# Patient Record
Sex: Female | Born: 1965 | Race: White | Hispanic: No | Smoking: Current every day smoker
Health system: Southern US, Community
[De-identification: ages and names within clinical notes are randomized; demographics above are authoritative.]

## PROBLEM LIST (undated history)

## (undated) DIAGNOSIS — K219 Gastro-esophageal reflux disease without esophagitis: Secondary | ICD-10-CM

## (undated) DIAGNOSIS — R55 Syncope and collapse: Secondary | ICD-10-CM

## (undated) DIAGNOSIS — G250 Essential tremor: Secondary | ICD-10-CM

## (undated) DIAGNOSIS — F32A Depression, unspecified: Secondary | ICD-10-CM

## (undated) DIAGNOSIS — T7840XA Allergy, unspecified, initial encounter: Secondary | ICD-10-CM

## (undated) DIAGNOSIS — C801 Malignant (primary) neoplasm, unspecified: Secondary | ICD-10-CM

## (undated) DIAGNOSIS — Z85828 Personal history of other malignant neoplasm of skin: Secondary | ICD-10-CM

## (undated) DIAGNOSIS — F419 Anxiety disorder, unspecified: Secondary | ICD-10-CM

## (undated) DIAGNOSIS — I10 Essential (primary) hypertension: Secondary | ICD-10-CM

## (undated) DIAGNOSIS — E785 Hyperlipidemia, unspecified: Secondary | ICD-10-CM

## (undated) DIAGNOSIS — D0472 Carcinoma in situ of skin of left lower limb, including hip: Secondary | ICD-10-CM

## (undated) DIAGNOSIS — F329 Major depressive disorder, single episode, unspecified: Secondary | ICD-10-CM

## (undated) HISTORY — DX: Allergy, unspecified, initial encounter: T78.40XA

## (undated) HISTORY — DX: Gastro-esophageal reflux disease without esophagitis: K21.9

## (undated) HISTORY — DX: Hyperlipidemia, unspecified: E78.5

## (undated) HISTORY — PX: OTHER SURGICAL HISTORY: SHX169

## (undated) HISTORY — DX: Essential tremor: G25.0

## (undated) HISTORY — DX: Syncope and collapse: R55

## (undated) HISTORY — DX: Anxiety disorder, unspecified: F41.9

## (undated) HISTORY — DX: Depression, unspecified: F32.A

## (undated) HISTORY — DX: Essential (primary) hypertension: I10

## (undated) HISTORY — DX: Major depressive disorder, single episode, unspecified: F32.9

## (undated) HISTORY — DX: Personal history of other malignant neoplasm of skin: Z85.828

## (undated) HISTORY — DX: Carcinoma in situ of skin of left lower limb, including hip: D04.72

---

## 2002-03-16 ENCOUNTER — Other Ambulatory Visit: Admission: RE | Admit: 2002-03-16 | Discharge: 2002-03-16 | Payer: Self-pay | Admitting: Internal Medicine

## 2003-05-24 ENCOUNTER — Encounter: Payer: Self-pay | Admitting: Internal Medicine

## 2004-08-25 ENCOUNTER — Other Ambulatory Visit: Admission: RE | Admit: 2004-08-25 | Discharge: 2004-08-25 | Payer: Self-pay | Admitting: Internal Medicine

## 2004-09-28 ENCOUNTER — Ambulatory Visit: Payer: Self-pay | Admitting: Internal Medicine

## 2005-02-17 ENCOUNTER — Ambulatory Visit: Payer: Self-pay | Admitting: Internal Medicine

## 2005-03-10 ENCOUNTER — Ambulatory Visit: Payer: Self-pay | Admitting: Internal Medicine

## 2005-03-24 ENCOUNTER — Ambulatory Visit: Payer: Self-pay | Admitting: Internal Medicine

## 2005-09-24 ENCOUNTER — Ambulatory Visit: Payer: Self-pay | Admitting: Internal Medicine

## 2005-10-01 ENCOUNTER — Ambulatory Visit: Payer: Self-pay | Admitting: Internal Medicine

## 2006-01-06 ENCOUNTER — Ambulatory Visit: Payer: Self-pay | Admitting: Family Medicine

## 2006-01-24 ENCOUNTER — Ambulatory Visit: Payer: Self-pay | Admitting: Internal Medicine

## 2006-03-31 ENCOUNTER — Ambulatory Visit: Payer: Self-pay | Admitting: Internal Medicine

## 2006-03-31 ENCOUNTER — Other Ambulatory Visit: Admission: RE | Admit: 2006-03-31 | Discharge: 2006-03-31 | Payer: Self-pay | Admitting: Internal Medicine

## 2006-03-31 LAB — CONVERTED CEMR LAB: Pap Smear: NORMAL

## 2006-04-29 ENCOUNTER — Ambulatory Visit: Payer: Self-pay | Admitting: Internal Medicine

## 2006-05-05 ENCOUNTER — Ambulatory Visit: Payer: Self-pay | Admitting: Internal Medicine

## 2006-05-05 HISTORY — PX: ESOPHAGOGASTRODUODENOSCOPY: SHX1529

## 2006-07-06 ENCOUNTER — Emergency Department: Payer: Self-pay | Admitting: Emergency Medicine

## 2006-07-06 ENCOUNTER — Other Ambulatory Visit: Payer: Self-pay

## 2006-07-13 ENCOUNTER — Ambulatory Visit: Payer: Self-pay | Admitting: Internal Medicine

## 2006-10-03 ENCOUNTER — Ambulatory Visit: Payer: Self-pay | Admitting: Internal Medicine

## 2006-11-02 ENCOUNTER — Ambulatory Visit: Payer: Self-pay | Admitting: Internal Medicine

## 2007-04-03 ENCOUNTER — Ambulatory Visit: Payer: Self-pay | Admitting: Internal Medicine

## 2007-04-03 DIAGNOSIS — K219 Gastro-esophageal reflux disease without esophagitis: Secondary | ICD-10-CM | POA: Insufficient documentation

## 2007-04-03 DIAGNOSIS — J309 Allergic rhinitis, unspecified: Secondary | ICD-10-CM | POA: Insufficient documentation

## 2007-04-03 DIAGNOSIS — F329 Major depressive disorder, single episode, unspecified: Secondary | ICD-10-CM | POA: Insufficient documentation

## 2007-04-03 DIAGNOSIS — G43009 Migraine without aura, not intractable, without status migrainosus: Secondary | ICD-10-CM | POA: Insufficient documentation

## 2007-04-03 DIAGNOSIS — I10 Essential (primary) hypertension: Secondary | ICD-10-CM | POA: Insufficient documentation

## 2007-05-03 ENCOUNTER — Telehealth (INDEPENDENT_AMBULATORY_CARE_PROVIDER_SITE_OTHER): Payer: Self-pay | Admitting: *Deleted

## 2007-07-14 ENCOUNTER — Telehealth (INDEPENDENT_AMBULATORY_CARE_PROVIDER_SITE_OTHER): Payer: Self-pay | Admitting: *Deleted

## 2007-11-28 ENCOUNTER — Telehealth (INDEPENDENT_AMBULATORY_CARE_PROVIDER_SITE_OTHER): Payer: Self-pay | Admitting: *Deleted

## 2007-12-01 ENCOUNTER — Telehealth (INDEPENDENT_AMBULATORY_CARE_PROVIDER_SITE_OTHER): Payer: Self-pay | Admitting: *Deleted

## 2007-12-20 ENCOUNTER — Other Ambulatory Visit: Admission: RE | Admit: 2007-12-20 | Discharge: 2007-12-20 | Payer: Self-pay | Admitting: Internal Medicine

## 2007-12-20 ENCOUNTER — Encounter: Payer: Self-pay | Admitting: Internal Medicine

## 2007-12-20 ENCOUNTER — Ambulatory Visit: Payer: Self-pay | Admitting: Internal Medicine

## 2007-12-20 DIAGNOSIS — E785 Hyperlipidemia, unspecified: Secondary | ICD-10-CM | POA: Insufficient documentation

## 2007-12-20 DIAGNOSIS — L570 Actinic keratosis: Secondary | ICD-10-CM | POA: Insufficient documentation

## 2007-12-22 ENCOUNTER — Encounter (INDEPENDENT_AMBULATORY_CARE_PROVIDER_SITE_OTHER): Payer: Self-pay | Admitting: *Deleted

## 2007-12-22 ENCOUNTER — Encounter: Payer: Self-pay | Admitting: Internal Medicine

## 2007-12-22 LAB — CONVERTED CEMR LAB: Pap Smear: NORMAL

## 2007-12-26 ENCOUNTER — Encounter (INDEPENDENT_AMBULATORY_CARE_PROVIDER_SITE_OTHER): Payer: Self-pay | Admitting: *Deleted

## 2008-01-29 ENCOUNTER — Telehealth (INDEPENDENT_AMBULATORY_CARE_PROVIDER_SITE_OTHER): Payer: Self-pay | Admitting: *Deleted

## 2008-01-31 ENCOUNTER — Encounter: Payer: Self-pay | Admitting: Internal Medicine

## 2008-03-22 ENCOUNTER — Telehealth (INDEPENDENT_AMBULATORY_CARE_PROVIDER_SITE_OTHER): Payer: Self-pay | Admitting: *Deleted

## 2008-03-22 HISTORY — PX: LAPAROSCOPIC CHOLECYSTECTOMY: SUR755

## 2008-04-02 ENCOUNTER — Ambulatory Visit: Payer: Self-pay | Admitting: Internal Medicine

## 2008-04-04 ENCOUNTER — Telehealth (INDEPENDENT_AMBULATORY_CARE_PROVIDER_SITE_OTHER): Payer: Self-pay | Admitting: *Deleted

## 2008-04-10 ENCOUNTER — Encounter (INDEPENDENT_AMBULATORY_CARE_PROVIDER_SITE_OTHER): Payer: Self-pay | Admitting: *Deleted

## 2008-05-07 ENCOUNTER — Telehealth (INDEPENDENT_AMBULATORY_CARE_PROVIDER_SITE_OTHER): Payer: Self-pay | Admitting: *Deleted

## 2008-05-20 ENCOUNTER — Telehealth (INDEPENDENT_AMBULATORY_CARE_PROVIDER_SITE_OTHER): Payer: Self-pay | Admitting: *Deleted

## 2008-05-20 ENCOUNTER — Telehealth: Payer: Self-pay | Admitting: Family Medicine

## 2008-06-10 ENCOUNTER — Telehealth (INDEPENDENT_AMBULATORY_CARE_PROVIDER_SITE_OTHER): Payer: Self-pay | Admitting: *Deleted

## 2008-06-19 ENCOUNTER — Ambulatory Visit: Payer: Self-pay | Admitting: Internal Medicine

## 2008-07-25 ENCOUNTER — Telehealth: Payer: Self-pay | Admitting: Internal Medicine

## 2008-09-13 ENCOUNTER — Telehealth: Payer: Self-pay | Admitting: Internal Medicine

## 2008-09-23 ENCOUNTER — Telehealth: Payer: Self-pay | Admitting: Internal Medicine

## 2008-10-24 ENCOUNTER — Telehealth: Payer: Self-pay | Admitting: Internal Medicine

## 2008-11-06 ENCOUNTER — Telehealth: Payer: Self-pay | Admitting: Internal Medicine

## 2008-11-28 ENCOUNTER — Telehealth: Payer: Self-pay | Admitting: Family Medicine

## 2008-12-24 ENCOUNTER — Ambulatory Visit: Payer: Self-pay | Admitting: Internal Medicine

## 2008-12-24 ENCOUNTER — Other Ambulatory Visit: Admission: RE | Admit: 2008-12-24 | Discharge: 2008-12-24 | Payer: Self-pay | Admitting: Internal Medicine

## 2008-12-24 ENCOUNTER — Encounter: Payer: Self-pay | Admitting: Internal Medicine

## 2008-12-24 LAB — CONVERTED CEMR LAB
Albumin: 3.6 g/dL (ref 3.5–5.2)
BUN: 8 mg/dL (ref 6–23)
CO2: 27 meq/L (ref 19–32)
Calcium: 8.9 mg/dL (ref 8.4–10.5)
Chloride: 109 meq/L (ref 96–112)
Creatinine, Ser: 0.8 mg/dL (ref 0.4–1.2)
GFR calc Af Amer: 101 mL/min
GFR calc non Af Amer: 83 mL/min
Glucose, Bld: 93 mg/dL (ref 70–99)
Phosphorus: 3 mg/dL (ref 2.3–4.6)
Potassium: 3.8 meq/L (ref 3.5–5.1)
Sodium: 140 meq/L (ref 135–145)

## 2008-12-24 LAB — HM PAP SMEAR

## 2008-12-26 ENCOUNTER — Encounter: Payer: Self-pay | Admitting: Internal Medicine

## 2009-01-10 ENCOUNTER — Telehealth: Payer: Self-pay | Admitting: Family Medicine

## 2009-02-03 ENCOUNTER — Telehealth: Payer: Self-pay | Admitting: Family Medicine

## 2009-02-10 ENCOUNTER — Telehealth: Payer: Self-pay | Admitting: Internal Medicine

## 2009-04-07 ENCOUNTER — Telehealth: Payer: Self-pay | Admitting: Internal Medicine

## 2009-04-28 ENCOUNTER — Telehealth: Payer: Self-pay | Admitting: Internal Medicine

## 2009-06-05 ENCOUNTER — Telehealth: Payer: Self-pay | Admitting: Internal Medicine

## 2009-06-30 ENCOUNTER — Encounter: Payer: Self-pay | Admitting: Internal Medicine

## 2009-07-01 ENCOUNTER — Telehealth: Payer: Self-pay | Admitting: Internal Medicine

## 2009-07-01 ENCOUNTER — Ambulatory Visit: Payer: Self-pay | Admitting: Internal Medicine

## 2009-07-01 LAB — CONVERTED CEMR LAB
ALT: 20 units/L (ref 0–35)
AST: 18 units/L (ref 0–37)
Albumin: 3.8 g/dL (ref 3.5–5.2)
Alkaline Phosphatase: 57 units/L (ref 39–117)
BUN: 12 mg/dL (ref 6–23)
Basophils Absolute: 0 10*3/uL (ref 0.0–0.1)
Basophils Relative: 0.1 % (ref 0.0–3.0)
Bilirubin, Direct: 0.1 mg/dL (ref 0.0–0.3)
CO2: 28 meq/L (ref 19–32)
Calcium: 9.1 mg/dL (ref 8.4–10.5)
Chloride: 107 meq/L (ref 96–112)
Creatinine, Ser: 0.8 mg/dL (ref 0.4–1.2)
Eosinophils Absolute: 0.2 10*3/uL (ref 0.0–0.7)
Eosinophils Relative: 1.7 % (ref 0.0–5.0)
Glucose, Bld: 88 mg/dL (ref 70–99)
HCT: 41.6 % (ref 36.0–46.0)
Hemoglobin: 14.5 g/dL (ref 12.0–15.0)
Lymphocytes Relative: 24.8 % (ref 12.0–46.0)
Lymphs Abs: 2.9 10*3/uL (ref 0.7–4.0)
MCHC: 34.9 g/dL (ref 30.0–36.0)
MCV: 87.3 fL (ref 78.0–100.0)
Monocytes Absolute: 0.6 10*3/uL (ref 0.1–1.0)
Monocytes Relative: 5.3 % (ref 3.0–12.0)
Neutro Abs: 7.9 10*3/uL — ABNORMAL HIGH (ref 1.4–7.7)
Neutrophils Relative %: 68.1 % (ref 43.0–77.0)
Phosphorus: 3.6 mg/dL (ref 2.3–4.6)
Platelets: 363 10*3/uL (ref 150.0–400.0)
Potassium: 4.8 meq/L (ref 3.5–5.1)
RBC: 4.77 M/uL (ref 3.87–5.11)
RDW: 12.8 % (ref 11.5–14.6)
Sodium: 140 meq/L (ref 135–145)
TSH: 0.94 microintl units/mL (ref 0.35–5.50)
Total Bilirubin: 0.5 mg/dL (ref 0.3–1.2)
Total Protein: 6.7 g/dL (ref 6.0–8.3)
WBC: 11.6 10*3/uL — ABNORMAL HIGH (ref 4.5–10.5)

## 2009-07-03 ENCOUNTER — Telehealth: Payer: Self-pay | Admitting: Internal Medicine

## 2009-07-11 ENCOUNTER — Telehealth: Payer: Self-pay | Admitting: Family Medicine

## 2009-08-15 ENCOUNTER — Telehealth: Payer: Self-pay | Admitting: Internal Medicine

## 2009-09-24 ENCOUNTER — Telehealth: Payer: Self-pay | Admitting: Internal Medicine

## 2009-10-29 ENCOUNTER — Telehealth: Payer: Self-pay | Admitting: Internal Medicine

## 2009-11-26 ENCOUNTER — Telehealth: Payer: Self-pay | Admitting: Family Medicine

## 2009-12-03 ENCOUNTER — Telehealth: Payer: Self-pay | Admitting: Internal Medicine

## 2009-12-18 ENCOUNTER — Ambulatory Visit: Payer: Self-pay | Admitting: Internal Medicine

## 2010-01-26 ENCOUNTER — Telehealth: Payer: Self-pay | Admitting: Family Medicine

## 2010-02-09 ENCOUNTER — Ambulatory Visit: Payer: Self-pay | Admitting: Internal Medicine

## 2010-02-19 ENCOUNTER — Telehealth: Payer: Self-pay | Admitting: Internal Medicine

## 2010-02-25 ENCOUNTER — Telehealth: Payer: Self-pay | Admitting: Internal Medicine

## 2010-03-31 ENCOUNTER — Telehealth: Payer: Self-pay | Admitting: Internal Medicine

## 2010-05-14 ENCOUNTER — Ambulatory Visit: Payer: Self-pay | Admitting: Internal Medicine

## 2010-05-14 DIAGNOSIS — F438 Other reactions to severe stress: Secondary | ICD-10-CM | POA: Insufficient documentation

## 2010-07-17 ENCOUNTER — Telehealth: Payer: Self-pay | Admitting: Internal Medicine

## 2010-09-09 ENCOUNTER — Telehealth: Payer: Self-pay | Admitting: Internal Medicine

## 2010-09-11 ENCOUNTER — Ambulatory Visit: Payer: Self-pay | Admitting: Internal Medicine

## 2010-09-22 ENCOUNTER — Telehealth: Payer: Self-pay | Admitting: Family Medicine

## 2010-09-22 ENCOUNTER — Telehealth: Payer: Self-pay | Admitting: Internal Medicine

## 2010-09-22 ENCOUNTER — Encounter: Payer: Self-pay | Admitting: Family Medicine

## 2010-09-22 LAB — HM MAMMOGRAPHY: HM Mammogram: NORMAL

## 2010-09-24 ENCOUNTER — Telehealth (INDEPENDENT_AMBULATORY_CARE_PROVIDER_SITE_OTHER): Payer: Self-pay | Admitting: *Deleted

## 2010-09-25 ENCOUNTER — Ambulatory Visit: Payer: Self-pay | Admitting: Family Medicine

## 2010-09-28 ENCOUNTER — Telehealth: Payer: Self-pay | Admitting: Internal Medicine

## 2010-09-28 LAB — CONVERTED CEMR LAB
BUN: 15 mg/dL (ref 6–23)
CO2: 27 meq/L (ref 19–32)
Calcium: 9.5 mg/dL (ref 8.4–10.5)
Chloride: 106 meq/L (ref 96–112)
Creatinine, Ser: 0.9 mg/dL (ref 0.4–1.2)
GFR calc non Af Amer: 76.88 mL/min (ref >60)
Glucose, Bld: 112 mg/dL — ABNORMAL HIGH (ref 70–99)
Potassium: 4.2 meq/L (ref 3.5–5.1)
Sodium: 139 meq/L (ref 135–145)

## 2010-10-07 ENCOUNTER — Encounter: Admission: RE | Admit: 2010-10-07 | Discharge: 2010-10-07 | Payer: Self-pay | Admitting: Internal Medicine

## 2010-10-07 ENCOUNTER — Encounter: Payer: Self-pay | Admitting: Family Medicine

## 2010-11-06 ENCOUNTER — Ambulatory Visit: Payer: Self-pay | Admitting: Family Medicine

## 2010-11-09 ENCOUNTER — Encounter: Payer: Self-pay | Admitting: Internal Medicine

## 2010-11-09 ENCOUNTER — Ambulatory Visit: Payer: Self-pay | Admitting: Family Medicine

## 2010-11-09 LAB — CONVERTED CEMR LAB
ALT: 13 units/L (ref 0–35)
AST: 15 units/L (ref 0–37)
Albumin: 3.9 g/dL (ref 3.5–5.2)
Alkaline Phosphatase: 67 units/L (ref 39–117)
Basophils Absolute: 0.2 10*3/uL — ABNORMAL HIGH (ref 0.0–0.1)
Basophils Relative: 1.5 % (ref 0.0–3.0)
Bilirubin, Direct: 0.1 mg/dL (ref 0.0–0.3)
Eosinophils Absolute: 0.3 10*3/uL (ref 0.0–0.7)
Eosinophils Relative: 2.6 % (ref 0.0–5.0)
H Pylori IgG: NEGATIVE
HCT: 42.8 % (ref 36.0–46.0)
Hemoglobin: 14.5 g/dL (ref 12.0–15.0)
Lipase: 30 units/L (ref 11.0–59.0)
Lymphocytes Relative: 23.7 % (ref 12.0–46.0)
Lymphs Abs: 2.8 10*3/uL (ref 0.7–4.0)
MCHC: 34 g/dL (ref 30.0–36.0)
MCV: 90 fL (ref 78.0–100.0)
Monocytes Absolute: 0.6 10*3/uL (ref 0.1–1.0)
Monocytes Relative: 5.2 % (ref 3.0–12.0)
Neutro Abs: 7.9 10*3/uL — ABNORMAL HIGH (ref 1.4–7.7)
Neutrophils Relative %: 67 % (ref 43.0–77.0)
Platelets: 368 10*3/uL (ref 150.0–400.0)
RBC: 4.75 M/uL (ref 3.87–5.11)
RDW: 13.2 % (ref 11.5–14.6)
Total Bilirubin: 0.5 mg/dL (ref 0.3–1.2)
Total Protein: 6.8 g/dL (ref 6.0–8.3)
WBC: 11.8 10*3/uL — ABNORMAL HIGH (ref 4.5–10.5)

## 2010-11-25 ENCOUNTER — Telehealth: Payer: Self-pay | Admitting: Internal Medicine

## 2010-12-14 ENCOUNTER — Telehealth: Payer: Self-pay | Admitting: Internal Medicine

## 2010-12-20 LAB — CONVERTED CEMR LAB
ALT: 19 units/L (ref 0–35)
AST: 17 units/L (ref 0–37)
Albumin: 3.7 g/dL (ref 3.5–5.2)
Albumin: 3.7 g/dL (ref 3.5–5.2)
Alkaline Phosphatase: 56 units/L (ref 39–117)
Amylase: 79 units/L (ref 27–131)
BUN: 11 mg/dL (ref 6–23)
BUN: 9 mg/dL (ref 6–23)
Basophils Absolute: 0.1 10*3/uL (ref 0.0–0.1)
Basophils Absolute: 0.1 10*3/uL (ref 0.0–0.1)
Basophils Relative: 0.8 % (ref 0.0–1.0)
Basophils Relative: 0.9 % (ref 0.0–1.0)
Bilirubin, Direct: 0.1 mg/dL (ref 0.0–0.3)
CO2: 29 meq/L (ref 19–32)
CO2: 30 meq/L (ref 19–32)
Calcium: 9.2 mg/dL (ref 8.4–10.5)
Calcium: 9.3 mg/dL (ref 8.4–10.5)
Chloride: 104 meq/L (ref 96–112)
Chloride: 109 meq/L (ref 96–112)
Cholesterol: 213 mg/dL (ref 0–200)
Creatinine, Ser: 0.9 mg/dL (ref 0.4–1.2)
Creatinine, Ser: 0.9 mg/dL (ref 0.4–1.2)
Direct LDL: 154.6 mg/dL
Eosinophils Absolute: 0.1 10*3/uL (ref 0.0–0.7)
Eosinophils Absolute: 0.2 10*3/uL (ref 0.0–0.6)
Eosinophils Relative: 1.7 % (ref 0.0–5.0)
Eosinophils Relative: 2.8 % (ref 0.0–5.0)
GFR calc Af Amer: 88 mL/min
GFR calc Af Amer: 88 mL/min
GFR calc non Af Amer: 73 mL/min
GFR calc non Af Amer: 73 mL/min
Glucose, Bld: 83 mg/dL (ref 70–99)
Glucose, Bld: 98 mg/dL (ref 70–99)
H Pylori IgG: NEGATIVE
HCT: 41.3 % (ref 36.0–46.0)
HCT: 42.7 % (ref 36.0–46.0)
HDL: 41.9 mg/dL (ref >39.0)
Hemoglobin: 13.5 g/dL (ref 12.0–15.0)
Hemoglobin: 14.3 g/dL (ref 12.0–15.0)
Lipase: 28 units/L (ref 11.0–59.0)
Lymphocytes Relative: 24.5 % (ref 12.0–46.0)
Lymphocytes Relative: 25.4 % (ref 12.0–46.0)
MCHC: 32.8 g/dL (ref 30.0–36.0)
MCHC: 33.5 g/dL (ref 30.0–36.0)
MCV: 87.9 fL (ref 78.0–100.0)
MCV: 88.4 fL (ref 78.0–100.0)
Monocytes Absolute: 0.5 10*3/uL (ref 0.1–1.0)
Monocytes Absolute: 0.6 10*3/uL (ref 0.2–0.7)
Monocytes Relative: 6.3 % (ref 3.0–12.0)
Monocytes Relative: 7.2 % (ref 3.0–11.0)
Neutro Abs: 5.4 10*3/uL (ref 1.4–7.7)
Neutro Abs: 5.8 10*3/uL (ref 1.4–7.7)
Neutrophils Relative %: 63.8 % (ref 43.0–77.0)
Neutrophils Relative %: 66.6 % (ref 43.0–77.0)
Phosphorus: 2.9 mg/dL (ref 2.3–4.6)
Phosphorus: 3.6 mg/dL (ref 2.3–4.6)
Platelets: 347 10*3/uL (ref 150–400)
Platelets: 373 10*3/uL (ref 150–400)
Potassium: 4.1 meq/L (ref 3.5–5.1)
Potassium: 4.5 meq/L (ref 3.5–5.1)
RBC: 4.67 M/uL (ref 3.87–5.11)
RBC: 4.86 M/uL (ref 3.87–5.11)
RDW: 12.8 % (ref 11.5–14.6)
RDW: 12.9 % (ref 11.5–14.6)
Sodium: 139 meq/L (ref 135–145)
Sodium: 141 meq/L (ref 135–145)
TSH: 1.15 microintl units/mL (ref 0.35–5.50)
Total Bilirubin: 0.4 mg/dL (ref 0.3–1.2)
Total CHOL/HDL Ratio: 5.1
Total Protein: 6.8 g/dL (ref 6.0–8.3)
Triglycerides: 47 mg/dL (ref 0–149)
VLDL: 9 mg/dL (ref 0–40)
WBC: 8.5 10*3/uL (ref 4.5–10.5)
WBC: 8.6 10*3/uL (ref 4.5–10.5)

## 2010-12-22 ENCOUNTER — Other Ambulatory Visit: Payer: Self-pay | Admitting: Internal Medicine

## 2010-12-22 ENCOUNTER — Encounter: Payer: Self-pay | Admitting: Internal Medicine

## 2010-12-22 ENCOUNTER — Ambulatory Visit
Admission: RE | Admit: 2010-12-22 | Discharge: 2010-12-22 | Payer: Self-pay | Source: Home / Self Care | Attending: Internal Medicine | Admitting: Internal Medicine

## 2010-12-22 LAB — CBC WITH DIFFERENTIAL/PLATELET
Basophils Absolute: 0 10*3/uL (ref 0.0–0.1)
Basophils Relative: 0.3 % (ref 0.0–3.0)
Eosinophils Absolute: 0.2 10*3/uL (ref 0.0–0.7)
Eosinophils Relative: 1.6 % (ref 0.0–5.0)
HCT: 42.2 % (ref 36.0–46.0)
Hemoglobin: 14.7 g/dL (ref 12.0–15.0)
Lymphocytes Relative: 21.8 % (ref 12.0–46.0)
Lymphs Abs: 2.7 10*3/uL (ref 0.7–4.0)
MCHC: 34.8 g/dL (ref 30.0–36.0)
MCV: 89.2 fl (ref 78.0–100.0)
Monocytes Absolute: 0.8 10*3/uL (ref 0.1–1.0)
Monocytes Relative: 6 % (ref 3.0–12.0)
Neutro Abs: 8.9 10*3/uL — ABNORMAL HIGH (ref 1.4–7.7)
Neutrophils Relative %: 70.3 % (ref 43.0–77.0)
Platelets: 351 10*3/uL (ref 150.0–400.0)
RBC: 4.73 Mil/uL (ref 3.87–5.11)
RDW: 13.8 % (ref 11.5–14.6)
WBC: 12.6 10*3/uL — ABNORMAL HIGH (ref 4.5–10.5)

## 2010-12-22 LAB — LIPID PANEL
Cholesterol: 231 mg/dL — ABNORMAL HIGH (ref 0–200)
HDL: 43 mg/dL (ref >39.00)
Total CHOL/HDL Ratio: 5
Triglycerides: 110 mg/dL (ref 0.0–149.0)
VLDL: 22 mg/dL (ref 0.0–40.0)

## 2010-12-22 LAB — HEPATIC FUNCTION PANEL
ALT: 64 U/L — ABNORMAL HIGH (ref 0–35)
AST: 53 U/L — ABNORMAL HIGH (ref 0–37)
Albumin: 3.9 g/dL (ref 3.5–5.2)
Alkaline Phosphatase: 85 U/L (ref 39–117)
Bilirubin, Direct: 0.2 mg/dL (ref 0.0–0.3)
Total Bilirubin: 0.4 mg/dL (ref 0.3–1.2)
Total Protein: 7.1 g/dL (ref 6.0–8.3)

## 2010-12-22 LAB — RENAL FUNCTION PANEL
Albumin: 3.9 g/dL (ref 3.5–5.2)
BUN: 9 mg/dL (ref 6–23)
CO2: 31 mEq/L (ref 19–32)
Calcium: 9.2 mg/dL (ref 8.4–10.5)
Chloride: 103 mEq/L (ref 96–112)
Creatinine, Ser: 0.8 mg/dL (ref 0.4–1.2)
GFR: 82.36 mL/min (ref >60.00)
Glucose, Bld: 87 mg/dL (ref 70–99)
Phosphorus: 3.2 mg/dL (ref 2.3–4.6)
Potassium: 4.3 mEq/L (ref 3.5–5.1)
Sodium: 140 mEq/L (ref 135–145)

## 2010-12-22 LAB — TSH: TSH: 1.2 u[IU]/mL (ref 0.35–5.50)

## 2010-12-22 LAB — LDL CHOLESTEROL, DIRECT: Direct LDL: 178.7 mg/dL

## 2010-12-22 NOTE — Progress Notes (Signed)
Summary: Vicodin  Phone Note [facility] 2008 8:24 AM  Refills Requested: Medication #1:  VICODIN 5-500 MG TABS 1 four times daily as needed severe pain   Last Refilled: 10/24/2008 e-scribe [facility] [phone]   Method Requested: Telephone to Pharmacy Initial call taken by: Mervin Hack CMA,  November 28, 2008 8:25 AM  Follow-up for Phone Call        per prescription in computer last rx sent on 12/09 and she had one refill. Has she already refilled so soon?  120 tabs in 1 month?  If so this nbeeds to await Dr. Karle Starch return.  Follow-up by: Kerby Nora MD,  November 28, 2008 8:31 AM  Additional Follow-up for Phone Call Additional follow up Details #1::        spoke with pharmacist, they didn't add the 1 refill, which they will do now, they made the mistake, Additional Follow-up by: Mervin Hack CMA,  November 28, 2008 9:11 AM

## 2010-12-22 NOTE — Progress Notes (Signed)
Summary: VICODIN  Phone Note Refill Request Message from:  [facility] [phone] on Mar 31, 2010 1:19 PM  Refills Requested: Medication #1:  VICODIN 5-500 MG TABS 1 four times daily as needed severe pain   Last Refilled: 02/23/2010 E-Scribe Request    Method Requested: Telephone to Pharmacy Initial call taken by: Mervin Hack CMA Duncan Dull),  Mar 31, 2010 1:19 PM  Follow-up for Phone Call        okay #60 x 0 Follow-up by: Cindee Salt MD,  Mar 31, 2010 1:56 PM  Additional Follow-up for Phone Call Additional follow up Details #1::        Rx called to pharmacy Additional Follow-up by: DeShannon Smith CMA Duncan Dull),  Mar 31, 2010 2:52 PM    Prescriptions: VICODIN 5-500 MG TABS (HYDROCODONE-ACETAMINOPHEN) 1 four times daily as needed severe pain  #60 x 0   Entered by:   Mervin Hack CMA (AAMA)   Authorized by:   Cindee Salt MD   Signed by:   Mervin Hack CMA (AAMA) on 03/31/2010   Method used:   Telephoned to ...       [facility] [address] [phone]* (retail)       [address]       [place], [place]  [address]       Ph: [phone]       Fax: [phone]   RxID:   [id number]

## 2010-12-22 NOTE — Assessment & Plan Note (Signed)
Summary: CHECK BREASTS, ONE IS LARGER   Vital Signs:  Patient profile:   45 year old female Weight:      145.25 pounds Temp:     98.4 degrees F oral Pulse rate:   84 / minute Pulse rhythm:   regular BP sitting:   140 / 100  (left arm) Cuff size:   regular  Vitals Entered By: Selena Batten Dance CMA Duncan Dull) (September 11, 2010 3:37 PM) CC: Check breasts (Asymmetrical)  Comments **has not had B/P meds today   History of Present Illness: CC: check breasts  L breast is bigger than right.  Noticed now for 2-3 wks.  No nipple discharge. No pain in breasts.  very intermittent sharp pain left breast occasionally, not associated with cycle.  Hasn't noticed any lumps or bumps (doesn't check).  No changes in exercises.  no skin changes.  Last complete physical a few years back.  Last mammo a few years back, due.  on OCP, no chance could be pregnant (LMP 09/04/2010).  h/o HTN - didn't take meds this am  husband smokes, inside.  Pt quit smoking 1987.  No fmhx BRCA.   main concern is that she has breast cancer because of everything in news this month.  -  Date:  01/31/2008    Mammogram Birads-1  Current Medications (verified): 1)  Lo/ovral (28) 0.3-30 Mg-Mcg Tabs (Norgestrel-Ethinyl Estradiol) .... Take One By Mouth Daily 2)  Fluoxetine Hcl 40 Mg Caps (Fluoxetine Hcl) .... Take One By Mouth Daily 3)  Trazodone Hcl 50 Mg Tabs (Trazodone Hcl) .... 2-3 At Bedtime Nightly To Help Sleep 4)  Amlodipine Besylate 5 Mg Tabs (Amlodipine Besylate) .... Take 1 Tablet By Mouth Once A Day 5)  Vicodin 5-500 Mg Tabs (Hydrocodone-Acetaminophen) .Marland Kitchen.. 1 Four Times Daily As Needed Severe Pain 6)  Prilosec 20 Mg  Cpdr (Omeprazole) .... Take 1 Tablet By Mouth Two Times A Day 7)  Fexofenadine Hcl 180 Mg  Tabs (Fexofenadine Hcl) .... Take 1 Tablet By Mouth Once Daily As Needed 8)  Lorazepam 1 Mg Tabs (Lorazepam) .... Take 1 By Mouth Two Times A Day As Needed Nerves  Allergies: 1)  ! 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   Chest Wall:  No deformities, masses, or tenderness noted. Breasts:  No mass, nodules, thickening, tenderness, bulging, retraction, inflamation, nipple discharge or skin changes noted.  No axillary LAD.  L breast tissue larger than right.   Impression & Recommendations:  Problem # 1:  BREAST HYPERTROPHY (ICD-611.1) reassured.  exam WNL.  L breast is larger.  no erythema or evidence of blocked duct/infection/inflammation.  due for mammogram, set up for that (screening only). rec return if any questions.  discussed sometimes breast tissue becomes more sensitive to female hormone levels (E, P, prolactin).  if continued, consider checking prolactin level, Upreg  Problem # 2:  OTHER SCREENING MAMMOGRAM (ICD-V76.12) due.  schedule appt.  last mammo 01/2008, normal.  Orders: Radiology Referral (Radiology)  Complete Medication List: 1)  Lo/ovral (28) 0.3-30 Mg-mcg Tabs (Norgestrel-ethinyl estradiol) .... Take one by mouth daily 2)  Fluoxetine Hcl 40  Mg Caps (Fluoxetine hcl) .... Take one by mouth daily 3)  Trazodone Hcl 50 Mg Tabs (Trazodone hcl) .... 2-3 at bedtime nightly to help sleep 4)  Amlodipine Besylate 5 Mg Tabs (Amlodipine besylate) .... Take 1 tablet by mouth once a day 5)  Vicodin 5-500 Mg Tabs (Hydrocodone-acetaminophen) .Marland Kitchen.. 1 four times daily as needed severe pain 6)  Prilosec 20 Mg Cpdr (Omeprazole) .... Take 1 tablet by mouth two times a day 7)  Fexofenadine Hcl 180 Mg Tabs (Fexofenadine hcl) .... Take 1 tablet by  mouth once daily as needed 8)  Lorazepam 1 Mg Tabs (Lorazepam) .... Take 1 by mouth two times a day as needed nerves  Patient Instructions: 1)  exam is looking normal today. 2)  you are due for mammogram so we will set you up for that. 3)  Call clinic with questions.  Good to see you today.   Orders Added: 1)  Est. Patient Level III [01601] 2)  Radiology Referral [Radiology]    Current Allergies (reviewed today): ! * WELLBUTRIN XL * EFFEXOR REMERON (MIRTAZAPINE) LISINOPRIL (LISINOPRIL)  Prevention & Chronic Care Immunizations   Influenza vaccine: Not documented    Tetanus booster: 12/20/2007: Tdap    Pneumococcal vaccine: Not documented  Other Screening   Pap smear: NEGATIVE FOR INTRAEPITHELIAL LESIONS OR MALIGNANCY.  (12/24/2008)    Mammogram: Birads-1  (01/31/2008)   Smoking status: quit  (04/03/2007)  Lipids   Total Cholesterol: 213  (12/20/2007)   LDL: DEL  (12/20/2007)   LDL Direct: 154.6  (12/20/2007)   HDL: 41.9  (12/20/2007)   Triglycerides: 47  (12/20/2007)    SGOT (AST): 18  (07/01/2009)   SGPT (ALT): 20  (07/01/2009)   Alkaline phosphatase: 57  (07/01/2009)   Total bilirubin: 0.5  (07/01/2009)  Hypertension   Last Blood Pressure: 140 / 100  (09/11/2010)   Serum creatinine: 0.8  (07/01/2009)   Serum potassium 4.8  (07/01/2009)  Self-Management Support :    Hypertension self-management support: Not documented    Lipid self-management support: Not documented

## 2010-12-22 NOTE — Progress Notes (Signed)
Summary: LORAZEPAM  Phone Note Refill Request Message from:  [facility] [phone] on February 03, 2009 11:10 AM  Refills Requested: Medication #1:  LORAZEPAM 0.5 MG TABS Take 1/2-1 by mouth bid   Last Refilled: 11/28/2008 e-scribe request   Method Requested: Telephone to Pharmacy Initial call taken by: Mervin Hack CMA,  February 03, 2009 11:10 AM  Follow-up for Phone Call        ok, 1 month supply Follow-up by: Hannah Beat MD,  February 03, 2009 11:18 AM  Additional Follow-up for Phone Call Additional follow up Details #1::        Rx called to pharmacy Additional Follow-up by: Mervin Hack CMA,  February 03, 2009 11:37 AM      Prescriptions: LORAZEPAM 0.5 MG TABS (LORAZEPAM) Take 1/2-1 by mouth bid  #60 x 0   Entered by:   Mervin Hack CMA   Authorized by:   Hannah Beat MD   Signed by:   Mervin Hack CMA on 02/03/2009   Method used:   Telephoned to ...       [facility] [address] [phone]* (retail)       [address]       [place], [place]  [address]       Ph: [phone]       Fax: [phone]   RxID:   [id number]

## 2010-12-22 NOTE — Progress Notes (Signed)
Summary: REFILL LORAZEPAM  Phone Note Refill Request Message from:  [facility] on September 23, 2008 8:58 AM  Refills Requested: Medication #1:  LORAZEPAM 0.5 MG TABS Take 1/2-1 by mouth bid   Last Refilled: 07/25/2008 [facility] [address] [phone]  Initial call taken by: Providence Crosby,  September 23, 2008 8:58 AM  Follow-up for Phone Call        okay #60 x 1 Follow-up by: Cindee Salt MD,  September 23, 2008 5:12 PM  Additional Follow-up for Phone Call Additional follow up Details #1::        [facility] [place] Additional Follow-up by: Lowella Petties,  September 23, 2008 5:23 PM

## 2010-12-22 NOTE — Progress Notes (Signed)
----   Converted from flag ---- ---- 09/24/2010 11:09 AM, Eustaquio Boyden  MD wrote: please do BMP, 401.9 when patient arrives for blood work (tomorrow?) ------------------------------

## 2010-12-22 NOTE — Miscellaneous (Signed)
  Clinical Lists Changes  Observations: Added new observation of MAMMO DUE: 09/2011 (10/07/2010 16:34) Added new observation of MAMMOGRAM: normal (09/22/2010 16:34)      Preventive Care Screening  Mammogram:    Date:  09/22/2010    Next Due:  09/2011    Results:  normal

## 2010-12-22 NOTE — Progress Notes (Signed)
Summary: left breast is larger  Phone Note Call from Patient Call back at Home Phone [phone]   [name]: Patient Call For: Cindee Salt MD Summary of Call: Pt states that her left breast is larger than her right, she just noticed this last night.  I told her that's not unusual,  but she has never noticed it before.  She says she thinks its right much larger.  Has not had a mammogram in awhile.  Do you want me to work her in? Initial call taken by: Lowella Petties CMA,  September 09, 2010 2:18 PM  Follow-up for Phone Call        It doesn't sound overly worrisome but if she is concerned she probably should have someone evaluate her I think my tomorrow is full and I will only see up to 12:30PM on Friday shouldn't be a problem if she can wait till next week otherwise Friday or someone else Follow-up by: Cindee Salt MD,  September 09, 2010 2:22 PM  Additional Follow-up for Phone Call Additional follow up Details #1::        Advised pt, appt made. Additional Follow-up by: Lowella Petties CMA,  September 09, 2010 3:12 PM

## 2010-12-22 NOTE — Progress Notes (Signed)
Summary: VICODIN  Phone Note [facility]ill Request [facility]:  [facility] [phone] on September 28, 2010 9:31 AM  Refills Requested: Medication #1:  VICODIN 5-500 MG TABS 1 four times daily as needed severe pain   Last Refilled: 08/28/2010 E-Scribe Request    Method Requested: Telephone to Pharmacy Initial call taken by: Mervin Hack CMA Duncan Dull),  September 28, 2010 9:31 AM  Follow-up for Phone Call        okay #60 x 0 Follow-up by: Cindee Salt MD,  September 28, 2010 1:48 PM  Additional Follow-up for Phone Call Additional follow up Details #1::        Rx called to pharmacy Additional Follow-up by: DeShannon Katrinka Blazing CMA Duncan Dull),  September 28, 2010 2:52 PM    Prescriptions: VICODIN 5-500 MG TABS (HYDROCODONE-ACETAMINOPHEN) 1 four times daily as needed severe pain  #60 x 0   Entered by:   Mervin Hack CMA (AAMA)   Authorized by:   Cindee Salt MD   Signed by:   Mervin Hack CMA (AAMA) on 09/28/2010   Method used:   Telephoned to ...       [facility] [address] [phone]* (retail)       [address]       [place], [place]  [address]       Ph: [phone]       Fax: [phone]   RxID:   [id number]

## 2010-12-22 NOTE — Progress Notes (Signed)
Summary: LORAZEPAM  Phone Note Refill Request Message from:  [facility] [phone] on February 19, 2010 12:49 PM  Refills Requested: Medication #1:  LORAZEPAM 1 MG TABS take 1 by mouth two times a day as needed nerves.   Last Refilled: 12/03/2009 E-Scribe Request    Method Requested: Telephone to Pharmacy Initial call taken by: Mervin Hack CMA Duncan Dull),  February 19, 2010 12:49 PM  Follow-up for Phone Call        okay #60 x 1 Follow-up by: Cindee Salt MD,  February 19, 2010 1:25 PM  Additional Follow-up for Phone Call Additional follow up Details #1::        Rx called to pharmacy Additional Follow-up by: DeShannon Katrinka Blazing CMA Duncan Dull),  February 19, 2010 4:57 PM    Prescriptions: LORAZEPAM 1 MG TABS (LORAZEPAM) take 1 by mouth two times a day as needed nerves  #60 x 1   Entered by:   Mervin Hack CMA (AAMA)   Authorized by:   Cindee Salt MD   Signed by:   Mervin Hack CMA (AAMA) on 02/19/2010   Method used:   Telephoned to ...       [facility] [address] [phone]* (retail)       [address]       [place], [place]  [address]       Ph: [phone]       Fax: [phone]   RxID:   [id number]

## 2010-12-22 NOTE — Letter (Signed)
Summary: Results Follow up Letter  [place] at [facility]  [address] [place], [place] [address]   Phone: [phone]  Fax: [phone]    12/26/2008 MRN: [id number]  [facility] [address] [place], [place]  [address]  Dear Ms. [place],  The following are the results of your recent test(s):  Test         Result    Pap Smear:        Normal _X____  Not Normal _____ Comments: ______________________________________________________ Cholesterol: LDL(Bad cholesterol):         Your goal is less than:         HDL (Good cholesterol):       Your goal is more than: Comments:  ______________________________________________________ Mammogram:        Normal _____  Not Normal _____ Comments:  ___________________________________________________________________ Hemoccult:        Normal _____  Not normal _______ Comments:    _____________________________________________________________________ Other Tests:    We routinely do not discuss normal results over the telephone.  If you desire a copy of the results, or you have any questions about this information we can discuss them at your next office visit.   Sincerely,     Tillman Abide, MD

## 2010-12-22 NOTE — Progress Notes (Signed)
Summary: pt requests phone call  Phone Note Call from Patient Call back at [phone]   [name]: Patient Call For: Cindee Salt MD Summary of Call: Pt is asking that you call her, she knows you wont be in until tomorrow afternoon. Initial call taken by: Lowella Petties CMA,  February 25, 2010 4:55 PM  Follow-up for Phone Call        still very upset husband just had visit yesterday at [facility]  he came home very upset Knows he is going to die wonders if meds are even worthwhile talking to her about death and afraid of being alone she isn't suicidal but she asked if he wanted her to join him ---he said no, she has to be available for their grandchild   had chest pain and fluttering for about 10 minutes in bed last night after all this doesn't sound cardiac  advised more meds not likely to help needs more info about prognosis of the liver cancer from his doctors and access for both of them for counselling (if approp, maybe with hospice)  she will call back if ongoing problems Follow-up by: Cindee Salt MD,  February 26, 2010 1:32 PM

## 2010-12-22 NOTE — Progress Notes (Signed)
Summary: Rx-Fexofenadine  Phone Note Other Incoming   Call placed by: Silas [place],  May 20, 2008 9:19 AM Call placed to: Dr.Schaller Summary of Call: I sent over a refill request that was sent electronically on Fexofenadine 180mg  because it was not on med list. Lorazepam was sent in, in the place of this, but there was no refil request on Lorazepam. What to do? Initial call taken by: Silas [place],  May 20, 2008 9:21 AM  Follow-up for Phone Call        Please come talk to me about this. Follow-up by: Shaune Leeks MD,  May 20, 2008 10:04 AM  Additional Follow-up for Phone Call Additional follow up Details #1::        Called pharmacy to have them discontinue the electronic send on Lorazeapam and they said they didn't get it.  Additional Follow-up by: Silas [place],  May 20, 2008 10:32 AM

## 2010-12-22 NOTE — Letter (Signed)
Summary: [place] No Show Letter  [place] at [facility]  [address] [place], [place] [address]   Phone: [phone]  Fax: [phone]    04/10/2008 MRN: [id number]  [facility] [address] [place], [place]  [address]   Dear Ms. B[place]wick,   Our records indicate that you missed your scheduled appointment with __Dr. Letvak___________________ on __5/20/2009__________.  Please contact this office to reschedule your appointment as soon as possible.  It is important that you keep your scheduled appointments with your physician, so we can provide you the best care possible.  Please be advised that there may be a charge for "no show" appointments.    Sincerely,   [place] at [facility]

## 2010-12-22 NOTE — Progress Notes (Signed)
Summary: rx- trazadone  Phone Note [facility]ill Request [facility]:  [facility] on November 06, 2008 8:15 AM  Refills Requested: Medication #1:  TRAZODONE HCL 50 MG TABS 2-3 at bedtime nightly to help sleep   Last Refilled: 05/19/2008 [facility]scribe [facility] [phone]   Method Requested: Telephone to Pharmacy Initial call taken by: Mervin Hack CMA,  November 06, 2008 8:16 AM  Follow-up for Phone Call        okay to refill for 1 year can do electronically Follow-up by: Cindee Salt MD,  November 06, 2008 9:27 AM  Additional Follow-up for Phone Call Additional follow up Details #1::        rx sent to pharmacy by e-scribe. Additional Follow-up by: Mervin Hack CMA,  November 06, 2008 9:50 AM

## 2010-12-22 NOTE — Progress Notes (Signed)
Summary: TRAZODONE/ VICODIN  Phone Note Refill Request Message from:  [facility] [phone] on November 26, 2009 12:12 PM  Refills Requested: Medication #1:  VICODIN 5-500 MG TABS 1 four times daily as needed severe pain   Last Refilled: 10/23/2009  Medication #2:  TRAZODONE HCL 50 MG TABS 2-3 at bedtime nightly to help sleep   Last Refilled: 10/09/2009 letvak patient   Method Requested: Telephone to Pharmacy Initial call taken by: Mervin Hack CMA Duncan Dull),  November 26, 2009 12:12 PM  Follow-up for Phone Call        Trazadone sent electronically. Please call vicodin prescription to pharmacy as written below. Follow-up by: Ruthe Mannan MD,  November 26, 2009 12:14 PM  Additional Follow-up for Phone Call Additional follow up Details #1::        Rx called to pharmacy Additional Follow-up by: Linde Gillis CMA Duncan Dull),  November 26, 2009 3:42 PM    Prescriptions: VICODIN 5-500 MG TABS (HYDROCODONE-ACETAMINOPHEN) 1 four times daily as needed severe pain  #60 x 1   Entered and Authorized by:   Ruthe Mannan MD   Signed by:   Ruthe Mannan MD on 11/26/2009   Method used:   Telephoned to ...       [facility] [address] [phone]* (retail)       [address]       [place], [place]  [address]       Ph: [phone]       Fax: [phone]   RxID:   [id number] TRAZODONE HCL 50 MG TABS (TRAZODONE HCL) 2-3 at bedtime nightly to help sleep  #90 Each x 11   Entered and Authorized by:   Ruthe Mannan MD   Signed by:   Ruthe Mannan MD on 11/26/2009   Method used:   Electronically to        [facility] [phone]* (retail)       [address]       [place], [place]  [address]       Ph: [phone]       Fax: [phone]   RxID:   [id number]

## 2010-12-22 NOTE — Progress Notes (Signed)
  Phone Note Refill Request Message from:  [id number]  Refills Requested: Medication #1:  LORAZEPAM 0.5 MG TABS Take 1/2-1 by mouth bid Initial call taken by: Wandra Mannan,  December 01, 2007 3:46 PM  Follow-up for Phone Call        okay #60 x 0 Due for appt soon --hasn't been in in a while Probably due for Physical--please check Follow-up by: Cindee Salt MD,  December 01, 2007 5:09 PM  Additional Follow-up for Phone Call Additional follow up Details #1::        rx called Additional Follow-up by: Wandra Mannan,  December 01, 2007 5:11 PM

## 2010-12-22 NOTE — Progress Notes (Signed)
Summary: LORAZEPAM   Phone Note Refill Request Message from:  [facility] [phone] on September 22, 2010 9:09 AM  Refills Requested: Medication #1:  LORAZEPAM 1 MG TABS take 1 by mouth two times a day as needed nerves.   Last Refilled: 07/10/2010 E-Scribe Request    Method Requested: Telephone to Pharmacy Initial call taken by: Mervin Hack CMA Duncan Dull),  September 22, 2010 9:09 AM  Follow-up for Phone Call        okay #60 x 1 Follow-up by: Cindee Salt MD,  September 22, 2010 1:21 PM  Additional Follow-up for Phone Call Additional follow up Details #1::        Rx called to pharmacy Additional Follow-up by: DeShannon Smith CMA Duncan Dull),  September 22, 2010 2:38 PM    Prescriptions: LORAZEPAM 1 MG TABS (LORAZEPAM) take 1 by mouth two times a day as needed nerves  #60 x 1   Entered by:   Mervin Hack CMA (AAMA)   Authorized by:   Cindee Salt MD   Signed by:   Mervin Hack CMA (AAMA) on 09/22/2010   Method used:   Telephoned to ...       [facility] [address] [phone]* (retail)       [address]       [place], [place]  [address]       Ph: [phone]       Fax: [phone]   RxID:   [id number]

## 2010-12-22 NOTE — Progress Notes (Signed)
Summary: Rx Lorazepam & Vicodin  Phone Note Refill Request Call back at [phone] Message from:  [facility] #[id number] on July 25, 2008 9:21 AM  Refills Requested: Medication #1:  VICODIN 5-500 MG TABS 1 four times daily as needed severe pain   Last Refilled: 0720/09  Medication #2:  LORAZEPAM 0.5 MG TABS Take 1/2-1 by mouth bid   Last Refilled: 05/04/2008 E-scribe refill request.   Method Requested: Telephone to Pharmacy Initial call taken by: Sydell Axon,  July 25, 2008 9:23 AM  Follow-up for Phone Call        okay #60 x 0 for each Follow-up by: Cindee Salt MD,  July 25, 2008 1:32 PM      Prescriptions: VICODIN 5-500 MG TABS (HYDROCODONE-ACETAMINOPHEN) 1 four times daily as needed severe pain  #60 x 0   Entered by:   Sydell Axon   Authorized by:   Cindee Salt MD   Signed by:   Sydell Axon on 07/25/2008   Method used:   Telephoned to ...       [facility] [address] [phone]* (retail)       [address]       [place], [place]  [address]       Ph: [phone]       Fax: [phone]   RxID:   [id number] LORAZEPAM 0.5 MG TABS (LORAZEPAM) Take 1/2-1 by mouth bid  #60 x 0   Entered by:   Sydell Axon   Authorized by:   Cindee Salt MD   Signed by:   Sydell Axon on 07/25/2008   Method used:   Telephoned to ...       [facility] [address] [phone]* (retail)       [address]       [place], [place]  [address]       Ph: [phone]       Fax: [phone]   RxID:   [id number]

## 2010-12-22 NOTE — Progress Notes (Signed)
Summary: Rx-Vicodin  Phone Note Refill Request Message from:  [id number] on May 20, 2008 8:14 AM  Refills Requested: Medication #1:  VICODIN 5-500 MG TABS 1 four times daily as needed severe pain   Last Refilled: 11/28/2007 #60  Initial call taken by: Silas Sacramento,  May 20, 2008 8:14 AM      Prescriptions: VICODIN 5-500 MG TABS (HYDROCODONE-ACETAMINOPHEN) 1 four times daily as needed severe pain  #30 x 0   Entered and Authorized by:   Shaune Leeks MD   Signed by:   Shaune Leeks MD on 05/20/2008   Method used:   Printed then faxed to ...       [id number]       [address]       [place], [place]  [address]       Ph: [phone]       Fax: [phone]   RxID:   [id number] VICODIN 5-500 MG TABS (HYDROCODONE-ACETAMINOPHEN) 1 four times daily as needed severe pain  #30 x 0   Entered and Authorized by:   Shaune Leeks MD   Signed by:   Shaune Leeks MD on 05/20/2008   Method used:   Electronically sent to ...       [id number]       [address]       [place], [place]  [address]       Ph: [phone]       Fax: [phone]   RxID:   [id number]  Shaune Leeks MD  May 20, 2008 9:02 AM

## 2010-12-22 NOTE — Assessment & Plan Note (Signed)
Summary: 6 MONTH FOLLOWUP/RBH   Vital Signs:  Patient profile:   45 year old female Height:      65 inches Weight:      150 pounds BMI:     25.05 Temp:     98.3 degrees F oral Pulse rate:   76 / minute Pulse rhythm:   regular BP sitting:   128 / 90  (left arm) Cuff size:   regular  Vitals Entered By: Mervin Hack CMA (July 01, 2009 8:57 AM)  History of Present Illness: Chief Complaint: 6 month follow-up  Having increased joint pain Back and into hips Hands, elbows, shoulders tight in the AM----pain progresses as the day goes on Uses vicodin as needed --1/2 if at work, 1 at home generally uses at least 1 per day Plain tylenol and OTC NSAIDs don't give relief  Sleeps okay with the 3 trazodone Still awakens but gets back to sleep Not really rested in the AM  Mood has not been good over the past few months May be fine at one minute and then "fires up" some hot flashes Still depressed--most days Prefers to be by herself--this has worsened Prefers not to see psychiatrist has thought about suicide but not considered it "because of my kid"  Occ chest pain--she considers  it heartburn Eases up on its own No sig SOB Not exertional   Allergies: 1)  * Effexor 2)  Remeron (Mirtazapine) 3)  Lisinopril (Lisinopril)  Past History:  Past medical, surgical, family and social histories (including risk factors) reviewed for relevance to current acute and chronic problems.  Past Medical History: Reviewed history from 12/20/2007 and no changes required. Allergic rhinitis Depression GERD Hypertension Migraines Hyperlipidemia-mild  Past Surgical History: Reviewed history from 06/19/2008 and no changes required. Stress echo, sl. MVP 06/04 EGD 05/05/2006 5/09 Lap chole  Family History: Reviewed history from 12/20/2007 and no changes required. Mom and Dad with HTN Dad had MI in his 21's Dad has DM 2 brothers--1 with HTN No breast or colon cancer Lung cancer in  pat [facility]  Social History: Reviewed history from 04/03/2007 and no changes required. 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         Appetite is fair Eats 2 meals per day--hard to eat lunch--so hot at work weight is stable  Physical Exam  General:  alert.  NAD Neck:  supple, no masses, no thyromegaly, no carotid bruits, and no cervical lymphadenopathy.   Lungs:  normal respiratory effort and normal breath sounds.   Heart:  normal rate, regular rhythm, no murmur, and no gallop.   Abdomen:  soft, non-tender, and no masses.   Msk:  no joint tenderness, no joint swelling, no redness over joints, and no joint deformities.   Extremities:  no edema Neurologic:  alert & oriented X3 and strength normal in all extremities.   Skin:  no rashes and no suspicious lesions.   Psych:  normally interactive, good eye contact, and depressed affect.     Impression & Recommendations:  Problem # 1:  CHEST PAIN, ACUTE (ICD-786.50) Assessment Comment Only  very atypical doesn't sound cardiac not clearly GI no Rx for now  Orders: Venipuncture ([address]) TLB-Renal Function Panel (80069-RENAL) TLB-CBC Platelet - w/Differential (85025-CBCD) TLB-Hepatic/Liver Function Pnl (80076-HEPATIC) TLB-TSH (Thyroid Stimulating Hormone) (84443-TSH) EKG w/ Interpretation (93000)  Problem # 2:  DEPRESSION (ICD-311) Assessment: Deteriorated now with major depression probably the  cause of increased general pain will add bupropion consider psychiatrist  Her updated medication list for this problem includes:    Fluoxetine Hcl 40 Mg Caps (Fluoxetine hcl) .Marland Kitchen... Take one by mouth daily    Trazodone Hcl 50 Mg Tabs (Trazodone hcl) .Marland Kitchen... 2-3 at bedtime nightly to help sleep    Lorazepam 0.5 Mg Tabs (Lorazepam) .Marland Kitchen... Take 1/2-1 by mouth two times a day as needed for nerves    Bupropion Hcl  150 Mg Xr24h-tab (Bupropion hcl) .Marland Kitchen... 1 tab daily for depression  Problem # 3:  HYPERTENSION (ICD-401.9) Assessment: Unchanged fair control no changes  Her updated medication list for this problem includes:    Amlodipine Besylate 5 Mg Tabs (Amlodipine besylate) .Marland Kitchen... Take 1 tablet by mouth once a day  BP today: 128/90 Prior BP: 128/80 (12/24/2008)  Labs Reviewed: K+: 3.8 (12/24/2008) Creat: : 0.8 (12/24/2008)   Chol: 213 (12/20/2007)   HDL: 41.9 (12/20/2007)   LDL: DEL (12/20/2007)   TG: 47 (12/20/2007)  Problem # 4:  GERD (ICD-530.81) Assessment: Unchanged will continue Rx Her updated medication list for this problem includes:    Prilosec 20 Mg Cpdr (Omeprazole) .Marland Kitchen... Take 1 tablet by mouth two times a day  Complete Medication List: 1)  Lo/ovral (28) 0.3-30 Mg-mcg Tabs (Norgestrel-ethinyl estradiol) .... Take one by mouth daily 2)  Fluoxetine Hcl 40 Mg Caps (Fluoxetine hcl) .... Take one by mouth daily 3)  Trazodone Hcl 50 Mg Tabs (Trazodone hcl) .... 2-3 at bedtime nightly to help sleep 4)  Amlodipine Besylate 5 Mg Tabs (Amlodipine besylate) .... Take 1 tablet by mouth once a day 5)  Vicodin 5-500 Mg Tabs (Hydrocodone-acetaminophen) .Marland Kitchen.. 1 four times daily as needed severe pain 6)  Lorazepam 0.5 Mg Tabs (Lorazepam) .... Take 1/2-1 by mouth two times a day as needed for nerves 7)  Prilosec 20 Mg Cpdr (Omeprazole) .... Take 1 tablet by mouth two times a day 8)  Fexofenadine Hcl 180 Mg Tabs (Fexofenadine hcl) .... Take 1 tablet by mouth once daily as needed 9)  Bupropion Hcl 150 Mg Xr24h-tab (Bupropion hcl) .Marland Kitchen.. 1 tab daily for depression  Patient Instructions: 1)  Please schedule a follow-up appointment in 3 weeks.  Prescriptions: BUPROPION HCL 150 MG XR24H-TAB (BUPROPION HCL) 1 tab daily for depression  #30 x 5   Entered and Authorized by:   Cindee Salt MD   Signed by:   Cindee Salt MD on 07/01/2009   Method used:   Electronically to        [facility]  [phone]* (retail)       [address]       [place], [place]  [address]       Ph: [phone]       Fax: [phone]   RxID:   [id number]   Current Allergies (reviewed today): * EFFEXOR REMERON (MIRTAZAPINE) LISINOPRIL (LISINOPRIL)   EKG  Procedure date:  07/01/2009  Findings:      sinus @ 68 Normal

## 2010-12-22 NOTE — Progress Notes (Signed)
Summary: VICODIN  Phone Note Refill Request Message from:  [phone] on September 24, 2009 2:20 PM  Refills Requested: Medication #1:  VICODIN 5-500 MG TABS 1 four times daily as needed severe pain   Last Refilled: 08/03/2009 E-Scribe Request   Method Requested: Telephone to Pharmacy Initial call taken by: Mervin Hack CMA Duncan Dull),  September 24, 2009 2:20 PM  Follow-up for Phone Call        okay #60 x 1 Follow-up by: Cindee Salt MD,  September 25, 2009 1:11 PM  Additional Follow-up for Phone Call Additional follow up Details #1::        Rx called to pharmacy Additional Follow-up by: Mervin Hack CMA Duncan Dull),  September 25, 2009 4:16 PM    Prescriptions: VICODIN 5-500 MG TABS (HYDROCODONE-ACETAMINOPHEN) 1 four times daily as needed severe pain  #60 x 1   Entered by:   Mervin Hack CMA (AAMA)   Authorized by:   Cindee Salt MD   Signed by:   Mervin Hack CMA (AAMA) on 09/25/2009   Method used:   Telephoned to ...       [facility] [address] [phone] (retail)       [address]       [place], [place]  [address]       Ph: [phone]       Fax: [phone]   RxID:   [id number]

## 2010-12-22 NOTE — Consult Note (Signed)
Summary: Heather Russo,Cardiology,Phone Note    Heather Russo,Cardiology,Phone Note    Imported By: Beau Fanny 12/20/2007 11:20:14  _____________________________________________________________________  External Attachment:    Type:   Image     Comment:   External Document

## 2010-12-22 NOTE — Progress Notes (Signed)
Summary: wants to stop welbutrin  Phone Note Call from Patient Call back at Home Phone [phone]   [name]: Patient Call For: Cindee Salt MD Summary of Call: Pt is having nausea, rash, bad taste in her mouth and blurred vision x 2 days.  She started taking welbutrin one week ago today. She wants to stop this.  Is it ok if she just stops. Initial call taken by: Lowella Petties CMA,  July 11, 2009 9:40 AM  Follow-up for Phone Call        yes stop it -- and update next week if symptoms do not improve  if any mouth /tougue swelling or sob/wheeze - update me /seek care (those are signs of all rxn) I will note rxn on emr  Follow-up by: Judith Part MD,  July 11, 2009 12:45 PM  Additional Follow-up for Phone Call Additional follow up Details #1::        Advised pt. Additional Follow-up by: Lowella Petties CMA,  July 11, 2009 2:33 PM   New Allergies: ! * WELLBUTRIN XL New Allergies: ! * WELLBUTRIN XL

## 2010-12-22 NOTE — Progress Notes (Signed)
Summary: Lorazapam refill  Phone Note Call from Patient Call back at [phone]   [name]: Patient Call For: Heather Salt MD Summary of Call: [facility] said [facility] for [facility]ill of Lorazepam with no response. I explained we have not received a request. Pt said she needed to pick up at lunch time today. Please call pt when ready [phone]. [facility] [address] [place] [phone]. Please advise.  Initial call taken by: Lewanda Rife LPN,  August 15, 2009 8:36 AM  Follow-up for Phone Call        okay #60 x 1 Follow-up by: Heather Salt MD,  August 15, 2009 9:56 AM  Additional Follow-up for Phone Call Additional follow up Details #1::        left message on cell phone that rx was called in to pharmacy. Additional Follow-up by: Mervin Hack CMA (AAMA),  August 15, 2009 10:10 AM    Prescriptions: LORAZEPAM 0.5 MG TABS (LORAZEPAM) Take 1/2-1 by mouth two times a day as needed for nerves  #60 x 1   Entered by:   Mervin Hack CMA (AAMA)   Authorized by:   Heather Salt MD   Signed by:   Mervin Hack CMA (AAMA) on 08/15/2009   Method used:   Telephoned to ...       [facility] [address] [phone]* (retail)       [address]       [place], [place]  [address]       Ph: [phone]       Fax: [phone]   RxID:   [id number]

## 2010-12-22 NOTE — Progress Notes (Signed)
Summary: Request U/S results  Phone Note Call from Patient Call back at [phone]   [name]: Patient Call For: Dr. Alphonsus Sias Summary of Call: Pt wants the results of her U/S. Initial call taken by: Sydell Axon,  Apr 04, 2008 9:41 AM  Follow-up for Phone Call        see ultrasound report and call patient please Follow-up by: Cindee Salt MD,  Apr 04, 2008 10:08 AM  Additional Follow-up for Phone Call Additional follow up Details #1::        called pt Additional Follow-up by: Wandra Mannan,  Apr 04, 2008 10:37 AM

## 2010-12-22 NOTE — Progress Notes (Signed)
Summary:  LORAZEPAM  Phone Note Refill Request Message from:  [phone] on December 03, 2009 9:50 AM  Refills Requested: Medication #1:  LORAZEPAM 1 MG TABS take 1 by mouth two times a day as needed nerves.   Last Refilled: 10/09/2009 E-Scribe Request    Method Requested: Telephone to Pharmacy Initial call taken by: Mervin Hack CMA Duncan Dull),  December 03, 2009 9:50 AM  Follow-up for Phone Call        called pt to advise that from her 06/2009 visit she was suppose to follow-up in 3 weeks, pt made appt for 12/18/2009 @ 2:45. DeShannon Katrinka Blazing CMA Duncan Dull)  December 03, 2009 9:51 AM   Additional Follow-up for Phone Call Additional follow up Details #1::        okay to fill #60 x 0 Additional Follow-up by: Cindee Salt MD,  December 03, 2009 1:58 PM    Additional Follow-up for Phone Call Additional follow up Details #2::    rx called in to pharmacy. DeShannon Smith CMA Duncan Dull)  December 03, 2009 3:17 PM   Prescriptions: LORAZEPAM 1 MG TABS (LORAZEPAM) take 1 by mouth two times a day as needed nerves  #60 x 0   Entered by:   Mervin Hack CMA (AAMA)   Authorized by:   Cindee Salt MD   Signed by:   Mervin Hack CMA (AAMA) on 12/03/2009   Method used:   Telephoned to ...       [facility] [address] [phone]* (retail)       [address]       [place], [place]  [address]       Ph: [phone]       Fax: [phone]   RxID:   [id number]

## 2010-12-22 NOTE — Progress Notes (Signed)
Summary: LORAZEPAM  Phone Note Refill Request [facility] from:  [facility] on Apr 07, 2009 10:15 AM  Refills Requested: Medication #1:  LORAZEPAM 0.5 MG TABS Take 1/2-1 by mouth bid   Last Refilled: 02/03/2009 form on your desk   Method Requested: Fax to Local Pharmacy Initial call taken by: Mervin Hack CMA,  Apr 07, 2009 10:15 AM  Follow-up for Phone Call        okay #60 x 1 No form on my desk Follow-up by: Cindee Salt MD,  Apr 07, 2009 1:36 PM  Additional Follow-up for Phone Call Additional follow up Details #1::        Rx faxed to pharmacy Additional Follow-up by: Mervin Hack CMA,  Apr 07, 2009 3:12 PM    New/Updated Medications: LORAZEPAM 0.5 MG TABS (LORAZEPAM) Take 1/2-1 by mouth two times a day as needed for nerves   Prescriptions: LORAZEPAM 0.5 MG TABS (LORAZEPAM) Take 1/2-1 by mouth two times a day as needed for nerves  #60 x 1   Entered by:   Mervin Hack CMA   Authorized by:   Cindee Salt MD   Signed by:   Mervin Hack CMA on 04/07/2009   Method used:   Handwritten   RxID:   [id number]

## 2010-12-22 NOTE — Assessment & Plan Note (Signed)
Summary: PAP SMEAR AND CPX/CLE   Vital Signs:  Patient Profile:   45 Years Old Female Weight:      150.38 pounds Temp:     97 degrees F oral Pulse rate:   64 / minute BP sitting:   124 / 76  (left arm)  Vitals Entered By: Wandra Mannan (December 20, 2007 8:21 AM)                 Chief Complaint:  cpx and pap.  History of Present Illness: Still gets moods at times--"ill and cranky" May be premenstrual Uses fluoxetine daily Generally takes the lorazepam each night--rarely during the day  Current Allergies (reviewed today): * EFFEXOR REMERON (MIRTAZAPINE) LISINOPRIL (LISINOPRIL)  Past Medical History:    Reviewed history from 04/03/2007 and no changes required:       Allergic rhinitis       Depression       GERD       Hypertension       Migraines       Hyperlipidemia-mild  Past Surgical History:    Reviewed history from 04/03/2007 and no changes required:       Stress echo, sl. 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  Often when doing something. No dyspnea. No limitations. Passes without her changing her activity Occ orthostatic dizziness heartburn okay unless she misses meds No bowel or urinary problems Occ stress incontnience Periods normal on OCP No sexual problems Had suspicious red, flaky spot on chest--keeps coming  back Mild pain in hand knuckles No exercise but weight is stable   Physical Exam  General:     alert and normal appearance.   Eyes:     pupils equal, pupils round, pupils reactive to light, and no optic disk abnormalities.   Ears:     R ear normal and L ear normal.   Mouth:     no erythema and no exudates.   Neck:     supple, no masses, no thyromegaly, no carotid bruits, and no cervical lymphadenopathy.   Breasts:     no masses and no adenopathy.   Lungs:     normal respiratory effort and normal breath sounds.   Heart:     normal rate, regular rhythm, no murmur, and no gallop.   Abdomen:     soft, non-tender, and no masses.   Genitalia:     no vaginal or cervical lesions, no friaility or hemorrhage, and normal uterus size and position.   Pap done Msk:     no joint tenderness and no joint swelling.   Pulses:  normal in feet Extremities:     no edema Skin:     actinic on upper chest Psych:     normally interactive, good eye contact, not anxious appearing, and not depressed appearing.      Impression & Recommendations:  Problem # 1:  PREVENTIVE HEALTH CARE (ICD-V70.0) Assessment: Comment Only needs mammo will check sugar and lipid (last LDL 143)  Problem # 2:  ACTINIC KERATOSIS (ICD-702.0) Assessment: New liquid nitrogen 30 secs x 2 to lesion Discussed care at home Orders: Cryotherapy/Destruction benign or premalignant lesion (1st lesion)  (17000)   Problem # 3:  HYPERTENSION (ICD-401.9) Assessment: Unchanged good control No changes needed  Her updated medication list for this problem includes:    Amlodipine Besylate 5 Mg Tabs (Amlodipine besylate) .Marland Kitchen... Take 1 tablet by mouth once a day  Orders: Venipuncture (34742) TLB-Renal Function Panel (80069-RENAL)   Problem # 4:  DEPRESSION (ICD-311) Assessment: Comment Only Still with moodiness but generally stable no changes needed  Her updated medication list for this problem includes:     Fluoxetine Hcl 40 Mg Caps (Fluoxetine hcl) .Marland Kitchen... Take one by mouth daily    Trazodone Hcl 50 Mg Tabs (Trazodone hcl)    Lorazepam 0.5 Mg Tabs (Lorazepam) .Marland Kitchen... Take 1/2-1 by mouth bid   Problem # 5:  CHEST PAIN, ACUTE (ICD-786.50) atypical doubt ischemia discussed signs of worrisome chest pain Orders: TLB-TSH (Thyroid Stimulating Hormone) (84443-TSH) TLB-CBC Platelet - w/Differential (85025-CBCD)   Complete Medication List: 1)  Lo/ovral (28) 0.3-30 Mg-mcg Tabs (Norgestrel-ethinyl estradiol) .... Take one by mouth daily 2)  Fluoxetine Hcl 40 Mg Caps (Fluoxetine hcl) .... Take one by mouth daily 3)  Trazodone Hcl 50 Mg Tabs (Trazodone hcl) 4)  Amlodipine Besylate 5 Mg Tabs (Amlodipine besylate) .... Take 1 tablet by mouth once a day 5)  Vicodin 5-500 Mg Tabs (Hydrocodone-acetaminophen) .Marland Kitchen.. 1 four times daily as needed severe pain 6)  Lorazepam 0.5 Mg Tabs (Lorazepam) .... Take 1/2-1 by mouth bid 7)  Prilosec Otc 20 Mg Tbec (Omeprazole magnesium) .... Take one by mouth daily  Other Orders: TLB-Lipid Panel (80061-LIPID) Tdap => 40yrs IM (59563) Admin 1st Vaccine (87564) Radiology Referral (Radiology)   Patient Instructions: 1)  Please schedule a follow-up appointment in 6 months. 2)  Schedule your mammogram.    ] Current Allergies (reviewed today): * EFFEXOR REMERON (MIRTAZAPINE) LISINOPRIL (LISINOPRIL) Current Medications (including changes made in today's visit):  LO/OVRAL (28) 0.3-30 MG-MCG TABS (NORGESTREL-ETHINYL ESTRADIOL) Take one by mouth daily FLUOXETINE HCL 40 MG CAPS (FLUOXETINE HCL) Take one by mouth daily TRAZODONE HCL 50 MG TABS (TRAZODONE HCL)  AMLODIPINE BESYLATE 5 MG TABS (AMLODIPINE BESYLATE) Take 1 tablet by mouth once a day VICODIN 5-500 MG TABS (HYDROCODONE-ACETAMINOPHEN) 1 four times daily as needed severe pain LORAZEPAM 0.5 MG TABS (LORAZEPAM) Take 1/2-1 by mouth bid PRILOSEC OTC 20 MG TBEC (OMEPRAZOLE MAGNESIUM) Take one by mouth  daily    Tetanus/Td Vaccine    Vaccine Type: Tdap    Site: left deltoid    Mfr: Sanofi Pasteur    Dose: 0.5 ml    Route: IM    Given by: Wandra Mannan    Exp. Date: 10/18/2009    Lot #: [id number]    VIS given: 06/02/05 version given December 20, 2007.    EKG  Procedure date:  12/20/2007  Findings:      normal:     EKG  Procedure date:  12/20/2007  Findings:      normal:

## 2010-12-22 NOTE — Progress Notes (Signed)
  Phone Note Refill Request Message from:  [phone]  Refills Requested: Medication #1:  LORAZEPAM 0.5 MG TABS Take 1/2-1 by mouth bid Initial call taken by: Wandra Mannan,  Mar 22, 2008 7:48 AM  Follow-up for Phone Call        okay #60 x 1 Follow-up by: Cindee Salt MD,  Mar 22, 2008 8:56 AM  Additional Follow-up for Phone Call Additional follow up Details #1::        rx called in Additional Follow-up by: Wandra Mannan,  Mar 22, 2008 9:06 AM      Prescriptions: LORAZEPAM 0.5 MG TABS (LORAZEPAM) Take 1/2-1 by mouth bid  #60 x 1   Entered by:   Wandra Mannan   Authorized by:   Cindee Salt MD   Signed by:   Wandra Mannan on 03/22/2008   Method used:   Handwritten   RxID:   [id number]

## 2010-12-22 NOTE — Progress Notes (Signed)
Summary: VICODIN  Phone Note Refill Request Message from:  [phone] on February 10, 2009 2:59 PM  Refills Requested: Medication #1:  VICODIN 5-500 MG TABS 1 four times daily as needed severe pain   Last Refilled: 01/13/2009 e-scribe request   Method Requested: Telephone to Pharmacy Initial call taken by: Mervin Hack CMA,  February 10, 2009 2:59 PM  Follow-up for Phone Call        okay #60 x 1 Follow-up by: Cindee Salt MD,  February 10, 2009 5:33 PM  Additional Follow-up for Phone Call Additional follow up Details #1::        prescribtion called to pharmacst [facility]  Additional Follow-up by: Providence Crosby,  February 11, 2009 9:09 AM

## 2010-12-22 NOTE — Progress Notes (Signed)
Summary: Refill Lorazepam  Phone Note From Pharmacy Call back at [phone]   [name]: [facility] #4540* Call For: Dr. Alphonsus Sias  Summary of Call: [facility] sent request for refill on pt's Lorazepam 0.5 mg, she takes 1/2 to 1 tablet twice daily  as needed for nerves. Initial call taken by: Sydell Axon,  January 29, 2008 2:43 PM  Follow-up for Phone Call        Refill this Lorazepam 0.5 mg , # 60, no refills. Refill Lo Ovral- x 6. Follow-up by: Kathie Rhodes NP,  January 29, 2008 3:52 PM  Additional Follow-up for Phone Call Additional follow up Details #1::        Rx's called to pharmacy as instructed. Additional Follow-up by: Sydell Axon,  January 29, 2008 3:57 PM      Prescriptions: LORAZEPAM 0.5 MG TABS (LORAZEPAM) Take 1/2-1 by mouth bid  #60 x 1   Entered and Authorized by:   Kathie Rhodes NP   Signed by:   Sydell Axon on 01/29/2008   Method used:   Electronically sent to ...       [facility] [id number]       [address]       [place], [place]  [address]       Ph: [phone]       Fax: [phone]   RxID:   [id number] LO/OVRAL (28) 0.3-30 MG-MCG TABS (NORGESTREL-ETHINYL ESTRADIOL) Take one by mouth daily  #6 x 0   Entered and Authorized by:   Kathie Rhodes NP   Signed by:   Sydell Axon on 01/29/2008   Method used:   Electronically sent to ...       [facility] [id number]       [address]       [place], [place]  [address]       Ph: [phone]       Fax: [phone]   RxID:   [id number]

## 2010-12-22 NOTE — Assessment & Plan Note (Signed)
Summary: 6 month follow up/rbh   Vital Signs:  Patient Profile:   45 Years Old Female Weight:      147 pounds (66.82 kg) Temp:     98.6 degrees F (37.00 degrees C) oral Pulse rate:   68 / minute Pulse rhythm:   regular BP sitting:   120 / 80  (left arm) Cuff size:   regular  Vitals Entered By: Silas Sacramento (June 19, 2008 9:13 AM)                 Chief Complaint:  66mo f/u.  History of Present Illness: Had continued abd pain went to ER in [place] and had a big gallstone Had lap chole and symptoms have now resolved  Has had more problems with her neck lately--has needed more vicodin Both day and night symptoms  Mood has been about the same Gets ill at times and also depressed may last for several days but generally comes and goes No problems maintaining her responsibilites at home and work Not anhedonic    Current Allergies: * EFFEXOR REMERON (MIRTAZAPINE) LISINOPRIL (LISINOPRIL)  Past Medical History:    Reviewed history from 12/20/2007 and no changes required:       Allergic rhinitis       Depression       GERD       Hypertension       Migraines       Hyperlipidemia-mild  Past Surgical History:    Stress echo, sl. 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         happy with allegra for her allergy symptoms Sleep is sporadic--doesn't take the trazodone regularly Notes some numbness in left 5th finger and toe--sporadic and not together   Physical Exam  General:     alert and normal appearance.   Neck:     supple, no masses, no thyromegaly, no carotid bruits, and no cervical lymphadenopathy.   Lungs:     normal respiratory effort and normal breath sounds.    Heart:     normal rate, regular rhythm, no murmur, and no gallop.   Abdomen:     soft and non-tender.   Msk:     no joint tenderness and no joint swelling.   Extremities:     no edema or calf tenderness Skin:     no rashes and no suspicious lesions.   Psych:     normally interactive, good eye contact, not anxious appearing, and subdued.      Impression & Recommendations:  Problem # 1:  DEPRESSION (ICD-311) Assessment: Unchanged please use the trazodone every night no other changes  Her updated medication list for this problem includes:    Fluoxetine Hcl 40 Mg Caps (Fluoxetine hcl) .Marland Kitchen... Take one by mouth daily    Trazodone Hcl 50 Mg Tabs (Trazodone hcl) .Marland Kitchen... 2-3 at bedtime nightly to help sleep    Lorazepam 0.5 Mg Tabs (Lorazepam) .Marland Kitchen... Take 1/2-1 by mouth bid   Problem # 2:  ABDOMINAL PAIN, ACUTE (ICD-789.00) Assessment: Improved resolved since chole  Problem #  3:  HYPERTENSION (ICD-401.9) Assessment: Unchanged good control no changes needed  Her updated medication list for this problem includes:    Amlodipine Besylate 5 Mg Tabs (Amlodipine besylate) .Marland Kitchen... Take 1 tablet by mouth once a day  BP today: 120/80 Prior BP: 114/86 (04/02/2008)  Labs Reviewed: Creat: 0.9 (04/02/2008) Chol: 213 (12/20/2007)   HDL: 41.9 (12/20/2007)   LDL: DEL (12/20/2007)   TG: 47 (12/20/2007)   Problem # 4:  ALLERGIC RHINITIS (ICD-477.9) Assessment: Unchanged happy with fexofenadine  Her updated medication list for this problem includes:    Fexofenadine Hcl 180 Mg Tabs (Fexofenadine hcl) .Marland Kitchen... Take 1 tablet by mouth once daily as needed   Complete Medication List: 1)  Lo/ovral (28) 0.3-30 Mg-mcg Tabs (Norgestrel-ethinyl estradiol) .... Take one by mouth daily 2)  Fluoxetine Hcl 40 Mg Caps (Fluoxetine hcl) .... Take one by mouth daily 3)  Trazodone Hcl 50 Mg Tabs (Trazodone hcl) .... 2-3 at bedtime nightly to help sleep 4)  Amlodipine Besylate 5 Mg Tabs (Amlodipine besylate)  .... Take 1 tablet by mouth once a day 5)  Vicodin 5-500 Mg Tabs (Hydrocodone-acetaminophen) .Marland Kitchen.. 1 four times daily as needed severe pain 6)  Lorazepam 0.5 Mg Tabs (Lorazepam) .... Take 1/2-1 by mouth bid 7)  Prilosec 20 Mg Cpdr (Omeprazole) .... Take 1 tablet by mouth two times a day 8)  Fexofenadine Hcl 180 Mg Tabs (Fexofenadine hcl) .... Take 1 tablet by mouth once daily as needed   Patient Instructions: 1)  Please schedule a follow-up appointment in 6 months for physical   Prescriptions: FEXOFENADINE HCL 180 MG  TABS (FEXOFENADINE HCL) Take 1 tablet by mouth once daily as needed  #30 x 12   Entered and Authorized by:   Cindee Salt MD   Signed by:   Cindee Salt MD on 06/19/2008   Method used:   Electronically sent to ...       [id number]       [address]       [place], [place]  [address]       Ph: [phone]       Fax: [phone]   RxID:   [id number]  ]

## 2010-12-22 NOTE — Progress Notes (Signed)
Summary: Rx Vicodin  Phone Note Refill Request Call back at [phone] Message from:  [facility] [address] on January 26, 2010 8:25 AM  Refills Requested: Medication #1:  VICODIN 5-500 MG TABS 1 four times daily as needed severe pain   Last Refilled: 12/24/2009 Received faxed refill request, please advise   Method Requested: Telephone to Pharmacy Initial call taken by: Linde Gillis CMA Duncan Dull),  January 26, 2010 8:25 AM  Follow-up for Phone Call        Rx called to pharmacy Follow-up by: Linde Gillis CMA Duncan Dull),  January 26, 2010 10:47 AM    Prescriptions: VICODIN 5-500 MG TABS (HYDROCODONE-ACETAMINOPHEN) 1 four times daily as needed severe pain  #60 x 1   Entered and Authorized by:   Hannah Beat MD   Signed by:   Hannah Beat MD on 01/26/2010   Method used:   Telephoned to ...       [facility] [address] [address] [phone]* (retail)       [address]       [place], [place]  [address]       Ph: [phone]       Fax: [phone]   RxID:   [id number]

## 2010-12-22 NOTE — Progress Notes (Signed)
Summary: fluoxetine refill  Phone Note Refill Request Message from:  Fax from Pharmacy on May 07, 2008 8:05 AM  Refills Requested: Medication #1:  FLUOXETINE HCL 40 MG CAPS Take one by mouth daily Initial call taken by: Liane Comber,  May 07, 2008 8:05 AM  Follow-up for Phone Call        okay x 1 year can be done electronically if pharmacy on the list Follow-up by: Cindee Salt MD,  May 07, 2008 8:16 AM  Additional Follow-up for Phone Call Additional follow up Details #1::        Rx done, was sent electronically. ....................................................Marland KitchenLiane Comber May 07, 2008 9:38 AM

## 2010-12-22 NOTE — Progress Notes (Signed)
Summary: Rx Vicodin  Phone Note Refill Request Call back at [phone] Message from:  [id number] on July 03, 2009 9:27 AM  Refills Requested: Medication #1:  VICODIN 5-500 MG TABS 1 four times daily as needed severe pain   Last Refilled: 05/31/2009 Received e-scribe request.   Method Requested: Telephone to Pharmacy Initial call taken by: Sydell Axon LPN,  July 03, 2009 9:28 AM  Follow-up for Phone Call        okay #60 x 1 Follow-up by: Cindee Salt MD,  July 03, 2009 2:03 PM  Additional Follow-up for Phone Call Additional follow up Details #1::        Rx called to pharmacy as instructed. Additional Follow-up by: Sydell Axon LPN,  July 03, 2009 5:19 PM    Prescriptions: VICODIN 5-500 MG TABS (HYDROCODONE-ACETAMINOPHEN) 1 four times daily as needed severe pain  #60 x 1   Entered by:   Sydell Axon LPN   Authorized by:   Cindee Salt MD   Signed by:   Sydell Axon LPN on 84/13/2440   Method used:   Telephoned to ...       [facility] [address] [phone]* (retail)       [address]       [place], [place]  [address]       Ph: [phone]       Fax: [phone]   RxID:   [id number]

## 2010-12-22 NOTE — Letter (Signed)
Summary: Results Follow up Letter  [facility]  [address] [place], [place] [address]   Phone: [phone]  Fax: [phone]    12/22/2007 MRN: [id number]  [facility] [address] [place], [place]  [address]  Dear Ms. Margraf,  The following are the results of your recent test(s):  Test         Result    Pap Smear:        Normal _X____  Not Normal _____ Comments: ______________________________________________________ Cholesterol: LDL(Bad cholesterol):         Your goal is less than:         HDL (Good cholesterol):       Your goal is more than: Comments:  ______________________________________________________ Mammogram:        Normal _____  Not Normal _____ Comments:  ___________________________________________________________________ Hemoccult:        Normal _____  Not normal _______ Comments:    _____________________________________________________________________ Other Tests:    We routinely do not discuss normal results over the telephone.  If you desire a copy of the results, or you have any questions about this information we can discuss them at your next office visit.   Sincerely,  Dr. Alphonsus Sias

## 2010-12-22 NOTE — Assessment & Plan Note (Signed)
Summary: RIGHT CHEST PAIN/RBH   Vital Signs:  Patient Profile:   45 Years Old Female Weight:      146.38 pounds Temp:     98.5 degrees F oral Pulse rate:   88 / minute BP sitting:   132 / 80  (left arm)  Vitals Entered By: Wandra Mannan (Apr 03, 2007 4:16 PM)               Chief Complaint:  R CHEST PAIN X I WEEK.  History of Present Illness: A lot of pain on R side Started on lateral side of right breast--now moving to the center Started 6 days ago but worsening Some cough--dry, but tastes nasty Low grade fever this AM  No known injury, no falls, no new work or tasks  Tried tylenol--no help---and painhas been worsening  Pain is very sharp, and all around. No rash. Feels sore  Current Allergies: * EFFEXOR REMERON (MIRTAZAPINE) LISINOPRIL (LISINOPRIL)  Past Medical History:    Reviewed history from 04/03/2007 and no changes required:       Allergic rhinitis       Depression       GERD       Hypertension       Migraines     Review of Systems      See HPI   Physical Exam  General:     alert.  NAD Neck:     supple and no thyromegaly.   Chest Wall:     Pain with deep breath and even percussion R back and along upper border of R breast Breasts:     Right breast without masses or tenderness Lungs:     normal respiratory effort, normal breath sounds, no dullness, no crackles, and no wheezes.   Heart:     normal rate, regular rhythm, and no murmur.   Cervical Nodes:     No lymphadenopathy noted Axillary Nodes:     No palpable lymphadenopathy    Impression & Recommendations:  Problem # 1:  CHEST PAIN, ACUTE (ICD-786.50)  Orders: CXR- 2view (CXR)  she has had subacute onset of chest pain associated with cough and perhaps some low-grade fever.  This constellation suggests pleurodynia and asked explained that this is an infection with a virus, the coxsackie virus the other diagnostic possibility would be some kind of muscle or ligament strain of  the chest wall.  She reports no injury or any other predisposing factor that might have caused this.  Plan: I asked her to try ibuprofen and doses which may help her pain but not act up for reflux.  I would like her to try to it time to be 400 mg up to 3 times a day.  She continues on her Prilosec.  Also going to give her prescription for some Vicodin to give her some better pain relief when the pain is severe.  I recommend that she concentrate on using this mostly at bedtime.  Medications Added to Medication List This Visit: 1)  Vicodin 5-500 Mg Tabs (Hydrocodone-acetaminophen) .Marland Kitchen.. 1 four times daily as needed severe pain   Patient Instructions: 1)  Please schedule a follow-up appointment as needed.

## 2010-12-22 NOTE — Progress Notes (Signed)
Summary:  FLUOXETINE  Phone Note Refill Request Message from:  [phone] on June 05, 2009 2:10 PM  Refills Requested: Medication #1:  FLUOXETINE HCL 40 MG CAPS Take one by mouth daily   Last Refilled: 05/06/2009 E-Scribe Request    Method Requested: Electronic Initial call taken by: Mervin Hack CMA,  June 05, 2009 2:10 PM  Follow-up for Phone Call        okay to refill for 1 year Follow-up by: Cindee Salt MD,  June 05, 2009 2:40 PM  Additional Follow-up for Phone Call Additional follow up Details #1::        Rx faxed to pharmacy Additional Follow-up by: Mervin Hack CMA,  June 05, 2009 3:03 PM    Prescriptions: FLUOXETINE HCL 40 MG CAPS (FLUOXETINE HCL) Take one by mouth daily  #30 Each x 12   Entered by:   Mervin Hack CMA   Authorized by:   Cindee Salt MD   Signed by:   Mervin Hack CMA on 06/05/2009   Method used:   Electronically to        [facility] [address] [phone]* (retail)       [address]       [place], [place]  [address]       Ph: [phone]       Fax: [phone]   RxID:   [id number]

## 2010-12-22 NOTE — Miscellaneous (Signed)
  Clinical Lists Changes 

## 2010-12-22 NOTE — Progress Notes (Signed)
Summary: Rx-Vicodin  Phone Note Refill Request Message from:  [facility] on June 10, 2008 12:35 PM  Refills Requested: Medication #1:  VICODIN 5-500 MG TABS 1 four times daily as needed severe pain   Last Refilled: 11/28/2007 [facility] [address] [phone]  Initial call taken by: Silas Sacramento,  June 10, 2008 12:35 PM  Follow-up for Phone Call        okay #60 x 0 Follow-up by: Cindee Salt MD,  June 10, 2008 1:30 PM  Additional Follow-up for Phone Call Additional follow up Details #1::        med called into pharmacy Additional Follow-up by: Silas Sacramento,  June 10, 2008 4:00 PM

## 2010-12-22 NOTE — Progress Notes (Signed)
Summary: rx  Phone Note Refill Request Call back at [phone]  [facility] [place] Message from:  Pharmacy on July 14, 2007 5:25 PM  Refills Requested: Medication #1:  VICODIN 5-500 MG TABS 1 four times daily as needed severe pain Initial call taken by: Liane Comber,  July 14, 2007 5:25 PM  Follow-up for Phone Call        okay #60 x 0 Follow-up by: Cindee Salt MD,  July 14, 2007 5:30 PM  Additional Follow-up for Phone Call Additional follow up Details #1::        Rx called to pharmacy ..................................................................Marland KitchenMarcelle Smiling Chavers  July 14, 2007 5:50 PM

## 2010-12-22 NOTE — Progress Notes (Signed)
Summary: VICODIN  Phone Note Refill Request Message from:  [phone] on April 28, 2009 9:08 AM  Refills Requested: Medication #1:  VICODIN 5-500 MG TABS 1 four times daily as needed severe pain   Last Refilled: 03/14/2009 E-Scribe Request    Method Requested: Telephone to Pharmacy Initial call taken by: Mervin Hack CMA,  April 28, 2009 9:08 AM  Follow-up for Phone Call        okay #60 x 1 Follow-up by: Cindee Salt MD,  April 28, 2009 1:46 PM  Additional Follow-up for Phone Call Additional follow up Details #1::        Rx called to pharmacy Additional Follow-up by: Mervin Hack CMA,  April 28, 2009 2:25 PM      Prescriptions: VICODIN 5-500 MG TABS (HYDROCODONE-ACETAMINOPHEN) 1 four times daily as needed severe pain  #60 x 1   Entered by:   Mervin Hack CMA   Authorized by:   Cindee Salt MD   Signed by:   Mervin Hack CMA on 04/28/2009   Method used:   Telephoned to ...       [facility] [address] [phone]* (retail)       [address]       [place], [place]  [address]       Ph: [phone]       Fax: [phone]   RxID:   [id number]

## 2010-12-22 NOTE — Progress Notes (Signed)
Summary: Rx Vicodin  Phone Note Refill Request Call back at [phone] Message from:  [id number] on October 24, 2008 10:40 AM  Refills Requested: Medication #1:  VICODIN 5-500 MG TABS 1 four times daily as needed severe pain   Last Refilled: 09/13/2008 Received e-script.   Method Requested: Telephone to Pharmacy Initial call taken by: Sydell Axon,  October 24, 2008 10:42 AM  Follow-up for Phone Call        okay #60 x 1 Follow-up by: Cindee Salt MD,  October 24, 2008 1:23 PM  Additional Follow-up for Phone Call Additional follow up Details #1::        Rx called to pharmacy. Additional Follow-up by: Sydell Axon,  October 24, 2008 1:43 PM      Prescriptions: VICODIN 5-500 MG TABS (HYDROCODONE-ACETAMINOPHEN) 1 four times daily as needed severe pain  #60 x 1   Entered by:   Sydell Axon   Authorized by:   Cindee Salt MD   Signed by:   Sydell Axon on 10/24/2008   Method used:   Telephoned to ...       [facility] [address] [phone]* (retail)       [address]       [place], [place]  [address]       Ph: [phone]       Fax: [phone]   RxID:   [id number]

## 2010-12-22 NOTE — Assessment & Plan Note (Signed)
Summary: 3 m f/u dlo   Vital Signs:  Patient profile:   45 year old female Weight:      142 pounds BMI:     23.72 Temp:     98.8 degrees F oral Pulse rate:   88 / minute Pulse rhythm:   regular BP sitting:   120 / 80  (left arm) Cuff size:   regular  Vitals Entered By: Mervin Hack CMA Duncan Dull) (May 14, 2010 3:44 PM) CC: 3 month follow-up   History of Present Illness: Still having lots of stress Husband's chemo cut back to once a day still needs morphine Eating some better Good days and bad days  Still feels down most of the time lots of stress doesn't want to be around anyone  grandchild born---living with them does get some enjoyment from him but really dampened by husband's illness  pain comes and goes uses hydrocodone when pain in back of neck---still and pain in AM occ pain just turning head watching TV this causes her headaches  No chest pain No SOB No exercise  May be losing job stressed about having to look for another one  Allergies: 1)  ! * Wellbutrin Xl 2)  * Effexor 3)  Remeron (Mirtazapine) 4)  Lisinopril (Lisinopril)  Past History:  Past medical, surgical, family and social histories (including risk factors) reviewed for relevance to current acute and chronic problems.  Past Medical History: Reviewed history from 12/20/2007 and no changes required. Allergic rhinitis Depression GERD Hypertension Migraines Hyperlipidemia-mild  Past Surgical History: Reviewed history from 06/19/2008 and no changes required. Stress echo, sl. MVP 06/04 EGD 05/05/2006 5/09 Lap chole  Family History: Reviewed history from 12/20/2007 and no changes required. Mom and Dad with HTN Dad had MI in his 80's Dad has DM 2 brothers--1 with HTN No breast or colon cancer Lung cancer in pat [facility]  Social History: Reviewed history from 04/03/2007 and no changes required. 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   Neck:  supple, no masses, no thyromegaly, no carotid bruits, and no cervical lymphadenopathy.   Lungs:  normal respiratory effort and normal breath sounds.   Heart:  normal rate, regular rhythm, no murmur, and no gallop.   Extremities:  no edema Psych:  normally interactive, good eye contact, depressed affect, and tearful.     Impression & Recommendations:  Problem # 1:  ANXIETY, SITUATIONAL (ICD-308.3) Assessment Comment Only ongoing stress with husband's liver cancer dicussed caring for herself--gave "Caregiver's bill of rights" she still doesn't want counsellor will continue lorazaepam as needed   Problem # 2:  DEPRESSION (ICD-311) Assessment: Unchanged ongoing but stable will conitinue current Rx  Her updated medication list for this problem includes:    Fluoxetine Hcl 40 Mg Caps (Fluoxetine hcl) .Marland Kitchen... Take one by mouth daily    Trazodone Hcl 50 Mg Tabs (Trazodone hcl) .Marland Kitchen... 2-3 at bedtime nightly to help sleep    Lorazepam 1 Mg Tabs (Lorazepam) .Marland Kitchen... Take 1 by mouth two times a day as needed nerves  Problem # 3:  HYPERTENSION (ICD-401.9) Assessment: Unchanged good control  no changes needed  Her updated medication list for this problem includes:    Amlodipine Besylate 5 Mg Tabs (Amlodipine besylate) .Marland Kitchen... Take 1 tablet by mouth  once a day  BP today: 120/80 Prior BP: 122/80 (02/09/2010)  Labs Reviewed: K+: 4.8 (07/01/2009) Creat: : 0.8 (07/01/2009)   Chol: 213 (12/20/2007)   HDL: 41.9 (12/20/2007)   LDL: DEL (12/20/2007)   TG: 47 (12/20/2007)  Problem # 4:  GERD (ICD-530.81) Assessment: Unchanged okay with med  Her updated medication list for this problem includes:    Prilosec 20 Mg Cpdr (Omeprazole) .Marland Kitchen... Take 1 tablet by mouth two times a day  Complete  Medication List: 1)  Lo/ovral (28) 0.3-30 Mg-mcg Tabs (Norgestrel-ethinyl estradiol) .... Take one by mouth daily 2)  Fluoxetine Hcl 40 Mg Caps (Fluoxetine hcl) .... Take one by mouth daily 3)  Trazodone Hcl 50 Mg Tabs (Trazodone hcl) .... 2-3 at bedtime nightly to help sleep 4)  Amlodipine Besylate 5 Mg Tabs (Amlodipine besylate) .... Take 1 tablet by mouth once a day 5)  Vicodin 5-500 Mg Tabs (Hydrocodone-acetaminophen) .Marland Kitchen.. 1 four times daily as needed severe pain 6)  Prilosec 20 Mg Cpdr (Omeprazole) .... Take 1 tablet by mouth two times a day 7)  Fexofenadine Hcl 180 Mg Tabs (Fexofenadine hcl) .... Take 1 tablet by mouth once daily as needed 8)  Lorazepam 1 Mg Tabs (Lorazepam) .... Take 1 by mouth two times a day as needed nerves  Patient Instructions: 1)  Please schedule a follow-up appointment in 4 months for physical Prescriptions: LORAZEPAM 1 MG TABS (LORAZEPAM) take 1 by mouth two times a day as needed nerves  #60 x 1   Entered and Authorized by:   Cindee Salt MD   Signed by:   Cindee Salt MD on 05/14/2010   Method used:   Print then Give to Patient   RxID:   [id number] VICODIN 5-500 MG TABS (HYDROCODONE-ACETAMINOPHEN) 1 four times daily as needed severe pain  #60 x 1   Entered and Authorized by:   Cindee Salt MD   Signed by:   Cindee Salt MD on 05/14/2010   Method used:   Print then Give to Patient   RxID:   [id number] FLUOXETINE HCL 40 MG CAPS (FLUOXETINE HCL) Take one by mouth daily  #30 Each x 12   Entered by:   Mervin Hack CMA (AAMA)   Authorized by:   Cindee Salt MD   Signed by:   Mervin Hack CMA (AAMA) on 05/14/2010   Method used:   Electronically to        [facility] [facility] [phone]* (retail)       [address]       [place], [place]  [address]       Ph: [phone]       Fax: [phone]   RxID:   [id number]   Current Allergies (reviewed today): ! * WELLBUTRIN XL * EFFEXOR REMERON (MIRTAZAPINE) LISINOPRIL  (LISINOPRIL)

## 2010-12-22 NOTE — Progress Notes (Signed)
Summary: needs order for diagnostic mammogram now  Phone Note From Other Clinic   [name]: [facility] Summary of Call: Pt is at [facility] for screening mammogram, but because she has a problem they need order for diagnostic mammogram.  They are waiting for this. Initial call taken by: Lowella Petties CMA, AAMA,  September 22, 2010 10:42 AM  Follow-up for Phone Call        input into note.  please find me or Selena Batten if issue like this with patient waiting. Follow-up by: Eustaquio Boyden  MD,  September 22, 2010 11:09 AM  Additional Follow-up for Phone Call Additional follow up Details #1::         Order faxed to [facility]. Additional Follow-up by: Lowella Petties CMA, AAMA,  September 22, 2010 11:38 AM

## 2010-12-22 NOTE — Assessment & Plan Note (Signed)
Summary: F/U/CLE   Vital Signs:  Patient profile:   45 year old female Weight:      144 pounds Temp:     98.4 degrees F oral Pulse rate:   72 / minute Pulse rhythm:   regular BP sitting:   122 / 80  (left arm) Cuff size:   regular  Vitals Entered By: Mervin Hack CMA Duncan Dull) (February 09, 2010 4:18 PM) CC: follow-up visit   History of Present Illness: couldn't take the bupropion bad reaction  Mood has been worse Husband sick with liver cancer!! Getting some chemo---but he is not doing well  STill taking the fluoxetine sleeps some if she takes 2-3 trazodone Using the lorazepam two times a day regularly  Pain issues are not quite as bad Mostly a problem in neck--hard to turn to right  Doesn't feel she would want counsellor "i am a very private person and it is hard to talk to people" Not involved with a church Some spritual belief--he is having troubles facing death  Does think about dying herself but no real suicidal ideation Expecting 1st grandchild soon  No chest pain No SOB gets palpitations though  Allergies: 1)  ! * Wellbutrin Xl 2)  * Effexor 3)  Remeron (Mirtazapine) 4)  Lisinopril (Lisinopril)  Past History:  Past medical, surgical, family and social histories (including risk factors) reviewed for relevance to current acute and chronic problems.  Past Medical History: Reviewed history from 12/20/2007 and no changes required. Allergic rhinitis Depression GERD Hypertension Migraines Hyperlipidemia-mild  Past Surgical History: Reviewed history from 06/19/2008 and no changes required. Stress echo, sl. MVP 06/04 EGD 05/05/2006 5/09 Lap chole  Family History: Reviewed history from 12/20/2007 and no changes required. Mom and Dad with HTN Dad had MI in his 46's Dad has DM 2 brothers--1 with HTN No breast or colon cancer Lung cancer in pat [place]  Social History: Reviewed history from 04/03/2007 and no changes required. Occupation: supvr-[employer] Married--1 child Former Smoker--quit 1987 Alcohol use-no  Review of Systems       eating "some" weight is down 6#  Physical Exam  General:  alert and normal appearance.   Neck:  supple, no masses, and no thyromegaly.   Lungs:  normal respiratory effort and normal breath sounds.   Heart:  normal rate, regular rhythm, no murmur, and no gallop.   Extremities:  no edema Psych:  normally interactive, depressed affect, and tearful.     Impression & Recommendations:  Problem # 1:  DEPRESSION (ICD-311) Assessment Deteriorated but due to husband's incurable liver cancer discussed options--unlikely meds will help this part of it so will just continue current meds  Her updated medication list for this problem includes:    Fluoxetine Hcl 40 Mg Caps (Fluoxetine hcl) .Marland Kitchen... Take one by mouth daily    Trazodone Hcl 50 Mg Tabs (Trazodone hcl) .Marland Kitchen... 2-3 at bedtime nightly to help sleep    Lorazepam 1 Mg Tabs (Lorazepam) .Marland Kitchen... Take 1 by mouth two times a day as needed nerves  Problem # 2:  HYPERTENSION (ICD-401.9) Assessment: Unchanged fine on current meds Her updated medication list for this problem includes:    Amlodipine Besylate 5 Mg Tabs (Amlodipine besylate) .Marland Kitchen... Take 1 tablet by mouth once a day  BP today: 122/80 Prior BP: 128/90 (07/01/2009)  Labs Reviewed: K+: 4.8 (07/01/2009) Creat: : 0.8 (07/01/2009)   Chol: 213 (12/20/2007)   HDL: 41.9 (12/20/2007)   LDL: DEL (12/20/2007)   TG: 47 (12/20/2007)  Problem # 3:  GERD (ICD-530.81) Assessment: Unchanged generally doing okay esp considering her stress  Her updated medication list for this problem includes:    Prilosec 20 Mg Cpdr (Omeprazole) .Marland Kitchen... Take 1 tablet by mouth two times a day  Complete Medication List: 1)  Lo/ovral (28) 0.3-30 Mg-mcg Tabs (Norgestrel-ethinyl estradiol) .... Take one by mouth daily 2)  Fluoxetine Hcl 40 Mg Caps (Fluoxetine hcl) .... Take one by mouth daily 3)  Trazodone Hcl 50 Mg Tabs  (Trazodone hcl) .... 2-3 at bedtime nightly to help sleep 4)  Amlodipine Besylate 5 Mg Tabs (Amlodipine besylate) .... Take 1 tablet by mouth once a day 5)  Vicodin 5-500 Mg Tabs (Hydrocodone-acetaminophen) .Marland Kitchen.. 1 four times daily as needed severe pain 6)  Prilosec 20 Mg Cpdr (Omeprazole) .... Take 1 tablet by mouth two times a day 7)  Fexofenadine Hcl 180 Mg Tabs (Fexofenadine hcl) .... Take 1 tablet by mouth once daily as needed 8)  Lorazepam 1 Mg Tabs (Lorazepam) .... Take 1 by mouth two times a day as needed nerves  Patient Instructions: 1)  Please schedule a follow-up appointment in 3 months .  Prescriptions: FEXOFENADINE HCL 180 MG  TABS (FEXOFENADINE HCL) Take 1 tablet by mouth once daily as needed  #30 Each x 12   Entered by:   Mervin Hack CMA (AAMA)   Authorized by:   Cindee Salt MD   Signed by:   Mervin Hack CMA (AAMA) on 02/09/2010   Method used:   Electronically to        [facility] [address] [phone]* (retail)       [address]       [place], [place]  [address]       Ph: [phone]       Fax: [phone]   RxID:   [id number] PRILOSEC 20 MG  CPDR (OMEPRAZOLE) Take 1 tablet by mouth two times a day  #60 Each x 12   Entered by:   Mervin Hack CMA (AAMA)   Authorized by:   Cindee Salt MD   Signed by:   Mervin Hack CMA (AAMA) on 02/09/2010   Method used:   Electronically to        [facility] [address] [phone]* (retail)       [address]       [place], [place]  [address]       Ph: [phone]       Fax: [phone]   RxID:   [id number] AMLODIPINE BESYLATE 5 MG TABS (AMLODIPINE BESYLATE) Take 1 tablet by mouth once a day  #30 Each x 12   Entered by:   Mervin Hack CMA (AAMA)   Authorized by:   Cindee Salt MD   Signed by:   Mervin Hack CMA (AAMA) on 02/09/2010   Method used:   Electronically to        [facility] [address] [phone]* (retail)       [address]       [place], [place]  [address]       Ph: [phone]       Fax: [phone]   RxID:    [id number] LO/OVRAL (28) 0.3-30 MG-MCG TABS (NORGESTREL-ETHINYL ESTRADIOL) Take one by mouth daily  #28 Each x 12   Entered by:   Mervin Hack CMA (AAMA)   Authorized by:   Cindee Salt MD   Signed by:   Mervin Hack CMA (AAMA) on 02/09/2010   Method used:   Electronically to  [facility] [address] [phone]* (retail)       [address]       [place], [place]  [address]       Ph: [phone]       Fax: [phone]   RxID:   [id number]   Current Allergies (reviewed today): ! * WELLBUTRIN XL * EFFEXOR REMERON (MIRTAZAPINE) LISINOPRIL (LISINOPRIL)

## 2010-12-22 NOTE — Progress Notes (Signed)
Summary: rx vicodan  Phone Note Refill Request Message from:  [facility] on September 13, 2008 7:59 AM  Refills Requested: Medication #1:  VICODIN 5-500 MG TABS 1 four times daily as needed severe pain   Last Refilled: 07/25/[facility] [address] [id number]  Initial call taken by: Providence Crosby,  September 13, 2008 7:59 AM  Follow-up for Phone Call        okay #60 x0 Follow-up by: Cindee Salt MD,  September 13, 2008 9:10 AM  Additional Follow-up for Phone Call Additional follow up Details #1::        prescribtion called to pharmacy Additional Follow-up by: Providence Crosby,  September 13, 2008 9:18 AM      Prescriptions: VICODIN 5-500 MG TABS (HYDROCODONE-ACETAMINOPHEN) 1 four times daily as needed severe pain  #60 x 0   Entered by:   Providence Crosby   Authorized by:   Cindee Salt MD   Signed by:   Providence Crosby on 09/13/2008   Method used:   Handwritten   RxID:   [id number]

## 2010-12-22 NOTE — Assessment & Plan Note (Signed)
Summary: cpx/rbh   Vital Signs:  Patient Profile:   45 Years Old Female Weight:      149 pounds Temp:     98.6 degrees F oral Pulse rate:   76 / minute Pulse rhythm:   regular BP sitting:   128 / 80  (left arm) Cuff size:   regular  Vitals Entered By: Mervin Hack CMA (December 24, 2008 9:49 AM)                 Chief Complaint:  adult physical.  History of Present Illness: Generally okay Stays thirsty weight is stable  Nerves are stable-good days and bad days Some depression that may a week--not severe Still gets "ill" with people No violent tendencies or thoughts of suicide or death Still uses lorazepam regularly at night and as needed during the day (not regularly)  Still with neck pain at times Occ right hip and wrist pain Occ uses vicodin--only if it gets really bad    Current Allergies (reviewed today): * EFFEXOR REMERON (MIRTAZAPINE) LISINOPRIL (LISINOPRIL)  Past Medical History:    Reviewed history from 12/20/2007 and no changes required:       Allergic rhinitis       Depression       GERD       Hypertension       Migraines       Hyperlipidemia-mild  Past Surgical History:    Reviewed history from 06/19/2008 and no changes required:       Stress echo, sl. MVP 06/04       EGD 05/05/2006       5/09 Lap chole   Family History:    Reviewed history from 12/20/2007 and no changes required:       Mom and Dad with HTN       Dad had MI in his 108's       Dad has DM       2 brothers--1 with HTN       No breast or colon cancer       Lung cancer in pat [place]  Social History:    Reviewed history from 04/03/2007 and no changes required:       Occupation: [employer]vr-[employer]       Married--1 child       Former Smoker--quit 1987       Alcohol use-no    Review of Systems  General      sleeps okay with lorazepam uses seat belt weight stable  Eyes      Complains of blurring.      Denies double vision and vision loss-1 eye.      due for  eye exam  ENT      Complains of ringing in ears.      Denies decreased hearing.      rare tinnitus if stopped up in head  CV      Complains of palpitations.      Denies chest pain or discomfort, difficulty breathing at night, difficulty breathing while lying down, fainting, lightheadness, and shortness of breath with exertion.      palpitations at times with anxiety Occ mild dizziness  Resp      Complains of cough.      Denies shortness of breath.      chronic cough--relates to allergies  GI      Complains of indigestion.      Denies abdominal pain, bloody stools, change in bowel habits, dark  tarry stools, nausea, and vomiting.      heartburn if she misses meds  GU      Complains of incontinence.      Denies dysuria.      occ stress incontinence--not bad enough for pad Periods regular--light, 2 days only No sex in relationship--not really a problem  MS      Complains of joint pain.      Denies joint swelling.  Derm      Complains of dryness.      Denies lesion(s) and rash.  Neuro      Complains of headaches.      Denies numbness, tingling, and weakness.      tension headaches in neck at times headaches premenstrual also  Psych      Complains of anxiety and depression.  Heme      Denies abnormal bruising and enlarge lymph nodes.  Allergy      Complains of seasonal allergies and sneezing.      allegra helps   Physical Exam  General:     alert and normal appearance.   Eyes:     pupils equal, pupils round, pupils reactive to light, and no optic disk abnormalities.   Ears:     R ear normal and L ear normal.   Mouth:     no erythema and no lesions.   Neck:     supple, no masses, no thyromegaly, no carotid bruits, and no cervical lymphadenopathy.   Breasts:     no masses and no adenopathy.   Lungs:     normal respiratory effort and normal breath sounds.   Heart:     normal rate, regular rhythm, no murmur, and no gallop.   Abdomen:     soft,  non-tender, and no masses.   Genitalia:     normal introitus, no vaginal or cervical lesions, no friaility or hemorrhage, normal uterus size and position, and no adnexal masses or tenderness.   Msk:     no joint tenderness and no joint swelling.   Pulses:     1= in feet Extremities:     no edema Neurologic:     alert & oriented X3 and strength normal in all extremities.   Skin:     no rashes and no suspicious lesions.   Psych:     normally interactive, good eye contact, not anxious appearing, and not depressed appearing.      Impression & Recommendations:  Problem # 1:  PREVENTIVE HEALTH CARE (ICD-V70.0) Assessment: Comment Only doing well will recheck sugar since she has thirst--all other labs fine 5/09 will set up mammo  Problem # 2:  HYPERTENSION (ICD-401.9) Assessment: Unchanged good control no changes needed  Her updated medication list for this problem includes:    Amlodipine Besylate 5 Mg Tabs (Amlodipine besylate) .Marland Kitchen... Take 1 tablet by mouth once a day  BP today: 128/80 Prior BP: 120/80 (06/19/2008)  Labs Reviewed: Creat: 0.9 (04/02/2008) Chol: 213 (12/20/2007)   HDL: 41.9 (12/20/2007)   LDL: 154.6 (12/20/2007)   TG: 47 (12/20/2007)  Orders: Venipuncture (08657) TLB-Renal Function Panel (80069-RENAL)   Problem # 3:  DEPRESSION (ICD-311) Assessment: Unchanged controlled fairly well with meds continuing anxiety component no changes needed  Her updated medication list for this problem includes:    Fluoxetine Hcl 40 Mg Caps (Fluoxetine hcl) .Marland Kitchen... Take one by mouth daily    Trazodone Hcl 50 Mg Tabs (Trazodone hcl) .Marland Kitchen... 2-3 at bedtime nightly to help sleep  Lorazepam 0.5 Mg Tabs (Lorazepam) .Marland Kitchen... 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 1  four times daily as needed severe pain   Complete Medication List: 1)  Lo/ovral (28) 0.3-30 Mg-mcg Tabs (Norgestrel-ethinyl estradiol) .... Take one by mouth daily 2)  Fluoxetine Hcl 40 Mg Caps (Fluoxetine hcl) .... Take one by mouth daily 3)  Trazodone Hcl 50 Mg Tabs (Trazodone hcl) .... 2-3 at bedtime nightly to help sleep 4)  Amlodipine Besylate 5 Mg Tabs (Amlodipine besylate) .... Take 1 tablet by mouth once a day 5)  Vicodin 5-500 Mg Tabs (Hydrocodone-acetaminophen) .Marland Kitchen.. 1 four times daily as needed severe pain 6)  Lorazepam 0.5 Mg Tabs (Lorazepam) .... Take 1/2-1 by mouth bid 7)  Prilosec 20 Mg Cpdr (Omeprazole) .... Take 1 tablet by mouth two times a day 8)  Fexofenadine Hcl 180 Mg Tabs (Fexofenadine hcl) .... Take 1 tablet by mouth once daily as needed  Other Orders: Radiology Referral (Radiology)   Patient Instructions: 1)  Please schedule a follow-up appointment in 6 months. 2)  Schedule your mammogram.    Current Allergies (reviewed today): * EFFEXOR REMERON (MIRTAZAPINE) LISINOPRIL (LISINOPRIL)

## 2010-12-22 NOTE — Assessment & Plan Note (Signed)
Summary: 8:00 STOMACH PAIN/CLE   Vital Signs:  Patient Profile:   45 Years Old Female Weight:      147.50 pounds Temp:     100.5 degrees F oral Pulse rate:   76 / minute BP sitting:   114 / 86  (left arm)  Vitals Entered By: Wandra Mannan (Apr 02, 2008 8:23 AM)                 Chief Complaint:  stomaach pain.  History of Present Illness: Having soreness across entire upper abdomen sore to touch Going on for 2 weeks but seems to be worsening Fairly constant--worse after eating (regardless of meal) Nausea, "just hurts" No vomiting No change in bowels or blood in stool appetite is okay  Felt hot yesterday No known fever though    Current Allergies (reviewed today): * EFFEXOR REMERON (MIRTAZAPINE) LISINOPRIL (LISINOPRIL)  Past Medical History:    Reviewed history from 12/20/2007 and no changes required:       Allergic rhinitis       Depression       GERD       Hypertension       Migraines       Hyperlipidemia-mild  Past Surgical History:    Reviewed history from 12/20/2007 and no changes required:       Stress echo, sl. MVP 06/04       EGD 05/05/2006   Social History:    Reviewed history from 04/03/2007 and no changes required:       Occupation: [employer]vr-[employer]       Married--1 child       Former Smoker--quit 1987       Alcohol use-no    Review of Systems       weight down 3# since last visit No change in diet No cough Gets easy DOE--relates to being out of shape chronic heartburn--generally controlled on prilosec   Physical Exam  General:     alert and normal appearance.   Neck:     supple, no masses, and no cervical lymphadenopathy.   Lungs:     normal respiratory effort and normal breath sounds.   Heart:     normal rate and regular rhythm.   Abdomen:     soft and normal bowel sounds.   Mildly postive Murphy's sign Mild RUQ and epigastric sensitivity Extremities:     no edema or calf tenderness Neurologic:     alert &  oriented X3 and strength normal in all extremities.   Psych:     normally interactive, good eye contact, not anxious appearing, and not depressed appearing.      Impression & Recommendations:  Problem # 1:  ABDOMINAL PAIN, ACUTE (ICD-789.00) Assessment: New most likely possibility is gallstones or low level cholecystitis nothing to suggest she needs antibiotics now Other possibilities are gastritis, ulcer,  pancreatitis, reflux---all much less likely  P: will check labs including lipase, H pylori     RUQ ultrasound     If positive, surgeon in [place]    small meals for now, avoid fatty foods    continue prilosec      Orders: Radiology Referral (Radiology) Venipuncture (16109) TLB-CBC Platelet - w/Differential (85025-CBCD) TLB-Hepatic/Liver Function Pnl (80076-HEPATIC) TLB-Renal Function Panel (80069-RENAL) TLB-Amylase (82150-AMYL) TLB-Lipase (83690-LIPASE) TLB-H. Pylori Abs(Helicobacter Pylori) (86677-HELICO)   Complete Medication List: 1)  Lo/ovral (28) 0.3-30 Mg-mcg Tabs (Norgestrel-ethinyl estradiol) .... Take one by mouth daily 2)  Fluoxetine Hcl 40 Mg Caps (Fluoxetine hcl) .Marland KitchenMarland KitchenMarland Kitchen  Take one by mouth daily 3)  Trazodone Hcl 50 Mg Tabs (Trazodone hcl) 4)  Amlodipine Besylate 5 Mg Tabs (Amlodipine besylate) .... Take 1 tablet by mouth once a day 5)  Vicodin 5-500 Mg Tabs (Hydrocodone-acetaminophen) .Marland Kitchen.. 1 four times daily as needed severe pain 6)  Lorazepam 0.5 Mg Tabs (Lorazepam) .... Take 1/2-1 by mouth bid 7)  Prilosec 20 Mg Cpdr (Omeprazole) .... Take 1 tablet by mouth two times a day   Patient Instructions: 1)  Set up abdominal ultrasound 2)  continue prilosec 3)  Small, low fat meals 4)  Please schedule a follow-up appointment as needed if not gallbladder and symptoms continue   ] Current Allergies (reviewed today): * EFFEXOR REMERON (MIRTAZAPINE) LISINOPRIL (LISINOPRIL) Current Medications (including changes made in today's visit):  LO/OVRAL (28) 0.3-30  MG-MCG TABS (NORGESTREL-ETHINYL ESTRADIOL) Take one by mouth daily FLUOXETINE HCL 40 MG CAPS (FLUOXETINE HCL) Take one by mouth daily TRAZODONE HCL 50 MG TABS (TRAZODONE HCL)  AMLODIPINE BESYLATE 5 MG TABS (AMLODIPINE BESYLATE) Take 1 tablet by mouth once a day VICODIN 5-500 MG TABS (HYDROCODONE-ACETAMINOPHEN) 1 four times daily as needed severe pain LORAZEPAM 0.5 MG TABS (LORAZEPAM) Take 1/2-1 by mouth bid PRILOSEC 20 MG  CPDR (OMEPRAZOLE) Take 1 tablet by mouth two times a day

## 2010-12-22 NOTE — Progress Notes (Signed)
Summary: VICODIN  Phone Note [phone] on July 17, 2010 3:46 PM  Refills Requested: Medication #1:  VICODIN 5-500 MG TABS 1 four times daily as needed severe pain   Last Refilled: 06/14/2010 [facility]ribe Request    Method Requested: Telephone to Pharmacy Initial call taken by: Mervin Hack CMA Duncan Dull),  July 17, 2010 3:46 PM  Follow-up for Phone Call        okay #60 x 1 Follow-up by: Cindee Salt MD,  July 20, 2010 1:47 PM  Additional Follow-up for Phone Call Additional follow up Details #1::        Rx called to pharmacy Additional Follow-up by: DeShannon Smith CMA Duncan Dull),  July 20, 2010 2:15 PM    Prescriptions: VICODIN 5-500 MG TABS (HYDROCODONE-ACETAMINOPHEN) 1 four times daily as needed severe pain  #60 x 1   Entered by:   Mervin Hack CMA (AAMA)   Authorized by:   Cindee Salt MD   Signed by:   Mervin Hack CMA (AAMA) on 07/20/2010   Method used:   Telephoned to ...       [facility] [phone]* (retail)       [address]       [place], [place]  [address]       Ph: [phone]       Fax: [phone]   RxID:   [id number]

## 2010-12-22 NOTE — Progress Notes (Signed)
Summary: rx  Phone Note Call from Patient Call back at Home Phone [phone]   [name]: Patient Call For: letvak Summary of Call: wants rx for allegra, she has rx for allegra D but it is to expensive since no generic. Initial call taken by: Liane Comber,  May 03, 2007 10:34 AM  Follow-up for Phone Call        okay--fexofenadine 180 daily as needed for allergies #30 x 1 year Follow-up by: Cindee Salt MD,  May 03, 2007 2:05 PM  Additional Follow-up for Phone Call Additional follow up Details #1::        [facility] [place] [phone] Additional Follow-up by: Liane Comber,  May 03, 2007 2:34 PM

## 2010-12-22 NOTE — Progress Notes (Signed)
Summary: Rx-Fexofenadine  Phone Note Refill Request Call back at [phone] [facility]:  [id number] on May 20, 2008 8:19 AM  Refill request sent electronically for Fexofenadine 180mg  1 tab every day as needed #30(12). Medication is not on med list.  Initial call taken by: Silas Sacramento,  May 20, 2008 8:20 AM      Prescriptions: LORAZEPAM 0.5 MG TABS (LORAZEPAM) Take 1/2-1 by mouth bid  #60 x 0   Entered and Authorized by:   Shaune Leeks MD   Signed by:   Shaune Leeks MD on 05/20/2008   Method used:   Electronically sent to ...       [id number]       [address]       [place], [place]  [address]       Ph: [phone]       Fax: [phone]   RxID:   [id number]

## 2010-12-22 NOTE — Progress Notes (Signed)
Summary: vicodin rx  Phone Note Refill Request Message from:  Patient on November 28, 2007 9:03 AM  Refills Requested: Medication #1:  VICODIN 5-500 MG TABS 1 four times daily as needed severe pain Initial call taken by: Liane Comber,  November 28, 2007 9:03 AM  Follow-up for Phone Call        okay #60 x 0 Probably due for PE soon--have her schedule at her convenience Follow-up by: Cindee Salt MD,  November 28, 2007 1:50 PM  Additional Follow-up for Phone Call Additional follow up Details #1::        Need pharmacy ..................................................................Marland KitchenSydell Axon  November 28, 2007 2:38 PM   [facility] [place] [facility] ..................................................................Marland KitchenMarcelle Smiling Chavers  November 28, 2007 5:04 PM       Prescriptions: VICODIN 5-500 MG TABS (HYDROCODONE-ACETAMINOPHEN) 1 four times daily as needed severe pain  #60 x 0   Entered by:   Sydell Axon   Authorized by:   Cindee Salt MD   Signed by:   Liane Comber on 11/28/2007   Method used:   Telephoned to ...         RxID:   [id number]

## 2010-12-22 NOTE — Progress Notes (Signed)
Summary: regarding bupropion  Phone Note From Pharmacy   [name]: [facility] [address] [id number]*  [phone] Summary of Call: Pharmacist called to say that she received an e-script for bupropion xr but she says there is no xr, there is an XL and an SR, please advise. Initial call taken by: Lowella Petties CMA,  July 01, 2009 10:40 AM  Follow-up for Phone Call        The 24 hour one I sent what was on the computer so I didn't make it up As long as it is generic, the 24 hour one Follow-up by: Cindee Salt MD,  July 01, 2009 11:46 AM  Additional Follow-up for Phone Call Additional follow up Details #1::        spoke with the pharmacist and corrected it and resent. Additional Follow-up by: Mervin Hack CMA,  July 01, 2009 11:55 AM    New/Updated Medications: BUDEPRION SR 150 MG XR12H-TAB (BUPROPION HCL) take 1 by mouth once daily for depression Prescriptions: BUDEPRION SR 150 MG XR12H-TAB (BUPROPION HCL) take 1 by mouth once daily for depression  #30 x 5   Entered by:   Mervin Hack CMA   Authorized by:   Cindee Salt MD   Signed by:   Mervin Hack CMA on 07/01/2009   Method used:   Electronically to        [facility] [address] [phone]* (retail)       [address]       [place], [place]  [address]       Ph: [phone]       Fax: [phone]   RxID:   [id number]

## 2010-12-22 NOTE — Progress Notes (Signed)
Summary: refill request for vicodin  Phone Note Refill Request Message from:  [employer]  Refills Requested: Medication #1:  VICODIN 5-500 MG TABS 1 four times daily as needed severe pain   Dosage confirmed as above?Dosage Confirmed   Brand Name Necessary? No   Supply Requested: 1 month Electronic refill request from [facility].  Initial call taken by: Lowella Petties,  January 10, 2009 10:19 AM  Follow-up for Phone Call        ok Follow-up by: Hannah Beat MD,  January 10, 2009 11:27 AM  Additional Follow-up for Phone Call Additional follow up Details #1::        [facility]ed to [facility] [place]. Additional Follow-up by: Lowella Petties,  January 10, 2009 11:41 AM

## 2010-12-22 NOTE — Progress Notes (Signed)
Summary: Nerves  Phone Note Call from Patient Call back at [phone]   [name]: Patient Call For: Cindee Salt MD Summary of Call: Pt states she needs something stronger for her nerves, she just found out her husband has cancer. Please advise. Initial call taken by: Mervin Hack CMA Duncan Dull),  October 29, 2009 4:13 PM  Follow-up for Phone Call        okay to send rx for lorazepam 1mg   #60 x 0 1  two times a day as needed for nerves Have her set up appt if still having problems next week  Let her know I am sorry about that news Follow-up by: Cindee Salt MD,  October 29, 2009 6:01 PM  Additional Follow-up for Phone Call Additional follow up Details #1::        left message on machine that rx ready for pick-up and for pt to schedukle f/u. Rx called in. Additional Follow-up by: Mervin Hack CMA Duncan Dull),  October 30, 2009 3:36 PM    New/Updated Medications: FLAREX 0.1 % SUSP (FLUOROMETHOLONE ACETATE)  LORAZEPAM 1 MG TABS (LORAZEPAM) take 1 by mouth two times a day as needed nerves Prescriptions: LORAZEPAM 1 MG TABS (LORAZEPAM) take 1 by mouth two times a day as needed nerves  #60 x 0   Entered by:   Mervin Hack CMA (AAMA)   Authorized by:   Cindee Salt MD   Signed by:   Mervin Hack CMA (AAMA) on 10/30/2009   Method used:   Telephoned to ...       [facility] [address] [phone]* (retail)       [address]       [place], [place]  [address]       Ph: [phone]       Fax: [phone]   RxID:   [id number]

## 2010-12-23 LAB — CONVERTED CEMR LAB: HCV Ab: NEGATIVE

## 2010-12-24 NOTE — Assessment & Plan Note (Signed)
Summary: NAUSEA   Vital Signs:  Patient profile:   45 year old female Height:      65 inches Weight:      144.50 pounds BMI:     24.13 Temp:     98.6 degrees F oral Pulse rate:   80 / minute Pulse rhythm:   regular BP sitting:   120 / 84  (right arm) Cuff size:   regular  Vitals Entered By: Linde Gillis CMA Duncan Dull) (November 06, 2010 11:34 AM) CC: nausea   History of Present Illness: 45 yo new to me with complaints of nausea.  Almost two weeks ago, developped nausea and dry heaving.  Gets generalized abdominal pain several times per day, very sharp in nature.  Worsened by eating.  Feels it is mainly her bilateral lower abdomen. No changes in her stools. Not getting any better, at first thought it was just a virus. She is s/p cholecystectomy years ago. Not sexaully active.  Takes Prilosec for GERD, does not help with nausea. Under great deal of stress. Husband has terminal liver CA. Lately, she has been worried that she will wake up and he will be dead next to her.   On Trazadone and Lorazepam.  Current Medications (verified): 1)  Lo/ovral (28) 0.3-30 Mg-Mcg Tabs (Norgestrel-Ethinyl Estradiol) .... Take One By Mouth Daily 2)  Fluoxetine Hcl 40 Mg Caps (Fluoxetine Hcl) .... Take One By Mouth Daily 3)  Trazodone Hcl 50 Mg Tabs (Trazodone Hcl) .... 2-3 At Bedtime Nightly To Help Sleep 4)  Amlodipine Besylate 5 Mg Tabs (Amlodipine Besylate) .... Take 1 Tablet By Mouth Once A Day 5)  Vicodin 5-500 Mg Tabs (Hydrocodone-Acetaminophen) .Marland Kitchen.. 1 Four Times Daily As Needed Severe Pain 6)  Prilosec 20 Mg  Cpdr (Omeprazole) .... Take 1 Tablet By Mouth Two Times A Day 7)  Fexofenadine Hcl 180 Mg  Tabs (Fexofenadine Hcl) .... Take 1 Tablet By Mouth Once Daily As Needed 8)  Lorazepam 1 Mg Tabs (Lorazepam) .... Take 1 By Mouth Two Times A Day As Needed Nerves 9)  Zofran 4 Mg Tabs (Ondansetron Hcl) .Marland Kitchen.. 1 By Mouth Every 8 Hours As Needed For Nausea  Allergies: 1)  ! * Wellbutrin Xl 2)  *  Effexor 3)  Remeron (Mirtazapine) 4)  Lisinopril (Lisinopril)  Past History:  Past Medical History: Last updated: 12/20/2007 Allergic rhinitis Depression GERD Hypertension Migraines Hyperlipidemia-mild  Past Surgical History: Last updated: 06/19/2008 Stress echo, sl. 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 GI:  Complains of abdominal pain, loss of appetite, nausea, and vomiting; denies bloody stools, change in bowel habits, constipation, diarrhea, gas, indigestion, vomiting blood, and yellowish skin color.  Physical Exam  General:  alert and normal appearance.   VSS, non toxic appearing. Mouth:  good dentition and no gingival abnormalities.   Abdomen:  soft, non-tender, and no masses.  normal bowel sounds, no distention, no masses, no guarding, and no rebound tenderness.   Psych:  normally interactive, good eye contact, depressed affect, and tearful.     Impression & Recommendations:  Problem # 1:  ABDOMINAL  PAIN, GENERALIZED (ICD-789.07) Assessment New Differential is wide- could be a virus although symptoms do not seem to be improving.  some concern for PUD, advised continuing Prilosec, will check for H.Pylori as well.  If symptoms do not improve by need week, consider GI referral for endoscopy. Will check Lipase, CBC, hepatic panel. Get abdominal U/S to rule out ovarian or pancreatic issues. Orders: Venipuncture (29562) TLB-CBC Platelet - w/Differential (85025-CBCD) TLB-Hepatic/Liver Function Pnl (80076-HEPATIC) TLB-Lipase (83690-LIPASE) TLB-H.  Pylori Abs(Helicobacter Pylori) (86677-HELICO) Radiology Referral (Radiology)  Complete Medication List: 1)  Lo/ovral (28) 0.3-30 Mg-mcg Tabs (Norgestrel-ethinyl estradiol) .... Take one by mouth daily 2)  Fluoxetine Hcl 40 Mg Caps (Fluoxetine hcl) .... Take one by mouth daily 3)  Trazodone Hcl 50 Mg Tabs (Trazodone hcl) .... 2-3 at bedtime nightly to help sleep 4)  Amlodipine Besylate 5 Mg Tabs (Amlodipine besylate) .... Take 1 tablet by mouth once a day 5)  Vicodin 5-500 Mg Tabs (Hydrocodone-acetaminophen) .Marland Kitchen.. 1 four times daily as needed severe pain 6)  Prilosec 20 Mg Cpdr (Omeprazole) .... Take 1 tablet by mouth two times a day 7)  Fexofenadine Hcl 180 Mg Tabs (Fexofenadine hcl) .... Take 1 tablet by mouth once daily as needed 8)  Lorazepam 1 Mg Tabs (Lorazepam) .... Take 1 by mouth two times a day as needed nerves 9)  Zofran 4 Mg Tabs (Ondansetron hcl) .Marland Kitchen.. 1 by mouth every 8 hours as needed for nausea  Patient Instructions: 1)  Please take your Prilosec twice daily. 2)  Zofran as needed for nausea. 3)  Please stop by to see Shirlee Limerick on your way out. 4)  If your symptoms worsen over the weekend, please go to the ER. Prescriptions: VICODIN 5-500 MG TABS (HYDROCODONE-ACETAMINOPHEN) 1 four times daily as needed severe pain  #60 x 0   Entered and Authorized by:   Ruthe Mannan MD   Signed by:   Ruthe Mannan MD on 11/06/2010   Method used:   Print then Give to Patient   RxID:   [id number] ZOFRAN 4 MG TABS (ONDANSETRON HCL) 1 by mouth every 8 hours as needed for nausea  #20 x 0   Entered and Authorized by:   Ruthe Mannan MD   Signed by:   Ruthe Mannan MD on 11/06/2010   Method used:   Print then Give to Patient   RxID:   [id number]    Orders Added: 1)  Venipuncture [64403] 2)  TLB-CBC Platelet - w/Differential [85025-CBCD] 3)  TLB-Hepatic/Liver Function Pnl [80076-HEPATIC] 4)  TLB-Lipase [83690-LIPASE] 5)  TLB-H. Pylori Abs(Helicobacter Pylori) [86677-HELICO] 6)  Radiology  Referral [Radiology] 7)  Est. Patient Level IV [47425]    Current Allergies (reviewed today): ! * WELLBUTRIN XL * EFFEXOR REMERON (MIRTAZAPINE) LISINOPRIL (LISINOPRIL)

## 2010-12-24 NOTE — Progress Notes (Signed)
Summary: refill requests for lorazepam, vicodin  Phone Note Refill Request Call back at Home Phone [phone] Message from:  Patient  Refills Requested: Medication #1:  VICODIN 5-500 MG TABS 1 four times daily as needed severe pain  Medication #2:  LORAZEPAM 1 MG TABS take 1 by mouth two times a day as needed nerves Phoned request from pt.  [facility]ses [facility] [place], [phone].  She said the pharmacy told her they have requested these twice, but we havent received anything from them.  Initial call taken by: Lowella Petties CMA, AAMA,  December 14, 2010 9:44 AM  Follow-up for Phone Call        okay for each #60 x 0 Follow-up by: Cindee Salt MD,  December 14, 2010 1:55 PM  Additional Follow-up for Phone Call Additional follow up Details #1::        Rx called to pharmacy Additional Follow-up by: DeShannon Smith CMA Duncan Dull),  December 14, 2010 2:35 PM    Prescriptions: LORAZEPAM 1 MG TABS (LORAZEPAM) take 1 by mouth two times a day as needed nerves  #60 x 0   Entered by:   Mervin Hack CMA (AAMA)   Authorized by:   Cindee Salt MD   Signed by:   Mervin Hack CMA (AAMA) on 12/14/2010   Method used:   Telephoned to ...       [facility] [address] [phone]* (retail)       [address]       [place], [place]  [address]       Ph: [phone]       Fax: [phone]   RxID:   [id number] VICODIN 5-500 MG TABS (HYDROCODONE-ACETAMINOPHEN) 1 four times daily as needed severe pain  #60 x 0   Entered by:   Mervin Hack CMA (AAMA)   Authorized by:   Cindee Salt MD   Signed by:   Mervin Hack CMA (AAMA) on 12/14/2010   Method used:   Telephoned to ...       [facility] [address] [phone]* (retail)       [address]       [place], [place]  [address]       Ph: [phone]       Fax: [phone]   RxID:   [id number]

## 2010-12-24 NOTE — Progress Notes (Signed)
Summary:  VICODIN  Phone Note Refill Request Message from:  Jordan Hawks [phone] on November 25, 2010 4:45 PM  Refills Requested: Medication #1:  VICODIN 5-500 MG TABS 1 four times daily as needed severe pain   Last Refilled: 09/28/2010 [facility] PATIENT   Method Requested: Telephone to Pharmacy Initial call taken by: Mervin Hack CMA Duncan Dull),  November 25, 2010 4:46 PM  Follow-up for Phone Call        11/06/2010 prescription given by Dr. Dayton Martes.  This is too early. denied. Hannah Beat MD  November 25, 2010 4:57 PM  Follow-up by: Hannah Beat MD,  November 25, 2010 4:57 PM  Additional Follow-up for Phone Call Additional follow up Details #1::        Spoke with patient and advised results. Pt states she may have looked at the date wrong, she asked me to send to [facility]. DeShannon Smith CMA Duncan Dull)  November 26, 2010 9:04 AM   she did get Rx on 12/16 If she is having more problems and needs more medicine, she will need to set up appt Cindee Salt MD  November 27, 2010 5:48 PM   Patient advised results. Additional Follow-up by: Mervin Hack CMA (AAMA),  November 30, 2010 8:22 AM

## 2010-12-30 NOTE — Assessment & Plan Note (Signed)
Summary: CPX/DLO  R/S FROM 12/25/10   Vital Signs:  Patient profile:   45 year old female Height:      65 inches Weight:      144.50 pounds Temp:     98.2 degrees F oral Pulse rate:   74 / minute Pulse rhythm:   regular BP sitting:   146 / 90  (right arm) Cuff size:   regular  Vitals Entered By: Selena Batten Dance CMA (AAMA) (December 22, 2010 11:32 AM) CC: CPx with PAP   History of Present Illness: "I'm doing...."  Husband on oral therapy for liver cancer---has reevaluation soon Tolerates this reasonably well Not much appetite  Not holding up emotionally  Depressed daily Gets angry at times--tries to control by going into another room and crying  Still having night sweats Trouble sleeping  Awakens with nausea -- though doesn't vomit May settle or continue. Food doesn't help Has to use the lorazepam two times a day every day  Doesn't check BP   Allergies: 1)  ! * Wellbutrin Xl 2)  Remeron (Mirtazapine) 3)  Lisinopril (Lisinopril)  Past History:  Past medical, surgical, family and social histories (including risk factors) reviewed for relevance to current acute and chronic problems.  Past Medical History: Reviewed history from 12/20/2007 and no changes required. Allergic rhinitis Depression GERD Hypertension Migraines Hyperlipidemia-mild  Past Surgical History: Reviewed history from 06/19/2008 and no changes required. Stress echo, sl. MVP 06/04 EGD 05/05/2006 5/09 Lap chole  Family History: Reviewed history from 12/20/2007 and no changes required. Mom and Dad with HTN Dad had MI in his 63's Dad has DM 2 brothers--1 with HTN No breast or colon cancer Lung cancer in pat GM  Social History: Reviewed history from 04/03/2007 and no changes required. 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 Eyes:  Denies double  vision and vision loss-1 eye. ENT:  Denies decreased hearing and ringing in ears; some itching in ears teeth okay--regular with dentist. CV:  Complains of chest pain or discomfort and palpitations; denies difficulty breathing at night, difficulty breathing while lying down, fainting, lightheadness, and shortness of breath with exertion; has noted chest pain and more flutters with stress. Resp:  Complains of cough and shortness of breath; gets SOB with anxiety. GI:  Complains of indigestion and nausea; denies abdominal pain, bloody stools, change in bowel habits, dark tarry stools, and vomiting; still with upper abdomen discomfort---similar. GU:  Denies dysuria and incontinence; Periods still fairly regular and light No sex now. MS:  Complains of joint pain; denies joint swelling; some pain in fingers. Derm:  Denies lesion(s) and rash. Neuro:  Complains of headaches and tingling; denies numbness and weakness; occ tingling in tips of fingers with nerves headaches with stress. Psych:  Complains of anxiety and depression. Heme:  Denies abnormal bruising and enlarge lymph nodes. Allergy:  Complains of seasonal allergies and sneezing; does okay with her meds--esp fexofenadine D.  Physical Exam  General:  alert and normal appearance.   Eyes:  pupils equal, pupils round, pupils reactive to light, and no optic disk abnormalities.   Ears:  R ear normal and L ear normal.   Mouth:  no erythema, no exudates, and no lesions.   Neck:  supple, no masses, no thyromegaly, no carotid bruits, and no cervical lymphadenopathy.   Breasts:  recently done Lungs:  normal respiratory effort, no intercostal retractions, no accessory  muscle use, and normal breath sounds.   Heart:  normal rate, regular rhythm, no murmur, and no gallop.   Abdomen:  soft, non-tender, and no masses.   Genitalia:  pap due 2013 Msk:  no joint tenderness and no joint swelling.   Pulses:  1+ in feet Extremities:  no edema Neurologic:   alert & oriented X3, strength normal in all extremities, and gait normal.   Skin:  no rashes and no suspicious lesions.   Axillary Nodes:  No palpable lymphadenopathy Psych:  normally interactive, good eye contact, depressed affect, and slightly anxious.     Impression & Recommendations:  Problem # 1:  PREVENTIVE HEALTH CARE (ICD-V70.0) Assessment Comment Only no acute findings just had mammo Pap due 2013  Problem # 2:  DEPRESSION (ICD-311) Assessment: Deteriorated along with her situational anxiety husband with liver cancer and on Rx but known poor prognosis also has night sweats which are disturbing  P: will add venalfaxine for adjunctive therapy    discussed possible side effects  Her updated medication list for this problem includes:    Fluoxetine Hcl 40 Mg Caps (Fluoxetine hcl) .Marland Kitchen... Take one by mouth daily    Trazodone Hcl 50 Mg Tabs (Trazodone hcl) .Marland Kitchen... 2-3 at bedtime nightly to help sleep    Lorazepam 1 Mg Tabs (Lorazepam) .Marland Kitchen... Take 1 by mouth two times a day as needed nerves    Venlafaxine Hcl 75 Mg Xr24h-cap (Venlafaxine hcl) .Marland Kitchen... 1 cap daily for anxiety and depression  Problem # 3:  HYPERTENSION (ICD-401.9) Assessment: Unchanged  reasonable control  no changes  Her updated medication list for this problem includes:    Amlodipine Besylate 5 Mg Tabs (Amlodipine besylate) .Marland Kitchen... Take 1 tablet by mouth once a day  BP today: 146/90 Prior BP: 120/84 (11/06/2010)  Labs Reviewed: K+: 4.2 (09/25/2010) Creat: : 0.9 (09/25/2010)   Chol: 213 (12/20/2007)   HDL: 41.9 (12/20/2007)   LDL: DEL (12/20/2007)   TG: 47 (12/20/2007)  Orders: TLB-Renal Function Panel (80069-RENAL) TLB-CBC Platelet - w/Differential (85025-CBCD) TLB-TSH (Thyroid Stimulating Hormone) (84443-TSH)  Problem # 4:  GERD (ICD-530.81) Assessment: Comment Only ongoing abdominal symptoms that may be stress provoked no changes for now will check Hep C status since husband had this  Her updated  medication list for this problem includes:    Prilosec 20 Mg Cpdr (Omeprazole) .Marland Kitchen... Take 1 tablet by mouth two times a day  Complete Medication List: 1)  Lo/ovral (28) 0.3-30 Mg-mcg Tabs (Norgestrel-ethinyl estradiol) .... Take one by mouth daily 2)  Fluoxetine Hcl 40 Mg Caps (Fluoxetine hcl) .... Take one by mouth daily 3)  Trazodone Hcl 50 Mg Tabs (Trazodone hcl) .... 2-3 at bedtime nightly to help sleep 4)  Amlodipine Besylate 5 Mg Tabs (Amlodipine besylate) .... Take 1 tablet by mouth once a day 5)  Vicodin 5-500 Mg Tabs (Hydrocodone-acetaminophen) .Marland Kitchen.. 1 four times daily as needed severe pain 6)  Prilosec 20 Mg Cpdr (Omeprazole) .... Take 1 tablet by mouth two times a day 7)  Fexofenadine Hcl 180 Mg Tabs (Fexofenadine hcl) .... Take 1 tablet by mouth once daily as needed 8)  Lorazepam 1 Mg Tabs (Lorazepam) .... Take 1 by mouth two times a day as needed nerves 9)  Zofran 4 Mg Tabs (Ondansetron hcl) .Marland Kitchen.. 1 by mouth every 8 hours as needed for nausea 10)  Venlafaxine Hcl 75 Mg Xr24h-cap (Venlafaxine hcl) .Marland Kitchen.. 1 cap daily for anxiety and depression  Other Orders: TLB-Lipid Panel (80061-LIPID) TLB-Hepatic/Liver Function Pnl (80076-HEPATIC) Venipuncture (16109)  Specimen Handling (16109) T-Hepatitis C Antibody (60454-09811)  Patient Instructions: 1)  Please schedule a follow-up appointment in 1 month.  Prescriptions: VENLAFAXINE HCL 75 MG XR24H-CAP (VENLAFAXINE HCL) 1 cap daily for anxiety and depression  #30 x 11   Entered and Authorized by:   Cindee Salt MD   Signed by:   Cindee Salt MD on 12/22/2010   Method used:   Electronically to        [facility] [address] [phone]* (retail)       [address]       [place], [place]  [address]       Ph: [phone]       Fax: [phone]   RxID:   [id number]    Orders Added: 1)  Est. Patient 40-64 years [99396] 2)  TLB-Lipid Panel [80061-LIPID] 3)  TLB-Hepatic/Liver Function Pnl [80076-HEPATIC] 4)  Venipuncture [36415] 5)   TLB-Renal Function Panel [80069-RENAL] 6)  TLB-CBC Platelet - w/Differential [85025-CBCD] 7)  TLB-TSH (Thyroid Stimulating Hormone) [84443-TSH] 8)  Specimen Handling [99000] 9)  T-Hepatitis C Antibody [03474-25956]    Current Allergies (reviewed today): ! * WELLBUTRIN XL REMERON (MIRTAZAPINE) LISINOPRIL (LISINOPRIL)

## 2011-01-11 ENCOUNTER — Telehealth: Payer: Self-pay | Admitting: Internal Medicine

## 2011-01-15 ENCOUNTER — Telehealth: Payer: Self-pay | Admitting: Internal Medicine

## 2011-01-19 NOTE — Progress Notes (Signed)
Summary: LORAZEPAM  Phone Note [facility]ill Request [phone] on January 11, 2011 4:03 PM  Refills Requested: Medication #1:  LORAZEPAM 1 MG TABS take 1 by mouth two times a day as needed nerves   Last Refilled: 12/14/2010 E-Scribe Request    Method Requested: Telephone to Pharmacy Initial call taken by: Mervin Hack CMA Duncan Dull),  January 11, 2011 4:03 PM  Follow-up for Phone Call        okay #60 x 0 Follow-up by: Cindee Salt MD,  January 11, 2011 5:15 PM  Additional Follow-up for Phone Call Additional follow up Details #1::        Rx called to pharmacy Additional Follow-up by: DeShannon Katrinka Blazing CMA Duncan Dull),  January 11, 2011 5:25 PM    Prescriptions: LORAZEPAM 1 MG TABS (LORAZEPAM) take 1 by mouth two times a day as needed nerves  #60 x 0   Entered by:   Mervin Hack CMA (AAMA)   Authorized by:   Cindee Salt MD   Signed by:   Mervin Hack CMA (AAMA) on 01/11/2011   Method used:   Telephoned to ...       [facility] [address] [phone]* (retail)       [address]       [place], [place]  [address]       Ph: [phone]       Fax: [phone]   RxID:   [id number]

## 2011-01-19 NOTE — Progress Notes (Signed)
Summary: VICODIN  Phone Note [facility]ill Request [facility]  [phone] on January 15, 2011 3:26 PM  Refills Requested: Medication #1:  VICODIN 5-500 MG TABS 1 four times daily as needed severe pain   Last Refilled: 12/14/2010 E-Scribe Request    Method Requested: Telephone to Pharmacy Initial call taken by: Mervin Hack CMA Duncan Dull),  January 15, 2011 3:26 PM  Follow-up for Phone Call        Okay #60 x 0 Follow-up by: Cindee Salt MD,  January 15, 2011 3:40 PM  Additional Follow-up for Phone Call Additional follow up Details #1::        Rx called to pharmacy Additional Follow-up by: DeShannon Smith CMA Duncan Dull),  January 15, 2011 4:08 PM    Prescriptions: VICODIN 5-500 MG TABS (HYDROCODONE-ACETAMINOPHEN) 1 four times daily as needed severe pain  #60 x 0   Entered by:   Mervin Hack CMA (AAMA)   Authorized by:   Cindee Salt MD   Signed by:   Mervin Hack CMA (AAMA) on 01/15/2011   Method used:   Telephoned to ...       [facility] [address] [phone]* (retail)       [address]       [place], [place]  [address]       Ph: [phone]       Fax: [phone]   RxID:   [id number]

## 2011-01-19 NOTE — Progress Notes (Signed)
Summary: refill request for trazodone  Phone Note Refill Request Message from:  Fax from Pharmacy  Refills Requested: Medication #1:  TRAZODONE HCL 50 MG TABS 2-3 at bedtime nightly to help sleep   Last Refilled: 10/30/2010 Faxed form from [facility] [place] is on your desk.  Phone [phone].  Initial call taken by: Lowella Petties CMA, AAMA,  January 15, 2011 4:01 PM  Follow-up for Phone Call        okay #90 x 1 year Follow-up by: Cindee Salt MD,  January 15, 2011 4:47 PM  Additional Follow-up for Phone Call Additional follow up Details #1::        Rx faxed to pharmacy Additional Follow-up by: DeShannon Smith CMA Duncan Dull),  January 15, 2011 4:49 PM    Prescriptions: TRAZODONE HCL 50 MG TABS (TRAZODONE HCL) 2-3 at bedtime nightly to help sleep  #90 Each x 11   Entered by:   Mervin Hack CMA (AAMA)   Authorized by:   Cindee Salt MD   Signed by:   Mervin Hack CMA (AAMA) on 01/15/2011   Method used:   Electronically to        [facility] [address] [phone]* (retail)       [address]       [place], [place]  [address]       Ph: [phone]       Fax: [phone]   RxID:   [id number]

## 2011-01-22 ENCOUNTER — Ambulatory Visit (INDEPENDENT_AMBULATORY_CARE_PROVIDER_SITE_OTHER): Payer: BC Managed Care – PPO | Admitting: Internal Medicine

## 2011-01-22 ENCOUNTER — Encounter: Payer: Self-pay | Admitting: Internal Medicine

## 2011-01-22 DIAGNOSIS — G250 Essential tremor: Secondary | ICD-10-CM

## 2011-01-22 DIAGNOSIS — I10 Essential (primary) hypertension: Secondary | ICD-10-CM

## 2011-01-22 DIAGNOSIS — F329 Major depressive disorder, single episode, unspecified: Secondary | ICD-10-CM

## 2011-01-28 NOTE — Assessment & Plan Note (Signed)
Summary: ONE MTH FOLLOW UP / LFW   Vital Signs:  Patient profile:   45 year old female Weight:      137 pounds Temp:     99.0 degrees F oral Pulse rate:   76 / minute Pulse rhythm:   regular BP sitting:   135 / 92  (left arm) Cuff size:   regular  Vitals Entered By: Mervin Hack CMA Duncan Dull) (January 22, 2011 11:22 AM) CC: follow-up   History of Present Illness: Did have some increased sweating for a week after starting the venlafaxine did stop and she continued  Seems to have helped her mood a little Husband has been stable now Still has depressed feelings on daily basis--but not as bad  sleeping better--using the trazodone  appetite is still not good  has ongoing shaking Tremor is familial in mom and brother  Allergies: 1)  ! * Wellbutrin Xl 2)  Remeron (Mirtazapine) 3)  Lisinopril (Lisinopril)  Past History:  Past medical, surgical, family and social histories (including risk factors) reviewed for relevance to current acute and chronic problems.  Past Medical History: Allergic rhinitis Depression GERD Hypertension Migraines Hyperlipidemia-mild Essential tremor  Past Surgical History: Reviewed history from 06/19/2008 and no changes required. Stress echo, sl. MVP 06/04 EGD 05/05/2006 5/09 Lap chole  Family History: Mom and Dad with HTN Dad had MI in his 62's Dad has DM 2 brothers--1 with HTN No breast or colon cancer Lung cancer in pat GM Essential tremor in Mom and brother  Social History: Reviewed history from 04/03/2007 and no changes required. 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  NAD Neurologic:  mild tremor resting and with intention Normal tone No bradykinesia gait is normal Psych:  normally interactive, good eye  contact, not anxious appearing, and depressed affect.     Impression & Recommendations:  Problem # 1:  TREMOR, ESSENTIAL (ICD-333.1) Assessment New will switch BP med to propranolol LA  Problem # 2:  DEPRESSION (ICD-311) Assessment: Improved better but still daily depression will increase the venlafaxine (after set on new BP med)  The following medications were removed from the medication list:    Venlafaxine Hcl 75 Mg Xr24h-cap (Venlafaxine hcl) .Marland Kitchen... 1 cap daily for anxiety and depression Her updated medication list for this problem includes:    Fluoxetine Hcl 40 Mg Caps (Fluoxetine hcl) .Marland Kitchen... Take one by mouth daily    Trazodone Hcl 50 Mg Tabs (Trazodone hcl) .Marland Kitchen... 2-3 at bedtime nightly to help sleep    Lorazepam 1 Mg Tabs (Lorazepam) .Marland Kitchen... Take 1 by mouth two times a day as needed nerves    Venlafaxine Hcl 150 Mg Xr24h-cap (Venlafaxine hcl) .Marland Kitchen... 1 capsule daily for depression  Problem # 3:  HYPERTENSION (ICD-401.9) Assessment: Unchanged will switch for tremor  The following medications were removed from the medication list:    Amlodipine Besylate 5 Mg Tabs (Amlodipine besylate) .Marland Kitchen... Take 1 tablet by mouth once a day Her updated medication list for this problem includes:    Propranolol Hcl Cr 60 Mg Xr24h-cap (Propranolol hcl) .Marland Kitchen... 1 tab daily for high blood pressure and tremor  Complete Medication List: 1)  Lo/ovral (28) 0.3-30 Mg-mcg Tabs (Norgestrel-ethinyl estradiol) .... Take one by mouth daily 2)  Fluoxetine Hcl 40 Mg Caps (Fluoxetine  hcl) .... Take one by mouth daily 3)  Trazodone Hcl 50 Mg Tabs (Trazodone hcl) .... 2-3 at bedtime nightly to help sleep 4)  Vicodin 5-500 Mg Tabs (Hydrocodone-acetaminophen) .Marland Kitchen.. 1 four times daily as needed severe pain 5)  Prilosec 20 Mg Cpdr (Omeprazole) .... Take 1 tablet by mouth two times a day 6)  Fexofenadine Hcl 180 Mg Tabs (Fexofenadine hcl) .... Take 1 tablet by mouth once daily as needed 7)  Lorazepam 1 Mg Tabs (Lorazepam)  .... Take 1 by mouth two times a day as needed nerves 8)  Zofran 4 Mg Tabs (Ondansetron hcl) .Marland Kitchen.. 1 by mouth every 8 hours as needed for nausea 9)  Propranolol Hcl Cr 60 Mg Xr24h-cap (Propranolol hcl) .Marland Kitchen.. 1 tab daily for high blood pressure and tremor 10)  Venlafaxine Hcl 150 Mg Xr24h-cap (Venlafaxine hcl) .Marland Kitchen.. 1 capsule daily for depression  Patient Instructions: 1)  Please stop the amlodipine and start the propranolol. Wait until you are stable on the new dose of the venlafaxine 2)  Please schedule a follow-up appointment in 2 months.  Prescriptions: VENLAFAXINE HCL 150 MG XR24H-CAP (VENLAFAXINE HCL) 1 capsule daily for depression  #30 x 5   Entered and Authorized by:   Cindee Salt MD   Signed by:   Cindee Salt MD on 01/22/2011   Method used:   Electronically to        [facility] [address] [phone]* (retail)       [address]       [place], [place]  [address]       Ph: [phone]       Fax: [phone]   RxID:   [id number] PROPRANOLOL HCL CR 60 MG XR24H-CAP (PROPRANOLOL HCL) 1 tab daily for high blood pressure and tremor  #30 x 11   Entered and Authorized by:   Cindee Salt MD   Signed by:   Cindee Salt MD on 01/22/2011   Method used:   Electronically to        [facility] [address] [phone]* (retail)       [address]       [place], [place]  [address]       Ph: [phone]       Fax: [phone]   RxID:   [id number]    Orders Added: 1)  Est. Patient Level IV [57322]    Current Allergies (reviewed today): ! * WELLBUTRIN XL REMERON (MIRTAZAPINE) LISINOPRIL (LISINOPRIL)

## 2011-02-01 ENCOUNTER — Encounter: Payer: Self-pay | Admitting: Internal Medicine

## 2011-02-01 DIAGNOSIS — G250 Essential tremor: Secondary | ICD-10-CM

## 2011-02-01 DIAGNOSIS — E785 Hyperlipidemia, unspecified: Secondary | ICD-10-CM | POA: Insufficient documentation

## 2011-02-01 DIAGNOSIS — F32A Depression, unspecified: Secondary | ICD-10-CM | POA: Insufficient documentation

## 2011-02-01 DIAGNOSIS — I10 Essential (primary) hypertension: Secondary | ICD-10-CM | POA: Insufficient documentation

## 2011-02-01 DIAGNOSIS — G43909 Migraine, unspecified, not intractable, without status migrainosus: Secondary | ICD-10-CM | POA: Insufficient documentation

## 2011-02-01 DIAGNOSIS — K219 Gastro-esophageal reflux disease without esophagitis: Secondary | ICD-10-CM | POA: Insufficient documentation

## 2011-02-01 DIAGNOSIS — F329 Major depressive disorder, single episode, unspecified: Secondary | ICD-10-CM

## 2011-02-01 DIAGNOSIS — J309 Allergic rhinitis, unspecified: Secondary | ICD-10-CM | POA: Insufficient documentation

## 2011-02-10 ENCOUNTER — Other Ambulatory Visit: Payer: Self-pay | Admitting: Internal Medicine

## 2011-02-11 ENCOUNTER — Other Ambulatory Visit: Payer: Self-pay | Admitting: *Deleted

## 2011-02-11 MED ORDER — OMEPRAZOLE 20 MG PO CPDR: 20.0000 mg | DELAYED_RELEASE_CAPSULE | Freq: Two times a day (BID) | ORAL | Status: DC

## 2011-02-11 NOTE — Telephone Encounter (Signed)
rx called into pharmacy

## 2011-02-11 NOTE — Telephone Encounter (Signed)
Okay #60 x 0 

## 2011-02-14 ENCOUNTER — Other Ambulatory Visit: Payer: Self-pay | Admitting: Internal Medicine

## 2011-03-22 ENCOUNTER — Other Ambulatory Visit: Payer: Self-pay | Admitting: Internal Medicine

## 2011-03-22 NOTE — Telephone Encounter (Signed)
Okay #60 x 0 

## 2011-03-22 NOTE — Telephone Encounter (Signed)
rx called into pharmacy

## 2011-03-30 ENCOUNTER — Ambulatory Visit: Payer: BC Managed Care – PPO | Admitting: Internal Medicine

## 2011-04-11 ENCOUNTER — Other Ambulatory Visit: Payer: Self-pay | Admitting: Internal Medicine

## 2011-04-12 NOTE — Telephone Encounter (Signed)
Okay #60 x 0 

## 2011-04-22 ENCOUNTER — Other Ambulatory Visit: Payer: Self-pay | Admitting: Internal Medicine

## 2011-04-22 NOTE — Telephone Encounter (Signed)
Okay #60 x 0 

## 2011-04-22 NOTE — Telephone Encounter (Signed)
Received electronic request from [facility] [place] for refill on Hydrocodone-APAP 5-500mg . Appears last refill given 03/22/11.

## 2011-04-22 NOTE — Telephone Encounter (Signed)
rx called into pharmacy

## 2011-04-23 ENCOUNTER — Ambulatory Visit: Payer: BC Managed Care – PPO | Admitting: Internal Medicine

## 2011-04-30 ENCOUNTER — Encounter: Payer: Self-pay | Admitting: Internal Medicine

## 2011-04-30 ENCOUNTER — Ambulatory Visit (INDEPENDENT_AMBULATORY_CARE_PROVIDER_SITE_OTHER): Payer: BC Managed Care – PPO | Admitting: Internal Medicine

## 2011-04-30 DIAGNOSIS — F329 Major depressive disorder, single episode, unspecified: Secondary | ICD-10-CM

## 2011-04-30 DIAGNOSIS — G250 Essential tremor: Secondary | ICD-10-CM

## 2011-04-30 DIAGNOSIS — G252 Other specified forms of tremor: Secondary | ICD-10-CM

## 2011-04-30 DIAGNOSIS — I10 Essential (primary) hypertension: Secondary | ICD-10-CM

## 2011-04-30 NOTE — Patient Instructions (Addendum)
If you continue to have the feeling of something getting stuck in your throat, please add an extra omeprazole (prilosec) at bedtime Please set up the referral to the psychologist

## 2011-04-30 NOTE — Assessment & Plan Note (Signed)
Not really better on the propranolol but tolerating so will continue

## 2011-04-30 NOTE — Assessment & Plan Note (Signed)
BP Readings from Last 3 Encounters:  04/30/11 110/70  01/22/11 135/92  12/22/10 146/90   She gets high numbers at home---will have her bring in her cuff

## 2011-04-30 NOTE — Progress Notes (Signed)
Subjective:    Patient ID: Heather Russo, female    DOB: 07-20-1966, 45 y.o.   MRN: [id number]  HPI DOing "about the same" Still having daily feelings of depression Thinks about suicide but no active ideation Remains functional Still not working for about 1 year--"that is about to drive me crazy"  Husband is doing about the same Taking chemo pill every day Having blood clots--on lovenox  Doesn't notice any improvement in tremor despite propranolol No major functional problems  Notes that her taste is off and gets "stuck feeling" in throat No heartburn if she takes the omeprazole  Does check BP at times Usually 140/110 No chest pain No SOB  Current Outpatient Prescriptions on File Prior to Visit  Medication Sig Dispense Refill  . CRYSELLE-28 0.3-30 MG-MCG per tablet TAKE ONE TABLET BY MOUTH EVERY DAY  28 each  11  . fexofenadine (ALLEGRA) 180 MG tablet Take 180 mg by mouth daily as needed.        Marland Kitchen FLUoxetine (PROZAC) 40 MG capsule Take 40 mg by mouth daily.        Marland Kitchen HYDROcodone-acetaminophen (VICODIN) 5-500 MG per tablet TAKE ONE TABLET BY MOUTH 4 TIMES DAILY AS NEEDED FOR SEVERE PAIN  60 tablet  0  . LORazepam (ATIVAN) 1 MG tablet TAKE ONE TABLET BY MOUTH TWICE DAILY AS NEEDED FOR  NERVES  60 tablet  0  . omeprazole (PRILOSEC) 20 MG capsule Take 1 capsule (20 mg total) by mouth 2 (two) times daily.  60 capsule  11  . ondansetron (ZOFRAN) 4 MG tablet Take 4 mg by mouth every 8 (eight) hours as needed. For nausea        . propranolol (INDERAL LA) 60 MG 24 hr capsule Take 60 mg by mouth daily. For high BP and tremor       . traZODone (DESYREL) 50 MG tablet Take 100-150 mg by mouth at bedtime. To help sleep       . venlafaxine (EFFEXOR-XR) 150 MG 24 hr capsule Take 150 mg by mouth daily. For depression         Past Medical History  Diagnosis Date  . Allergic rhinitis   . Depression   . GERD (gastroesophageal reflux disease)   . HTN (hypertension)   . Migraine   . HLD  (hyperlipidemia)     mild  . Tremor, essential     Past Surgical History  Procedure Date  . Esophagogastroduodenoscopy 05/05/06  . Laparoscopic cholecystectomy 03/2008    Family History  Problem Relation Age of Onset  . Hypertension Father   . Heart attack Father   . Diabetes Father   . Hypertension Mother   . Hypertension Brother   . Hypertension Brother   . Cancer Paternal Grandmother     Lung  . Tremor Mother   . Tremor Brother     History   Social History  . Marital Status: Married    Spouse Name: N/A    Number of Children: 1  . 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 Smoking status: Former Smoker    Quit date: 11/22/1985  . Smokeless tobacco: Not on file  . Alcohol Use: No  . Drug Use: Not on file  . Sexually Active: Not on file   Other Topics Concern  . Not on file   Social History Narrative  . No narrative on file   Review of  Systems Still not sleeping well unless she takes 3 trazodone Appetite isn't great Weight is stable     Objective:   Physical Exam  Constitutional: She appears well-developed and well-nourished. No distress.  Neck: Normal range of motion. Neck supple. No thyromegaly present.  Cardiovascular: Regular rhythm, normal heart sounds and intact distal pulses.  Exam reveals no gallop.   No murmur heard. Pulmonary/Chest: Effort normal and breath sounds normal. No respiratory distress. She has no wheezes. She has no rales.  Musculoskeletal: Normal range of motion. She exhibits no edema and no tenderness.  Lymphadenopathy:    She has no cervical adenopathy.  Neurological:       Still with mild resting tremor at rest---no accentuation with intentional movements  Psychiatric: Her behavior is normal. Judgment and thought content normal.       Mildly depressed mood with appropriate affect          Assessment & Plan:

## 2011-04-30 NOTE — Assessment & Plan Note (Signed)
Better than at her worst but daily symptoms May be medication resistant with her unemployment and husband's cancer Will make referral for psychology

## 2011-05-13 ENCOUNTER — Other Ambulatory Visit: Payer: Self-pay | Admitting: Internal Medicine

## 2011-05-13 NOTE — Telephone Encounter (Signed)
Okay #60 x 0 

## 2011-05-14 NOTE — Telephone Encounter (Signed)
rx called into pharmacy

## 2011-05-27 ENCOUNTER — Other Ambulatory Visit: Payer: Self-pay | Admitting: Internal Medicine

## 2011-05-27 NOTE — Telephone Encounter (Signed)
Okay #60 x 0 

## 2011-05-28 NOTE — Telephone Encounter (Signed)
rx called into pharmacy

## 2011-06-08 ENCOUNTER — Ambulatory Visit: Payer: BC Managed Care – PPO | Admitting: Psychology

## 2011-06-08 ENCOUNTER — Other Ambulatory Visit: Payer: Self-pay | Admitting: Internal Medicine

## 2011-06-16 ENCOUNTER — Other Ambulatory Visit: Payer: Self-pay | Admitting: Internal Medicine

## 2011-06-16 NOTE — Telephone Encounter (Signed)
DR.LETVAK patient, ok to refill?

## 2011-06-16 NOTE — Telephone Encounter (Signed)
Ok to refill 

## 2011-06-16 NOTE — Telephone Encounter (Signed)
rx called into pharmacy

## 2011-07-01 ENCOUNTER — Other Ambulatory Visit: Payer: Self-pay | Admitting: Internal Medicine

## 2011-07-01 NOTE — Telephone Encounter (Signed)
rx called into pharmacy

## 2011-07-01 NOTE — Telephone Encounter (Signed)
Okay #60 x 0 

## 2011-07-19 ENCOUNTER — Ambulatory Visit: Payer: BC Managed Care – PPO | Admitting: Internal Medicine

## 2011-07-20 ENCOUNTER — Other Ambulatory Visit: Payer: Self-pay | Admitting: *Deleted

## 2011-07-20 MED ORDER — LORAZEPAM 1 MG PO TABS: 1.0000 mg | ORAL_TABLET | Freq: Two times a day (BID) | ORAL | Status: DC | PRN

## 2011-07-20 NOTE — Telephone Encounter (Signed)
Okay to refill? 

## 2011-07-20 NOTE — Telephone Encounter (Signed)
rx called into pharmacy

## 2011-07-20 NOTE — Telephone Encounter (Signed)
Okay #60 x 0 

## 2011-07-29 ENCOUNTER — Other Ambulatory Visit: Payer: Self-pay | Admitting: Internal Medicine

## 2011-07-30 NOTE — Telephone Encounter (Signed)
Rx called to [facility].

## 2011-07-30 NOTE — Telephone Encounter (Signed)
Okay #60 x 0 

## 2011-08-02 ENCOUNTER — Ambulatory Visit (INDEPENDENT_AMBULATORY_CARE_PROVIDER_SITE_OTHER): Payer: BC Managed Care – PPO | Admitting: Family Medicine

## 2011-08-02 ENCOUNTER — Encounter: Payer: Self-pay | Admitting: Family Medicine

## 2011-08-02 ENCOUNTER — Telehealth: Payer: Self-pay | Admitting: *Deleted

## 2011-08-02 DIAGNOSIS — F438 Other reactions to severe stress: Secondary | ICD-10-CM

## 2011-08-02 DIAGNOSIS — R059 Cough, unspecified: Secondary | ICD-10-CM

## 2011-08-02 DIAGNOSIS — R05 Cough: Secondary | ICD-10-CM

## 2011-08-02 DIAGNOSIS — K219 Gastro-esophageal reflux disease without esophagitis: Secondary | ICD-10-CM

## 2011-08-02 MED ORDER — AZITHROMYCIN 250 MG PO TABS: ORAL_TABLET | ORAL | Status: AC

## 2011-08-02 MED ORDER — ESOMEPRAZOLE MAGNESIUM 40 MG PO PACK: 1.0000 mg | PACK | Freq: Every day | ORAL | Status: DC

## 2011-08-02 MED ORDER — HYDROCOD POLST-CHLORPHEN POLST 10-8 MG/5ML PO LQCR: 5.0000 mL | Freq: Every evening | ORAL | Status: DC | PRN

## 2011-08-02 MED ORDER — ESOMEPRAZOLE MAGNESIUM 40 MG PO CPDR: 40.0000 mg | DELAYED_RELEASE_CAPSULE | Freq: Every day | ORAL | Status: DC

## 2011-08-02 NOTE — Telephone Encounter (Signed)
Pharmacy needs clarification on Nexium 40 mg packet. Sent in and said to take 1 mg prior to breakfast. Should this be 1 packet prior to breakfast?

## 2011-08-02 NOTE — Patient Instructions (Addendum)
I think you hve bronchitis. Take Zpack as directed. Start mucinex with plenty of fluids to help mobilize mucous. Get plenty of rest over next several days. Call us if not improving as expected or any worsening cough, breathing, or fever >101.5. tussionex for cough at night. For reflux - try stronger medicine called nexium (I've sent to pharmacy).  If not helping, please return for further evaluation.

## 2011-08-02 NOTE — Assessment & Plan Note (Signed)
Increase PPI from prilosec 20mg  to nexium 40mg .  Provided with samples. If not improved with this, return for further evaluation, may need EGD if dysphagia.

## 2011-08-02 NOTE — Progress Notes (Signed)
  Subjective:    Patient ID: Heather Russo, female    DOB: 1966/09/23, 45 y.o.   MRN: [id number]  HPI CC: cough  1 wk h/o deep cough, started as coughing and sneezing.  rattly cough but not producing sputum.  + subjective fever/chill when started.  Has taken nyquil which helps some.  + HA, described as tension in neck and back of head.  Mild nausea.    No abd pain, v/d, rashes, RN, itchy eyes.  H/o GERD, when takes omeprazole 20mg  bid doesn't really help anymore.  Worsening recently.  Sometimes feels pressure in chest when swallowing solid and liquid food.  Has been on prilosec 20mg  for several years now.  Pt doesn't smoke.  Husband in hospital right now with liver cancer, still smoking.  Stress with this.  No one else sick around her.  No h/o asthma,copd.  Review of Systems Per HPI    Objective:   Physical Exam  Nursing note and vitals reviewed. Constitutional: She appears well-developed and well-nourished. No distress.       Mild tremor at baseline, coughing  HENT:  Head: Normocephalic and atraumatic.  Right Ear: External ear normal.  Left Ear: External ear normal.  Nose: Nose normal.  Mouth/Throat: Oropharynx is clear and moist. No oropharyngeal exudate.  Eyes: Conjunctivae and EOM are normal. Pupils are equal, round, and reactive to light. No scleral icterus.  Neck: Normal range of motion. Neck supple.  Cardiovascular: Normal rate, regular rhythm, normal heart sounds and intact distal pulses.   No murmur heard. Pulmonary/Chest: Effort normal and breath sounds normal. No respiratory distress. She has no wheezes. She has no rales.       Crackles bibasilarly, slightly ronchorous mid lung fields  Lymphadenopathy:    She has no cervical adenopathy.  Skin: Skin is warm and dry. No rash noted.  Psychiatric: She has a normal mood and affect.          Assessment & Plan:

## 2011-08-02 NOTE — Telephone Encounter (Signed)
Have changed.

## 2011-08-02 NOTE — Assessment & Plan Note (Signed)
Bronchitis, treat with zpack. Update if not improving as expected. tussionex for cough at night.

## 2011-08-15 ENCOUNTER — Other Ambulatory Visit: Payer: Self-pay | Admitting: Internal Medicine

## 2011-08-30 ENCOUNTER — Ambulatory Visit: Payer: BC Managed Care – PPO | Admitting: Internal Medicine

## 2011-09-03 ENCOUNTER — Other Ambulatory Visit: Payer: Self-pay | Admitting: Internal Medicine

## 2011-09-03 NOTE — Telephone Encounter (Signed)
Please phone in

## 2011-09-03 NOTE — Telephone Encounter (Signed)
rx called into pharmacy

## 2011-09-07 ENCOUNTER — Ambulatory Visit: Payer: BC Managed Care – PPO | Admitting: Internal Medicine

## 2011-09-07 DIAGNOSIS — Z0289 Encounter for other administrative examinations: Secondary | ICD-10-CM

## 2011-09-10 ENCOUNTER — Other Ambulatory Visit: Payer: Self-pay | Admitting: Internal Medicine

## 2011-09-10 NOTE — Telephone Encounter (Signed)
[facility] states the last time they filled it was sept 6, 12

## 2011-09-10 NOTE — Telephone Encounter (Signed)
Please advise 

## 2011-09-10 NOTE — Telephone Encounter (Signed)
Okay #60 x 0 then

## 2011-09-10 NOTE — Telephone Encounter (Signed)
Medication phoned to pharmacy.  

## 2011-09-10 NOTE — Telephone Encounter (Signed)
Please check with pharmacy It looks like this was just filled 10/12

## 2011-10-18 ENCOUNTER — Telehealth: Payer: Self-pay | Admitting: *Deleted

## 2011-10-18 ENCOUNTER — Other Ambulatory Visit: Payer: Self-pay | Admitting: Internal Medicine

## 2011-10-18 NOTE — Telephone Encounter (Signed)
rx called into pharmacy

## 2011-10-18 NOTE — Telephone Encounter (Signed)
Patient called upset stating that she needs the directions on her Lorazepam increased so that she can take it more than 2 times a day. Please advise.

## 2011-10-18 NOTE — Telephone Encounter (Signed)
I have concerns about her using too much and really do not want her to take it even 2 times per day (though I have filled for that quantity)  We will have to discuss this before I will give more than #60 per month

## 2011-10-18 NOTE — Telephone Encounter (Signed)
Okay #60 x 0 

## 2011-10-18 NOTE — Telephone Encounter (Signed)
Spoke with patient and advised results   

## 2011-11-17 ENCOUNTER — Other Ambulatory Visit: Payer: Self-pay | Admitting: Internal Medicine

## 2011-11-17 NOTE — Telephone Encounter (Signed)
Okay #60 x 0 She needs to reschedule her follow up that was missed

## 2011-11-17 NOTE — Telephone Encounter (Signed)
rx called into pharmacy Spoke with patient and she will call and schedule appt soon.

## 2011-11-26 ENCOUNTER — Other Ambulatory Visit: Payer: Self-pay | Admitting: Family Medicine

## 2011-11-26 NOTE — Telephone Encounter (Signed)
plz phone in. 

## 2011-11-29 NOTE — Telephone Encounter (Signed)
rx called into pharmacy

## 2011-12-29 ENCOUNTER — Telehealth: Payer: Self-pay | Admitting: Internal Medicine

## 2011-12-29 ENCOUNTER — Ambulatory Visit (INDEPENDENT_AMBULATORY_CARE_PROVIDER_SITE_OTHER): Payer: Self-pay | Admitting: Internal Medicine

## 2011-12-29 ENCOUNTER — Encounter: Payer: Self-pay | Admitting: Internal Medicine

## 2011-12-29 DIAGNOSIS — R42 Dizziness and giddiness: Secondary | ICD-10-CM

## 2011-12-29 DIAGNOSIS — F329 Major depressive disorder, single episode, unspecified: Secondary | ICD-10-CM

## 2011-12-29 MED ORDER — FLUOXETINE HCL 20 MG PO CAPS: 20.0000 mg | ORAL_CAPSULE | Freq: Every day | ORAL | Status: DC

## 2011-12-29 NOTE — Assessment & Plan Note (Signed)
Worse now with still acute grieving reaction Ongoing mood issues that are obviously worse off the venlafaxine Will try fluoxetine for cost reasons

## 2011-12-29 NOTE — Patient Instructions (Signed)
Please get information on our reduced payment program If you are jittery or nervous with the fluoxetine, try taking it every other day for the first 2 weeks

## 2011-12-29 NOTE — Telephone Encounter (Signed)
Triage Record Num: [id number] Operator: Hillary Bow Patient Name: Heather Russo Call Date & Time: 12/28/2011 4:12:10PM Patient Phone: [phone] PCP: Tillman Abide Patient Gender: Female PCP Fax : [phone] Patient DOB: 1965-12-09 Practice Name: [facility] Reason for Call: [name]: [name]/Patient; PCP: Tillman Abide I.; CB#: [phone]; ; ; Call regarding Patient Has Been Having Trouble With Light Headedness and Took Blood Pressure and Its 145/107, onset 2-5. Pt has nausea and light headedness while smoking. Pt lost her husband in October 2012. Pt taking HTN meds and Ativan PRN. All emergent sxs r/o per Hypertension Protocol. Advised Pt to keep daily record of BP and f/u w/ Office for appt w/n 3 days. Discussed smoking sensation. Home care advice given. Protocol(s) Used: Hypertension, Diagnosed or Suspected Recommended Outcome per Protocol: See Provider within 72 Hours Reason for Outcome: Multiple elevated blood pressure readings without other symptoms AND no previous work-up OR readings exceed expected range defined by treatment plan Care Advice: ~ Call provider if systolic BP is 180 or greater, or if diastolic BP is 120 or greater. 12/28/2011 4:20:09PM Page 1 of 1 [email or web address]

## 2011-12-29 NOTE — Assessment & Plan Note (Signed)
Occurring only when smokes cigarette Needs to stop Can't afford meds for the most part Gave info on 1-800 QUIT NOW

## 2011-12-29 NOTE — Telephone Encounter (Signed)
Please call to offer her an appt within the next 1-2 days

## 2011-12-29 NOTE — Progress Notes (Signed)
Subjective:    Patient ID: Heather Russo, female    DOB: [birth year], 46 y.o.   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 home with husband Restarted smoking while caring for him---quit 24 years ago with pregnancy  Has had dizziness---happening with each cigarette Yesterday, got nauseated and broke out in sweat Smoking ~10 a day  Taking BP since call yesterday 132/89, 141/97, 136/91  Repeated today at 133/90 then went up to 141/100 with dizziness and cigarette  Having freq BMs---gets urgency when upset and has to run to bathroom Trying to avoid spicy foods without help No blood in stool No previous IBS type problems  No chest pain Occ sense of heart racing No SOB Occ cough  Feels depressed and sad about every day Some hopeless feelings No suicidal thoughts Using lorazepam prn---when very upset. Needs it often at work. Mostly 1 a day   Current Outpatient Prescriptions on File Prior to Visit  Medication Sig Dispense Refill  . CRYSELLE-28 0.3-30 MG-MCG per tablet TAKE ONE TABLET BY MOUTH EVERY DAY  28 each  11  . fexofenadine (ALLEGRA) 180 MG tablet Take 180 mg by mouth daily as needed.        Marland Kitchen HYDROcodone-acetaminophen (VICODIN) 5-500 MG per tablet TAKE ONE TABLET BY MOUTH 4 TIMES DAILY AS NEEDED FOR SEVERE PAIN  60 tablet  0  . LORazepam (ATIVAN) 1 MG tablet Take 1 tablet (1 mg total) by mouth 2 (two) times daily as needed for anxiety.  60 tablet  0  . omeprazole (PRILOSEC) 20 MG capsule Take 1 capsule (20 mg total) by mouth 2 (two) times daily.  60 capsule  11  . propranolol (INDERAL LA) 60 MG 24 hr capsule Take 60 mg by mouth daily. For high BP and tremor       . traZODone (DESYREL) 50 MG tablet Take 100-150 mg by mouth at bedtime. To help sleep       . venlafaxine (EFFEXOR-XR) 150 MG 24 hr capsule TAKE ONE CAPSULE BY MOUTH EVERY DAY FOR DEPRESSION  30 capsule  6    Allergies  Allergen Reactions  .  Bupropion Hcl     REACTION: rash, nausea, bad taste in mouth, blurred vision  . Lisinopril     REACTION: cough,dizziness  . Mirtazapine     REACTION: unspecified    Past Medical History  Diagnosis Date  . Allergic rhinitis   . Depression   . GERD (gastroesophageal reflux disease)   . HTN (hypertension)   . Migraine   . HLD (hyperlipidemia)     mild  . Tremor, essential     Past Surgical History  Procedure Date  . Esophagogastroduodenoscopy 05/05/06  . Laparoscopic cholecystectomy 03/2008    Family History  Problem Relation Age of Onset  . Hypertension Father   . Heart attack Father   . Diabetes Father   . Hypertension Mother   . Hypertension Brother   . Hypertension Brother   . Cancer Paternal Grandmother     Lung  . Tremor Mother   . Tremor Brother     History   Social History  . Marital Status: Widowed    Spouse Name: N/A    Number of Children: 1  . Years of Education: N/A   Occupational History  . Archivist  .     Social History Main Topics  . Smoking status: Current  Everyday Smoker    Last Attempt to Quit: 11/22/1985  . Smokeless tobacco: Never Used  . Alcohol Use: No  . Drug Use: No  . Sexually Active: Not on file   Other Topics Concern  . Not on file   Social History Narrative   Widowed 10/12   Review of Systems Not sleeping well Appetite so-so     Objective:   Physical Exam  Constitutional: She appears well-developed and well-nourished.  Neck: Normal range of motion. Neck supple.  Cardiovascular: Normal rate, regular rhythm and normal heart sounds.  Exam reveals no gallop.   No murmur heard. Pulmonary/Chest: Effort normal. No respiratory distress. She has no wheezes. She has no rales.  Abdominal: Soft. There is no tenderness.  Lymphadenopathy:    She has no cervical adenopathy.  Psychiatric:       Tearful and depressed Affect is appropriate to mood No thought process disturbances          Assessment &  Plan:

## 2012-01-20 ENCOUNTER — Other Ambulatory Visit: Payer: Self-pay | Admitting: Internal Medicine

## 2012-01-20 NOTE — Telephone Encounter (Signed)
rx called into pharmacy

## 2012-01-20 NOTE — Telephone Encounter (Signed)
Okay #60 x 0 

## 2012-01-27 ENCOUNTER — Other Ambulatory Visit: Payer: Self-pay | Admitting: Internal Medicine

## 2012-01-28 ENCOUNTER — Ambulatory Visit: Payer: Self-pay | Admitting: Internal Medicine

## 2012-02-02 ENCOUNTER — Encounter: Payer: Self-pay | Admitting: Family Medicine

## 2012-02-02 ENCOUNTER — Ambulatory Visit (INDEPENDENT_AMBULATORY_CARE_PROVIDER_SITE_OTHER): Payer: Self-pay | Admitting: Family Medicine

## 2012-02-02 VITALS — BP 140/90 | HR 60 | Temp 98.1°F | Wt 146.0 lb

## 2012-02-02 DIAGNOSIS — F438 Other reactions to severe stress: Secondary | ICD-10-CM

## 2012-02-02 DIAGNOSIS — F329 Major depressive disorder, single episode, unspecified: Secondary | ICD-10-CM

## 2012-02-02 DIAGNOSIS — H9202 Otalgia, left ear: Secondary | ICD-10-CM

## 2012-02-02 DIAGNOSIS — H9209 Otalgia, unspecified ear: Secondary | ICD-10-CM

## 2012-02-02 MED ORDER — FLUOXETINE HCL 40 MG PO CAPS: 40.0000 mg | ORAL_CAPSULE | Freq: Every day | ORAL | Status: DC

## 2012-02-02 NOTE — Progress Notes (Deleted)
  Subjective:    Patient ID: Heather Russo, female    DOB: 12/28/1965, 46 y.o.   MRN: [id number]  HPI    Review of Systems  Patient Active Problem List  Diagnoses  . HYPERLIPIDEMIA  . ANXIETY, SITUATIONAL  . DEPRESSION  . COMMON MIGRAINE  . HYPERTENSION  . ALLERGIC RHINITIS  . GERD  . ACTINIC KERATOSIS  . TREMOR, ESSENTIAL  . Cough  . Dizziness   Past Medical History  Diagnosis Date  . Allergic rhinitis   . Depression   . GERD (gastroesophageal reflux disease)   . HTN (hypertension)   . Migraine   . HLD (hyperlipidemia)     mild  . Tremor, essential    Past Surgical History  Procedure Date  . Esophagogastroduodenoscopy 05/05/06  . Laparoscopic cholecystectomy 03/2008   History  Substance Use Topics  . Smoking status: Current Everyday Smoker    Last Attempt to Quit: 11/22/1985  . Smokeless tobacco: Never Used  . Alcohol Use: No   Family History  Problem Relation Age of Onset  . Hypertension Father   . Heart attack Father   . Diabetes Father   . Hypertension Mother   . Hypertension Brother   . Hypertension Brother   . Cancer Paternal Grandmother     Lung  . Tremor Mother   . Tremor Brother    Allergies  Allergen Reactions  . Bupropion Hcl     REACTION: rash, nausea, bad taste in mouth, blurred vision  . Lisinopril     REACTION: cough,dizziness  . Mirtazapine     REACTION: unspecified   Current Outpatient Prescriptions on File Prior to Visit  Medication Sig Dispense Refill  . CRYSELLE-28 0.3-30 MG-MCG per tablet TAKE ONE TABLET BY MOUTH EVERY DAY  28 each  11  . fexofenadine (ALLEGRA) 180 MG tablet Take 180 mg by mouth daily as needed.        Marland Kitchen FLUoxetine (PROZAC) 20 MG capsule Take 1 capsule (20 mg total) by mouth daily.  30 capsule  11  . HYDROcodone-acetaminophen (VICODIN) 5-500 MG per tablet TAKE ONE TABLET BY MOUTH 4 TIMES DAILY AS NEEDED FOR SEVERE PAIN  60 tablet  0  . LORazepam (ATIVAN) 1 MG tablet TAKE ONE TABLET BY MOUTH TWICE DAILY AS  NEEDED  60 tablet  0  . omeprazole (PRILOSEC) 20 MG capsule Take 1 capsule (20 mg total) by mouth 2 (two) times daily.  60 capsule  11  . propranolol (INDERAL LA) 60 MG 24 hr capsule TAKE ONE  BY MOUTH EVERY DAY FOR  HIGH  BLOOD  PRESSURE  AND  TREMOR  30 capsule  11        Objective:   Physical Exam  BP 140/90  Pulse 60  Temp(Src) 98.1 F (36.7 C) (Oral)  Wt 146 lb (66.225 kg)       Assessment & Plan:

## 2012-02-02 NOTE — Progress Notes (Signed)
Subjective:    Patient ID: Heather Russo, female    DOB: 12-14-1965, 46 y.o.   MRN: [id number]  HPI  46 yo pt of Dr. Alphonsus Sias here for left ear/jaw pain x 1 week.  Saw Dr. Alphonsus Sias last month for increased anxiety and depression. 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  Still feels depressed and sad. No suicidal thoughts. Not sure if she is grinding her teeth but says it is possible since she is "very stressed."  Using lorazepam prn- now up to using it twice daily.   Current Outpatient Prescriptions on File Prior to Visit  Medication Sig Dispense Refill  . CRYSELLE-28 0.3-30 MG-MCG per tablet TAKE ONE TABLET BY MOUTH EVERY DAY  28 each  11  . fexofenadine (ALLEGRA) 180 MG tablet Take 180 mg by mouth daily as needed.        Marland Kitchen HYDROcodone-acetaminophen (VICODIN) 5-500 MG per tablet TAKE ONE TABLET BY MOUTH 4 TIMES DAILY AS NEEDED FOR SEVERE PAIN  60 tablet  0  . LORazepam (ATIVAN) 1 MG tablet TAKE ONE TABLET BY MOUTH TWICE DAILY AS NEEDED  60 tablet  0  . omeprazole (PRILOSEC) 20 MG capsule Take 1 capsule (20 mg total) by mouth 2 (two) times daily.  60 capsule  11  . propranolol (INDERAL LA) 60 MG 24 hr capsule TAKE ONE  BY MOUTH EVERY DAY FOR  HIGH  BLOOD  PRESSURE  AND  TREMOR  30 capsule  11    Allergies  Allergen Reactions  . Bupropion Hcl     REACTION: rash, nausea, bad taste in mouth, blurred vision  . Lisinopril     REACTION: cough,dizziness  . Mirtazapine     REACTION: unspecified    Past Medical History  Diagnosis Date  . Allergic rhinitis   . Depression   . GERD (gastroesophageal reflux disease)   . HTN (hypertension)   . Migraine   . HLD (hyperlipidemia)     mild  . Tremor, essential     Past Surgical History  Procedure Date  . Esophagogastroduodenoscopy 05/05/06  . Laparoscopic cholecystectomy 03/2008    Family History    Problem Relation Age of Onset  . Hypertension Father   . Heart attack Father   . Diabetes Father   . Hypertension Mother   . Hypertension Brother   . Hypertension Brother   . Cancer Paternal Grandmother     Lung  . Tremor Mother   . Tremor Brother     History   Social History  . Marital Status: Widowed    Spouse Name: N/A    Number of Children: 1  . Years of Education: N/A   Occupational History  . Archivist  .     Social History Main Topics  . Smoking status: Current Everyday Smoker    Last Attempt to Quit: 11/22/1985  . Smokeless tobacco: Never Used  . Alcohol Use: No  . Drug Use: No  . Sexually Active: Not on file   Other Topics Concern  . Not on file   Social History Narrative   Widowed 10/12   Review of Systems Not sleeping well Appetite so-so     Objective:   Physical Exam  BP 140/90  Pulse 60  Temp(Src) 98.1 F (36.7 C) (Oral)  Wt 146 lb (66.225 kg)  Constitutional: She appears well-developed and well-nourished.  HEENT:  TMs clear bilaterally, canals appear normal. Neck: Normal range of motion. Neck supple.  Cardiovascular: Normal rate, regular rhythm and normal heart sounds.  Exam reveals no gallop.   No murmur heard. Pulmonary/Chest: Effort normal. No respiratory distress. She has no wheezes. She has no rales.  Abdominal: Soft. There is no tenderness.  Lymphadenopathy:    She has no cervical adenopathy.  Psychiatric:       Tearful but appropriate   Assessment and Plan:  1. Ear pain- NO abnormalities on exam. ?TMJ secondary to increased stressors. Discussed referral for further work up. See below.  2.  Adjustment disorder- Will increase dose of Prozac to 40 mg daily. Pt to call me in 2 weeks with an update and follow up with Dr. Alphonsus Sias as scheduled next month.      Assessment & Plan:

## 2012-02-02 NOTE — Patient Instructions (Signed)
Good to see you. We have increased your Prozac to 40 mg daily. If you're not feeling better in a couple of weeks, please let me know. Keep your April appointment with Dr. Alphonsus Sias.

## 2012-02-07 ENCOUNTER — Other Ambulatory Visit: Payer: Self-pay | Admitting: Internal Medicine

## 2012-02-07 NOTE — Telephone Encounter (Signed)
Okay #60 x 0 

## 2012-02-07 NOTE — Telephone Encounter (Signed)
Rx called to [facility] pharmacy, patient notified via telephone.

## 2012-02-24 ENCOUNTER — Other Ambulatory Visit: Payer: Self-pay | Admitting: Internal Medicine

## 2012-02-28 ENCOUNTER — Encounter: Payer: Self-pay | Admitting: Internal Medicine

## 2012-02-28 ENCOUNTER — Ambulatory Visit (INDEPENDENT_AMBULATORY_CARE_PROVIDER_SITE_OTHER): Payer: Self-pay | Admitting: Internal Medicine

## 2012-02-28 VITALS — BP 112/72 | HR 83 | Temp 98.0°F | Ht 65.0 in | Wt 146.0 lb

## 2012-02-28 DIAGNOSIS — F329 Major depressive disorder, single episode, unspecified: Secondary | ICD-10-CM

## 2012-02-28 NOTE — Assessment & Plan Note (Signed)
Doing better now She thinks it is from the meds Discussed bereavement counselor if needed Change fluoxetine to morning--may help somewhat restless sleep

## 2012-02-28 NOTE — Patient Instructions (Addendum)
Please try the fluoxetine If you have more trouble with your grieving--please call to see bereavement counselor at [facility]

## 2012-02-28 NOTE — Progress Notes (Signed)
  Subjective:    Patient ID: Heather Russo, female    DOB: [birth year], 46 y.o.   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 CRYSELLE-28 0.3-30 MG-MCG tablet TAKE ONE TABLET BY MOUTH EVERY DAY  28 each  11  . fexofenadine (ALLEGRA) 180 MG tablet Take 180 mg by mouth daily as needed.        Marland Kitchen FLUoxetine (PROZAC) 40 MG capsule Take 1 capsule (40 mg total) by mouth daily.  90 capsule  3  . HYDROcodone-acetaminophen (VICODIN) 5-500 MG per tablet TAKE ONE TABLET BY MOUTH 4 TIMES DAILY FOR SEVERE PAIN  60 tablet  0  . LORazepam (ATIVAN) 1 MG tablet TAKE ONE TABLET BY MOUTH TWICE DAILY AS NEEDED  60 tablet  0  . omeprazole (PRILOSEC) 20 MG capsule Take 1 capsule (20 mg total) by mouth 2 (two) times daily.  60 capsule  11  . propranolol (INDERAL LA) 60 MG 24 hr capsule TAKE ONE  BY MOUTH EVERY DAY FOR  HIGH  BLOOD  PRESSURE  AND  TREMOR  30 capsule  11    Allergies  Allergen Reactions  . Bupropion Hcl     REACTION: rash, nausea, bad taste in mouth, blurred vision  . Lisinopril     REACTION: cough,dizziness  . Mirtazapine     REACTION: unspecified    Past Medical History  Diagnosis Date  . Allergic rhinitis   . Depression   . GERD (gastroesophageal reflux disease)   . HTN (hypertension)   . Migraine   . HLD (hyperlipidemia)     mild  . Tremor, essential     Past Surgical History  Procedure Date  . Esophagogastroduodenoscopy 05/05/06  . Laparoscopic cholecystectomy 03/2008    Family History  Problem Relation  Age of Onset  . Hypertension Father   . Heart attack Father   . Diabetes Father   . Hypertension Mother   . Hypertension Brother   . Hypertension Brother   . Cancer Paternal Grandmother     Lung  . Tremor Mother   . Tremor Brother     History   Social History  . Marital Status: Widowed    Spouse Name: N/A    Number of Children: 1  . Years of Education: N/A   Occupational History  . Archivist  .     Social History Main Topics  . Smoking status: Current Everyday Smoker    Last Attempt to Quit: 11/22/1985  . Smokeless tobacco: Never Used  . Alcohol Use: No  . Drug Use: No  . Sexually Active: Not on file   Other Topics Concern  . Not on file   Social History Narrative   Widowed 10/12   Review of Systems Appetite is good Weight is stable    Objective:   Physical Exam        Assessment & Plan:

## 2012-03-09 ENCOUNTER — Other Ambulatory Visit: Payer: Self-pay | Admitting: Internal Medicine

## 2012-03-10 NOTE — Telephone Encounter (Signed)
rx called into pharmacy

## 2012-03-10 NOTE — Telephone Encounter (Signed)
Heather Russo 

## 2012-03-10 NOTE — Telephone Encounter (Signed)
plz phone in. 

## 2012-03-23 ENCOUNTER — Other Ambulatory Visit: Payer: Self-pay | Admitting: Internal Medicine

## 2012-03-25 NOTE — Telephone Encounter (Signed)
Okay #60 x 0 

## 2012-03-27 NOTE — Telephone Encounter (Signed)
rx called into pharmacy

## 2012-04-10 ENCOUNTER — Other Ambulatory Visit: Payer: Self-pay | Admitting: Family Medicine

## 2012-04-10 NOTE — Telephone Encounter (Signed)
rx called into pharmacy

## 2012-04-10 NOTE — Telephone Encounter (Signed)
Okay #60 x 0 

## 2012-04-10 NOTE — Telephone Encounter (Signed)
route to PCP

## 2012-05-01 ENCOUNTER — Ambulatory Visit (INDEPENDENT_AMBULATORY_CARE_PROVIDER_SITE_OTHER): Payer: Self-pay | Admitting: Internal Medicine

## 2012-05-01 ENCOUNTER — Encounter: Payer: Self-pay | Admitting: Internal Medicine

## 2012-05-01 VITALS — BP 122/80 | HR 70 | Temp 98.5°F | Ht 65.0 in | Wt 148.0 lb

## 2012-05-01 DIAGNOSIS — F329 Major depressive disorder, single episode, unspecified: Secondary | ICD-10-CM

## 2012-05-01 DIAGNOSIS — G43009 Migraine without aura, not intractable, without status migrainosus: Secondary | ICD-10-CM

## 2012-05-01 DIAGNOSIS — G250 Essential tremor: Secondary | ICD-10-CM

## 2012-05-01 NOTE — Assessment & Plan Note (Signed)
Satisfied with the propranolol

## 2012-05-01 NOTE — Assessment & Plan Note (Signed)
Some improvement Time and steady work have helped Will continue current med

## 2012-05-01 NOTE — Assessment & Plan Note (Signed)
Still uses the vicodin regularly when has period---less often other times

## 2012-05-01 NOTE — Progress Notes (Signed)
Subjective:    Patient ID: Heather Russo, female    DOB: 05-31-1966, 46 y.o.   MRN: [id number]  HPI Doing a little better Sadness and depressed thoughts are not daily Doesn't think she needs bereavement counselor  Happy to have a real job Still no benefits though  Sleep has improved Appetite is fine  Uses vicodin prn for bad headaches (often worse with stress) or right hip pain Occ awakens from the hip pain Probably no more than 3 per week May have perimenstrual migraines also and use more in that week  Current Outpatient Prescriptions on File Prior to Visit  Medication Sig Dispense Refill  . CRYSELLE-28 0.3-30 MG-MCG tablet TAKE ONE TABLET BY MOUTH EVERY DAY  28 each  11  . fexofenadine (ALLEGRA) 180 MG tablet Take 180 mg by mouth daily as needed.        Marland Kitchen FLUoxetine (PROZAC) 40 MG capsule Take 1 capsule (40 mg total) by mouth daily.  90 capsule  3  . HYDROcodone-acetaminophen (VICODIN) 5-500 MG per tablet TAKE ONE TABLET BY MOUTH 4 TIMES DAILY FOR SEVERE PAIN  60 tablet  0  . LORazepam (ATIVAN) 1 MG tablet Take 1 tablet (1 mg total) by mouth 2 (two) times daily as needed.  60 tablet  0  . omeprazole (PRILOSEC) 20 MG capsule Take 1 capsule (20 mg total) by mouth 2 (two) times daily.  60 capsule  11  . propranolol (INDERAL LA) 60 MG 24 hr capsule TAKE ONE  BY MOUTH EVERY DAY FOR  HIGH  BLOOD  PRESSURE  AND  TREMOR  30 capsule  11    Allergies  Allergen Reactions  . Bupropion Hcl     REACTION: rash, nausea, bad taste in mouth, blurred vision  . Lisinopril     REACTION: cough,dizziness  . Mirtazapine     REACTION: unspecified    Past Medical History  Diagnosis Date  . Allergic rhinitis   . Depression   . GERD (gastroesophageal reflux disease)   . HTN (hypertension)   . Migraine   . HLD (hyperlipidemia)     mild  . Tremor, essential     Past Surgical History  Procedure Date  . Esophagogastroduodenoscopy 05/05/06  . Laparoscopic cholecystectomy 03/2008    Family  History  Problem Relation Age of Onset  . Hypertension Father   . Heart attack Father   . Diabetes Father   . Hypertension Mother   . Hypertension Brother   . Hypertension Brother   . Cancer Paternal Grandmother     Lung  . Tremor Mother   . Tremor Brother     History   Social History  . Marital Status: Widowed    Spouse Name: N/A    Number of Children: 1  . Years of Education: N/A   Occupational History  .  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 Smoking status: Current Everyday Smoker    Last Attempt to Quit: 11/22/1985  . Smokeless tobacco: Never Used  . Alcohol Use: No  . Drug Use: No  . Sexually Active: Not on file   Other Topics Concern  . Not on file   Social History Narrative   Widowed 10/12   Review of Systems No SOB No stomach trouble    Objective:   Physical Exam  Constitutional: She appears well-developed and well-nourished. No distress.  Psychiatric: She has a normal mood and affect. Her behavior is  normal. Thought content normal.          Assessment & Plan:

## 2012-05-26 ENCOUNTER — Other Ambulatory Visit: Payer: Self-pay | Admitting: Internal Medicine

## 2012-05-26 NOTE — Telephone Encounter (Signed)
Please call in

## 2012-05-26 NOTE — Telephone Encounter (Signed)
Heather Russo 

## 2012-05-26 NOTE — Telephone Encounter (Signed)
rx called into pharmacy

## 2012-06-29 ENCOUNTER — Other Ambulatory Visit: Payer: Self-pay | Admitting: *Deleted

## 2012-06-29 MED ORDER — HYDROCODONE-ACETAMINOPHEN 5-500 MG PO TABS: ORAL_TABLET | ORAL | Status: DC

## 2012-06-29 NOTE — Telephone Encounter (Signed)
rx called into pharmacy LM for pt that rx was called in

## 2012-06-29 NOTE — Telephone Encounter (Signed)
Okay #60 x 0 

## 2012-06-29 NOTE — Telephone Encounter (Signed)
Pt called to request Hydrocodone refilled today; pt said she requested from [facility] [place] 3 days ago. I advised it appears we received from [facility] today.

## 2012-06-29 NOTE — Telephone Encounter (Signed)
Faxed refill request from [facility] [place], last filled 05/26/12.

## 2012-06-30 ENCOUNTER — Other Ambulatory Visit: Payer: Self-pay | Admitting: *Deleted

## 2012-06-30 NOTE — Telephone Encounter (Signed)
disregard

## 2012-07-28 ENCOUNTER — Other Ambulatory Visit: Payer: Self-pay | Admitting: Internal Medicine

## 2012-07-28 NOTE — Telephone Encounter (Signed)
rx called into pharmacy

## 2012-07-28 NOTE — Telephone Encounter (Signed)
Okay #60 x 0 

## 2012-08-16 ENCOUNTER — Telehealth: Payer: Self-pay | Admitting: Internal Medicine

## 2012-08-16 NOTE — Telephone Encounter (Signed)
Noted  

## 2012-08-16 NOTE — Telephone Encounter (Signed)
[name]: [name]/Patient; Patient Name: Heather Russo; PCP: [facility]); Best Callback Phone Number: [phone]; LMP: 08/11/12. 08/16/12 - Chest discomfort for 2 weeks now. Slight shortness of breath  & heart "flutters" every once in awhile.  Afebrile. Emergent Sign/Symptom of "pressure, fullness, squeezing sensation or pain anywhere in the chest lasting 5 or more minutes now or within the last hour" per Chest Pain Protocol (disposition - Call 911). Patient refused to call 911 or go to the Emergency Room. Insisted on scheduling an appointment in the office for tomorrow morning (stated that she was unable to get to the office this afternoon due to distance). Appointment scheduled for 08/17/12 @ 9:00 am with Dr. Ermalene Searing. Urged to call 911 or go to the ER if symptoms become worse.

## 2012-08-16 NOTE — Telephone Encounter (Signed)
Will await the evaluation tomorrow 

## 2012-08-17 ENCOUNTER — Encounter: Payer: Self-pay | Admitting: Family Medicine

## 2012-08-17 ENCOUNTER — Ambulatory Visit: Payer: Self-pay | Admitting: Family Medicine

## 2012-08-17 ENCOUNTER — Ambulatory Visit (INDEPENDENT_AMBULATORY_CARE_PROVIDER_SITE_OTHER): Payer: Self-pay | Admitting: Family Medicine

## 2012-08-17 VITALS — BP 124/90 | HR 75 | Temp 98.5°F | Wt 154.0 lb

## 2012-08-17 DIAGNOSIS — R0789 Other chest pain: Secondary | ICD-10-CM

## 2012-08-17 DIAGNOSIS — R5383 Other fatigue: Secondary | ICD-10-CM

## 2012-08-17 DIAGNOSIS — R5381 Other malaise: Secondary | ICD-10-CM

## 2012-08-17 LAB — CBC WITH DIFFERENTIAL/PLATELET
Basophils Absolute: 0 10*3/uL (ref 0.0–0.1)
Basophils Relative: 0.3 % (ref 0.0–3.0)
Eosinophils Absolute: 0.1 10*3/uL (ref 0.0–0.7)
Eosinophils Relative: 0.9 % (ref 0.0–5.0)
HCT: 45.2 % (ref 36.0–46.0)
Hemoglobin: 14.9 g/dL (ref 12.0–15.0)
Lymphocytes Relative: 26.9 % (ref 12.0–46.0)
Lymphs Abs: 3.7 10*3/uL (ref 0.7–4.0)
MCHC: 32.9 g/dL (ref 30.0–36.0)
MCV: 93.3 fl (ref 78.0–100.0)
Monocytes Absolute: 0.6 10*3/uL (ref 0.1–1.0)
Monocytes Relative: 4.2 % (ref 3.0–12.0)
Neutro Abs: 9.2 10*3/uL — ABNORMAL HIGH (ref 1.4–7.7)
Neutrophils Relative %: 67.7 % (ref 43.0–77.0)
Platelets: 361 10*3/uL (ref 150.0–400.0)
RBC: 4.85 Mil/uL (ref 3.87–5.11)
RDW: 13 % (ref 11.5–14.6)
WBC: 13.6 10*3/uL — ABNORMAL HIGH (ref 4.5–10.5)

## 2012-08-17 LAB — TSH: TSH: 1.13 u[IU]/mL (ref 0.35–5.50)

## 2012-08-17 MED ORDER — FLUOXETINE HCL 40 MG PO CAPS: 80.0000 mg | ORAL_CAPSULE | Freq: Every day | ORAL | Status: DC

## 2012-08-17 NOTE — Progress Notes (Signed)
Chest tightness noted recently.  Substernal.  Lasts 15-30 minutes.  Comes on when she gets upset.  Not clearly related to exertion, as she can walk w/o sx.  No syncope.  Occly mildly SOB with the episodes.  Episodes tend to resolve with calming down, trying to relax. No syncope.  No BLE edema.   No personal h/o CAD or CVA.   She'll get upset stomach frequently, esp with AM in diarrhea, after first meal of the day.  Going on for several months.    "I just don't feel good."  Fatigued, esp over the last few weeks.  Has fallen asleep during the morning, but not later in the day.  All of this was noted in last ~4 months.  Sleeping well at night, if she takes lorazepam at night.  No SI/HI  Lives with a friend.  Safe at home.  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   Heartburn- on PPI BID with some relief.   Smoking 1/2 PPD, started back after her husband died. No wheeze.  No sputum but episodes of dry cough noted. No drinking, no illicits.    Meds, vitals, and allergies reviewed.   ROS: See HPI.  Otherwise, noncontributory.  GEN: nad, alert and oriented, tearful but regains composure HEENT: mucous membranes moist NECK: supple w/o LA CV: rrr.  no murmur PULM: ctab, no inc wob ABD: soft, +bs EXT: no edema SKIN: no acute rash  EKG wnl and unchanged from prev.

## 2012-08-17 NOTE — Patient Instructions (Addendum)
Go to the lab on the way out.  We'll contact you with your lab report. Take 2 of the prozac capsules a day.  See if that helps.   If you aren't improved, then notify me or Dr. Alphonsus Sias.   Take care.

## 2012-08-18 ENCOUNTER — Encounter: Payer: Self-pay | Admitting: *Deleted

## 2012-08-18 ENCOUNTER — Other Ambulatory Visit: Payer: Self-pay | Admitting: Internal Medicine

## 2012-08-18 DIAGNOSIS — R0789 Other chest pain: Secondary | ICD-10-CM | POA: Insufficient documentation

## 2012-08-18 NOTE — Telephone Encounter (Signed)
Okay #60 x 0 

## 2012-08-18 NOTE — Telephone Encounter (Signed)
rx called into pharmacy

## 2012-08-18 NOTE — Assessment & Plan Note (Signed)
No exertional sx; this is likely related to anxiety.  Would check cbc and tsh given the fatigue, but exam is o/w unremarkable for cardiac or pulm source.  Safe at home, would inc SSRI and then have her notify PCP or me if not improved.  Will notify PCP as a FYI.  Okay for outpatient f/u.  She agrees with plan.

## 2012-08-23 ENCOUNTER — Telehealth: Payer: Self-pay

## 2012-08-23 NOTE — Telephone Encounter (Signed)
Pt request 08/17/12 lab results; advised pt as instructed from result note. Pt understood and will wait for lab copy already mailed.

## 2012-09-20 ENCOUNTER — Other Ambulatory Visit: Payer: Self-pay | Admitting: Internal Medicine

## 2012-09-20 NOTE — Telephone Encounter (Signed)
Okay #60 x 0 for each one

## 2012-09-20 NOTE — Telephone Encounter (Signed)
rx called into pharmacy

## 2012-09-28 ENCOUNTER — Other Ambulatory Visit: Payer: Self-pay | Admitting: Internal Medicine

## 2012-09-28 NOTE — Telephone Encounter (Signed)
Spoke with patient and she got vicodin and not the lorazepam. [facility]oke with [facility] pharmacist and it was a error on their end they will fill now. Spoke with patient and advised results

## 2012-09-28 NOTE — Telephone Encounter (Signed)
Just refilled 10/31  please check to see what happened

## 2012-10-23 ENCOUNTER — Other Ambulatory Visit: Payer: Self-pay | Admitting: Internal Medicine

## 2012-10-23 ENCOUNTER — Telehealth: Payer: Self-pay

## 2012-10-23 NOTE — Telephone Encounter (Signed)
Pt left v/m that [facility] told pt does not manufacture vicodin any more. Pt request different pain med called to [facility]. Pt request call back when med called in.Please advise.

## 2012-10-23 NOTE — Telephone Encounter (Signed)
Just change to hydrocodone/APAP 5/325 Same directions

## 2012-10-23 NOTE — Telephone Encounter (Signed)
rx called into pharmacy

## 2012-10-23 NOTE — Telephone Encounter (Signed)
Okay #60 x 0 

## 2012-10-24 MED ORDER — HYDROCODONE-ACETAMINOPHEN 5-325 MG PO TABS: 1.0000 | ORAL_TABLET | ORAL | Status: DC | PRN

## 2012-10-24 NOTE — Telephone Encounter (Signed)
rx called into pharmacy

## 2012-10-31 ENCOUNTER — Ambulatory Visit (INDEPENDENT_AMBULATORY_CARE_PROVIDER_SITE_OTHER): Payer: Self-pay | Admitting: Internal Medicine

## 2012-10-31 ENCOUNTER — Encounter: Payer: Self-pay | Admitting: Internal Medicine

## 2012-10-31 VITALS — BP 120/88 | HR 80 | Temp 98.3°F | Wt 157.0 lb

## 2012-10-31 DIAGNOSIS — F3289 Other specified depressive episodes: Secondary | ICD-10-CM

## 2012-10-31 DIAGNOSIS — G43009 Migraine without aura, not intractable, without status migrainosus: Secondary | ICD-10-CM

## 2012-10-31 DIAGNOSIS — F329 Major depressive disorder, single episode, unspecified: Secondary | ICD-10-CM

## 2012-10-31 DIAGNOSIS — I10 Essential (primary) hypertension: Secondary | ICD-10-CM

## 2012-10-31 DIAGNOSIS — K219 Gastro-esophageal reflux disease without esophagitis: Secondary | ICD-10-CM

## 2012-10-31 MED ORDER — PROPRANOLOL HCL ER 80 MG PO CP24: 80.0000 mg | ORAL_CAPSULE | Freq: Every day | ORAL | Status: DC

## 2012-10-31 NOTE — Assessment & Plan Note (Signed)
BP Readings from Last 3 Encounters:  10/31/12 120/88  08/17/12 124/90  05/01/12 122/80   Good control on the propranolol

## 2012-10-31 NOTE — Assessment & Plan Note (Signed)
Ongoing migraines Will try increasing the propranolol

## 2012-10-31 NOTE — Progress Notes (Signed)
Subjective:    Patient ID: Heather Russo, female    DOB: May 21, 1966, 46 y.o.   MRN: [id number]  HPI Still at [employer] No benefits though She is happy with the job  Still has good days and bad days Doesn't have consecutive bad days Not anhedonic Initiates sleep fine---trouble maintaining. Awakens then takes a while to get back to sleep Will have tired time around 11AM. Does okay at work if busy  stomach is better Still has some symptoms with heartburn and rectal urgency after eating Classic IBS pattern (pain gone after moving bowels) No blood in stool  Some migraines recently Uses the hydrocodone for these  Only mild tremor  No chest pain No SOB No regular exercise--just walks at work  Current Outpatient Prescriptions on File Prior to Visit  Medication Sig Dispense Refill  . CRYSELLE-28 0.3-30 MG-MCG tablet TAKE ONE TABLET BY MOUTH EVERY DAY  28 each  11  . fexofenadine (ALLEGRA) 180 MG tablet Take 180 mg by mouth daily as needed.        Marland Kitchen FLUoxetine (PROZAC) 40 MG capsule Take 2 capsules (80 mg total) by mouth daily.  180 capsule  1  . HYDROcodone-acetaminophen (NORCO/VICODIN) 5-325 MG per tablet Take 1 tablet by mouth every 4 (four) hours as needed.  60 tablet  0  . LORazepam (ATIVAN) 1 MG tablet TAKE ONE TABLET BY MOUTH TWICE DAILY AS NEEDED  60 tablet  0  . omeprazole (PRILOSEC) 20 MG capsule Take 1 capsule (20 mg total) by mouth 2 (two) times daily.  60 capsule  11  . propranolol (INDERAL LA) 60 MG 24 hr capsule TAKE ONE  BY MOUTH EVERY DAY FOR  HIGH  BLOOD  PRESSURE  AND  TREMOR  30 capsule  11    Allergies  Allergen Reactions  . Bupropion Hcl     REACTION: rash, nausea, bad taste in mouth, blurred vision  . Lisinopril     REACTION: cough,dizziness  . Mirtazapine     REACTION: unspecified    Past Medical History  Diagnosis Date  . Allergic rhinitis   . Depression   . GERD (gastroesophageal reflux disease)   . HTN (hypertension)   . Migraine   . HLD  (hyperlipidemia)     mild  . Tremor, essential     Past Surgical History  Procedure Date  . Esophagogastroduodenoscopy 05/05/06  . Laparoscopic cholecystectomy 03/2008    Family History  Problem Relation Age of Onset  . Hypertension Father   . Heart attack Father   . Diabetes Father   . Hypertension Mother   . Hypertension Brother   . Hypertension Brother   . Cancer Paternal Grandmother     Lung  . Tremor Mother   . Tremor Brother     History   Social History  . Marital Status: Widowed    Spouse Name: N/A    Number of Children: 1  . Years of Education: N/A   Occupational History  .  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 Smoking status: Current Every Day Smoker    Last Attempt to Quit: 11/22/1985  . Smokeless tobacco: Never Used  . Alcohol Use: No  . Drug Use: No  . Sexually Active: Not on file   Other Topics Concern  . Not on file   Social History Narrative   Widowed 10/12   Review of Systems Not ready to stop smoking  Objective:   Physical Exam  Constitutional: She appears well-developed and well-nourished. No distress.  Neck: Normal range of motion. Neck supple. No thyromegaly present.  Cardiovascular: Normal rate, regular rhythm and normal heart sounds.  Exam reveals no gallop.   No murmur heard. Pulmonary/Chest: Effort normal and breath sounds normal. No respiratory distress. She has no wheezes. She has no rales.  Abdominal: Soft. There is no tenderness.  Musculoskeletal: She exhibits no edema and no tenderness.  Lymphadenopathy:    She has no cervical adenopathy.  Psychiatric: She has a normal mood and affect. Her behavior is normal.          Assessment & Plan:

## 2012-10-31 NOTE — Assessment & Plan Note (Signed)
And IBS No sig change

## 2012-10-31 NOTE — Assessment & Plan Note (Signed)
Mood has been okay Continue the med

## 2012-11-30 ENCOUNTER — Other Ambulatory Visit: Payer: Self-pay | Admitting: Internal Medicine

## 2012-11-30 NOTE — Telephone Encounter (Signed)
rx called into pharmacy

## 2012-11-30 NOTE — Telephone Encounter (Signed)
Okay #60 x 0 of each

## 2012-12-07 ENCOUNTER — Ambulatory Visit (INDEPENDENT_AMBULATORY_CARE_PROVIDER_SITE_OTHER): Payer: Self-pay | Admitting: Internal Medicine

## 2012-12-07 ENCOUNTER — Encounter: Payer: Self-pay | Admitting: Internal Medicine

## 2012-12-07 VITALS — BP 128/80 | HR 78 | Temp 97.8°F | Wt 154.0 lb

## 2012-12-07 DIAGNOSIS — J209 Acute bronchitis, unspecified: Secondary | ICD-10-CM

## 2012-12-07 MED ORDER — HYDROCODONE-HOMATROPINE 5-1.5 MG/5ML PO SYRP: 5.0000 mL | ORAL_SOLUTION | Freq: Every evening | ORAL | Status: DC | PRN

## 2012-12-07 MED ORDER — DOXYCYCLINE HYCLATE 100 MG PO TABS: 100.0000 mg | ORAL_TABLET | Freq: Two times a day (BID) | ORAL | Status: DC

## 2012-12-07 NOTE — Assessment & Plan Note (Signed)
Seems to have secondary bacterial infection Discussed supportive care Recommended cigarette cessation--she isn't ready Doxy and cough syrup

## 2012-12-07 NOTE — Progress Notes (Signed)
Subjective:    Patient ID: Heather Russo, female    DOB: Jun 10, 1966, 47 y.o.   MRN: [id number]  HPI Got sick a week ago The cough won't stop---seems to be worse Some mucus but winds up swallowing SOB after coughing No fever---did have earlier Some chills, sweats and shakes  No sig head congestion Some nasal drainage--not much Sore throat from cough in the morning Some ear pain  Has tried nyquil, alka seltzer plus, delsym. Able to sleep with this  Current Outpatient Prescriptions on File Prior to Visit  Medication Sig Dispense Refill  . CRYSELLE-28 0.3-30 MG-MCG tablet TAKE ONE TABLET BY MOUTH EVERY DAY  28 each  11  . fexofenadine (ALLEGRA) 180 MG tablet Take 180 mg by mouth daily as needed.        Marland Kitchen FLUoxetine (PROZAC) 40 MG capsule Take 2 capsules (80 mg total) by mouth daily.  180 capsule  1  . HYDROcodone-acetaminophen (NORCO/VICODIN) 5-325 MG per tablet TAKE ONE TABLET BY MOUTH EVERY 4 HOURS AS NEEDED FOR PAIN  60 tablet  0  . LORazepam (ATIVAN) 1 MG tablet TAKE ONE TABLET BY MOUTH TWICE DAILY AS NEEDED  60 tablet  0  . omeprazole (PRILOSEC) 20 MG capsule Take 1 capsule (20 mg total) by mouth 2 (two) times daily.  60 capsule  11  . propranolol ER (INDERAL LA) 80 MG 24 hr capsule Take 1 capsule (80 mg total) by mouth daily.  30 capsule  11    Allergies  Allergen Reactions  . Bupropion Hcl     REACTION: rash, nausea, bad taste in mouth, blurred vision  . Lisinopril     REACTION: cough,dizziness  . Mirtazapine     REACTION: unspecified    Past Medical History  Diagnosis Date  . Allergic rhinitis   . Depression   . GERD (gastroesophageal reflux disease)   . HTN (hypertension)   . Migraine   . HLD (hyperlipidemia)     mild  . Tremor, essential     Past Surgical History  Procedure Date  . Esophagogastroduodenoscopy 05/05/06  . Laparoscopic cholecystectomy 03/2008    Family History  Problem Relation Age of Onset  . Hypertension Father   . Heart attack Father     . Diabetes Father   . Hypertension Mother   . Hypertension Brother   . Hypertension Brother   . Cancer Paternal Grandmother     Lung  . Tremor Mother   . Tremor Brother     History   Social History  . Marital Status: Widowed    Spouse Name: N/A    Number of Children: 1  . Years of Education: N/A   Occupational History  .  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 Smoking status: Current Every Day Smoker    Last Attempt to Quit: 11/22/1985  . Smokeless tobacco: Never Used  . Alcohol Use: No  . Drug Use: No  . Sexually Active: Not on file   Other Topics Concern  . Not on file   Social History Narrative   Widowed 10/12   Review of Systems No rash GI bug last week---seemed to resolve Eating okay now     Objective:   Physical Exam  Constitutional: She appears well-developed and well-nourished. No distress.  HENT:  Mouth/Throat: Oropharynx is clear and moist. No oropharyngeal exudate.       TMs normal No sinus tenderness Moderate nasal inflammation  Neck: Normal range of motion. Neck supple. No thyromegaly present.  Pulmonary/Chest: Effort normal and breath sounds normal. No respiratory distress. She has no wheezes. She has no rales.  Lymphadenopathy:    She has no cervical adenopathy.          Assessment & Plan:

## 2012-12-08 ENCOUNTER — Telehealth: Payer: Self-pay | Admitting: Family Medicine

## 2012-12-08 MED ORDER — AMOXICILLIN 500 MG PO CAPS: 500.0000 mg | ORAL_CAPSULE | Freq: Two times a day (BID) | ORAL | Status: DC

## 2012-12-08 NOTE — Telephone Encounter (Signed)
RX sent to [facility] [place].  Pt advised.

## 2012-12-08 NOTE — Telephone Encounter (Signed)
Tell her sorry It is supposed to be cheap generic but I think there have been some supply issues  Please send Rx for amoxicillin 500mg  #40 x 0 2 tabs bid

## 2012-12-08 NOTE — Telephone Encounter (Signed)
Pt was unable to afford doxycyline and is requesting a cheaper alternative to be called in to [facility], [place].

## 2012-12-28 ENCOUNTER — Other Ambulatory Visit: Payer: Self-pay | Admitting: Internal Medicine

## 2012-12-28 NOTE — Telephone Encounter (Signed)
Okay #60 x 0 

## 2012-12-28 NOTE — Telephone Encounter (Signed)
rx called into pharmacy

## 2013-01-31 ENCOUNTER — Other Ambulatory Visit: Payer: Self-pay

## 2013-01-31 MED ORDER — HYDROCODONE-ACETAMINOPHEN 5-325 MG PO TABS: ORAL_TABLET | ORAL | Status: DC

## 2013-01-31 MED ORDER — LORAZEPAM 1 MG PO TABS: ORAL_TABLET | ORAL | Status: DC

## 2013-01-31 NOTE — Telephone Encounter (Signed)
Please call in

## 2013-01-31 NOTE — Telephone Encounter (Signed)
Pt left v/m  requesting refill Lorazepam and Hydrocodone apap to [facility] in [place].Please advise.

## 2013-02-01 NOTE — Telephone Encounter (Signed)
Rx phoned to pharmacy.  

## 2013-02-22 ENCOUNTER — Other Ambulatory Visit: Payer: Self-pay

## 2013-02-22 MED ORDER — NORGESTREL-ETHINYL ESTRADIOL 0.3-30 MG-MCG PO TABS: ORAL_TABLET | ORAL | Status: DC

## 2013-02-22 NOTE — Telephone Encounter (Signed)
Okay to refill for a year Change the follow up in June to a physical so I can do a pap

## 2013-02-22 NOTE — Telephone Encounter (Signed)
Patient doesn't have insurance and will scheduled CPE in June

## 2013-02-22 NOTE — Telephone Encounter (Signed)
Ok to fill? Last pap 2010

## 2013-02-22 NOTE — Telephone Encounter (Signed)
Pt left v/m requesting refill BC pill to be sent to [facility]. Could not determine pt's last CPX.Please advise.

## 2013-03-08 ENCOUNTER — Other Ambulatory Visit: Payer: Self-pay | Admitting: Family Medicine

## 2013-03-08 NOTE — Telephone Encounter (Signed)
Dr. Duncan out of office, please advise  

## 2013-03-08 NOTE — Telephone Encounter (Signed)
Rx called in as directed.   

## 2013-03-08 NOTE — Telephone Encounter (Signed)
plz phone in. 

## 2013-03-16 ENCOUNTER — Other Ambulatory Visit: Payer: Self-pay

## 2013-03-16 MED ORDER — FLUOXETINE HCL 40 MG PO CAPS: 80.0000 mg | ORAL_CAPSULE | Freq: Every day | ORAL | Status: DC

## 2013-03-16 NOTE — Telephone Encounter (Signed)
Heather Russo left v/m requesting refill fluoxetine 40 mg. # 180 x 0. Pt has CPX scheduled on 05/01/13.

## 2013-04-06 ENCOUNTER — Other Ambulatory Visit: Payer: Self-pay | Admitting: Family Medicine

## 2013-04-06 NOTE — Telephone Encounter (Signed)
Called to [facility] 

## 2013-04-06 NOTE — Telephone Encounter (Signed)
Electronic refill request.  Please advise. 

## 2013-04-06 NOTE — Telephone Encounter (Signed)
plz phone in. 

## 2013-04-09 NOTE — Telephone Encounter (Signed)
rx called into pharmacy

## 2013-04-09 NOTE — Telephone Encounter (Signed)
Okay #60 x 0 

## 2013-04-30 ENCOUNTER — Encounter: Payer: Self-pay | Admitting: Radiology

## 2013-05-01 ENCOUNTER — Ambulatory Visit: Payer: Self-pay | Admitting: Internal Medicine

## 2013-05-01 ENCOUNTER — Encounter: Payer: Self-pay | Admitting: Internal Medicine

## 2013-05-11 ENCOUNTER — Other Ambulatory Visit: Payer: Self-pay | Admitting: Family Medicine

## 2013-05-11 NOTE — Telephone Encounter (Signed)
plz phone in. 

## 2013-05-11 NOTE — Telephone Encounter (Signed)
Ok to refill in Dr. Letvak's absence? 

## 2013-05-11 NOTE — Telephone Encounter (Signed)
Rx called in as directed.   

## 2013-06-12 ENCOUNTER — Encounter: Payer: Self-pay | Admitting: *Deleted

## 2013-06-13 ENCOUNTER — Ambulatory Visit (INDEPENDENT_AMBULATORY_CARE_PROVIDER_SITE_OTHER): Payer: Self-pay | Admitting: Internal Medicine

## 2013-06-13 ENCOUNTER — Encounter: Payer: Self-pay | Admitting: Internal Medicine

## 2013-06-13 VITALS — BP 120/80 | HR 84 | Temp 98.1°F | Wt 153.0 lb

## 2013-06-13 DIAGNOSIS — R1031 Right lower quadrant pain: Secondary | ICD-10-CM | POA: Insufficient documentation

## 2013-06-13 NOTE — Progress Notes (Signed)
Subjective:    Patient ID: Heather Russo, female    DOB: 02/24/1966, 47 y.o.   MRN: [id number]  HPI Having appetite is off somewhat.  4 days of RLQ pain Sharp, worse with movement Worse in late afternoon Overall doesn't feel well---tired, wants to sleep  Some post prandial nausea--so limiting amount No vomiting Bowels have been loose but regular--twice a day.No blood  No urinary urgency  No increased frequency or dysuria  LMP 6/28 Light but normal Due soon Some bloating  No sex during this period No preceding dysparuenia  Current Outpatient Prescriptions on File Prior to Visit  Medication Sig Dispense Refill  . fexofenadine (ALLEGRA) 180 MG tablet Take 180 mg by mouth daily as needed.        Marland Kitchen FLUoxetine (PROZAC) 40 MG capsule Take 2 capsules (80 mg total) by mouth daily.  180 capsule  0  . HYDROcodone-acetaminophen (NORCO/VICODIN) 5-325 MG per tablet TAKE 1 TABLET BY MOUTH EVERY 4 HOURS AS NEEDED FOR PAIN  60 tablet  0  . LORazepam (ATIVAN) 1 MG tablet TAKE 1 TABLET BY MOUTH TWICE DAILY AS NEEDED  60 tablet  0  . norgestrel-ethinyl estradiol (CRYSELLE-28) 0.3-30 MG-MCG tablet TAKE ONE TABLET BY MOUTH EVERY DAY  1 Package  11  . propranolol ER (INDERAL LA) 80 MG 24 hr capsule Take 1 capsule (80 mg total) by mouth daily.  30 capsule  11   No current facility-administered medications on file prior to visit.    Allergies  Allergen Reactions  . Bupropion Hcl     REACTION: rash, nausea, bad taste in mouth, blurred vision  . Lisinopril     REACTION: cough,dizziness  . Mirtazapine     REACTION: unspecified    Past Medical History  Diagnosis Date  . Allergic rhinitis   . Depression   . GERD (gastroesophageal reflux disease)   . HTN (hypertension)   . Migraine   . HLD (hyperlipidemia)     mild  . Tremor, essential     Past Surgical History  Procedure Laterality Date  . Esophagogastroduodenoscopy  05/05/06  . Laparoscopic cholecystectomy  03/2008    Family  History  Problem Relation Age of Onset  . Hypertension Father   . Heart attack Father   . Diabetes Father   . Hypertension Mother   . Hypertension Brother   . Hypertension Brother   . Cancer Paternal Grandmother     Lung  . Tremor Mother   . Tremor Brother     History   Social History  . Marital Status: Widowed    Spouse Name: N/A    Number of Children: 1  . Years of Education: N/A   Occupational History  .  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 Smoking status: Current Every Day Smoker    Last Attempt to Quit: 11/22/1985  . Smokeless tobacco: Never Used  . Alcohol Use: No  . Drug Use: No  . Sexually Active: Not on file   Other Topics Concern  . Not on file   Social History Narrative   Widowed 10/12   Review of Systems No weight loss Some cough--nothing new (?allergies) No SOB Recent ER visit from work---had chest pain. Work up negative (including CT to rule out PE)--she thinks it was stress    Objective:   Physical Exam  Constitutional: She appears well-developed. No distress.  Neck: Normal range of motion. Neck supple.  Pulmonary/Chest:  Effort normal and breath sounds normal. No respiratory distress. She has no wheezes. She has no rales.  Abdominal: Soft. Bowel sounds are normal. She exhibits no distension and no mass. There is tenderness. There is no rebound and no guarding.  Mild suprapubic and LLQ tenderness  Genitourinary:  Normal intoitus Cervix normal position. No CMT or uterine tenderness Slight tenderness in both adnexa  Lymphadenopathy:    She has no cervical adenopathy.          Assessment & Plan:

## 2013-06-13 NOTE — Assessment & Plan Note (Signed)
But more tender on left side Urinalysis negative On OCP so doubt pregnancy No cervical motion tenderness Doesn't seem GI  Likely ovarian--related to premenstrual period Will observe Further testing if period abnormal or pain not better after (trying to limit due to no insurance)

## 2013-06-13 NOTE — Patient Instructions (Addendum)
If you don't have a normal period, please do a home pregnancy test. Call if your pain isn't better after your period or if it gets worse at any time Please set up a physical when you get insurance

## 2013-06-28 ENCOUNTER — Other Ambulatory Visit: Payer: Self-pay | Admitting: Family Medicine

## 2013-06-28 NOTE — Telephone Encounter (Signed)
Ok to refill in Dr. Karle Starch absence? Last filled 05/11/13. Last OV 06/13/13.

## 2013-06-29 NOTE — Telephone Encounter (Signed)
plz phone in. 

## 2013-06-29 NOTE — Telephone Encounter (Signed)
rx called into pharmacy

## 2013-07-24 ENCOUNTER — Other Ambulatory Visit: Payer: Self-pay | Admitting: Internal Medicine

## 2013-07-31 ENCOUNTER — Other Ambulatory Visit: Payer: Self-pay | Admitting: Family Medicine

## 2013-07-31 NOTE — Telephone Encounter (Signed)
Ok to refill? Last filled 06/28/13.

## 2013-08-01 NOTE — Telephone Encounter (Signed)
Okay #60 x 0 

## 2013-08-01 NOTE — Telephone Encounter (Signed)
rx called into pharmacy

## 2013-08-13 ENCOUNTER — Ambulatory Visit (INDEPENDENT_AMBULATORY_CARE_PROVIDER_SITE_OTHER): Payer: Self-pay | Admitting: Internal Medicine

## 2013-08-13 ENCOUNTER — Telehealth: Payer: Self-pay

## 2013-08-13 ENCOUNTER — Encounter: Payer: Self-pay | Admitting: Internal Medicine

## 2013-08-13 VITALS — BP 150/98 | HR 90 | Temp 97.7°F | Wt 155.0 lb

## 2013-08-13 DIAGNOSIS — J209 Acute bronchitis, unspecified: Secondary | ICD-10-CM

## 2013-08-13 MED ORDER — HYDROCODONE-HOMATROPINE 5-1.5 MG/5ML PO SYRP: 5.0000 mL | ORAL_SOLUTION | Freq: Every evening | ORAL | Status: DC | PRN

## 2013-08-13 MED ORDER — AMOXICILLIN 500 MG PO TABS: 1000.0000 mg | ORAL_TABLET | Freq: Two times a day (BID) | ORAL | Status: DC

## 2013-08-13 NOTE — Progress Notes (Signed)
Subjective:    Patient ID: Heather Russo, female    DOB: 17-Jul-1966, 47 y.o.   MRN: [id number]  HPI Has had cold and cough for over a week Started with sore throat and congestion Tried nyquil Cough is now mostly dry---had been productive of phlegm No nasal drainage or obvious post nasal drip Some ear pain Some headache  Has just been lying around Some fever Gets SOB if she coughs a lot  Using delsym for cough--- seemed to dry it out  Current Outpatient Prescriptions on File Prior to Visit  Medication Sig Dispense Refill  . esomeprazole (NEXIUM) 20 MG capsule Take 20 mg by mouth daily before breakfast.      . fexofenadine (ALLEGRA) 180 MG tablet Take 180 mg by mouth daily as needed.        Marland Kitchen FLUoxetine (PROZAC) 40 MG capsule TAKE 2 CAPSULES BY MOUTH EVERY DAY  180 capsule  0  . HYDROcodone-acetaminophen (NORCO/VICODIN) 5-325 MG per tablet TAKE 1 TABLET BY MOUTH EVERY 4 HOURS AS NEEDED FOR PAIN  60 tablet  0  . LORazepam (ATIVAN) 1 MG tablet TAKE 1 TABLET BY MOUTH TWICE DAILY AS NEEDED  60 tablet  0  . norgestrel-ethinyl estradiol (CRYSELLE-28) 0.3-30 MG-MCG tablet TAKE ONE TABLET BY MOUTH EVERY DAY  1 Package  11  . propranolol ER (INDERAL LA) 80 MG 24 hr capsule Take 1 capsule (80 mg total) by mouth daily.  30 capsule  11   No current facility-administered medications on file prior to visit.    Allergies  Allergen Reactions  . Bupropion Hcl     REACTION: rash, nausea, bad taste in mouth, blurred vision  . Lisinopril     REACTION: cough,dizziness  . Mirtazapine     REACTION: unspecified    Past Medical History  Diagnosis Date  . Allergic rhinitis   . Depression   . GERD (gastroesophageal reflux disease)   . HTN (hypertension)   . Migraine   . HLD (hyperlipidemia)     mild  . Tremor, essential     Past Surgical History  Procedure Laterality Date  . Esophagogastroduodenoscopy  05/05/06  . Laparoscopic cholecystectomy  03/2008    Family History  Problem Relation  Age of Onset  . Hypertension Father   . Heart attack Father   . Diabetes Father   . Hypertension Mother   . Hypertension Brother   . Hypertension Brother   . Cancer Paternal Grandmother     Lung  . Tremor Mother   . Tremor Brother     History   Social History  . Marital Status: Widowed    Spouse Name: N/A    Number of Children: 1  . Years of Education: N/A   Occupational History  .  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 Smoking status: Current Every Day Smoker    Last Attempt to Quit: 11/22/1985  . Smokeless tobacco: Never Used  . Alcohol Use: No  . Drug Use: No  . Sexual Activity: Not on file   Other Topics Concern  . Not on file   Social History Narrative   Widowed 10/12   Review of Systems No rash No nausea, vomiting or diarrhea Appetite is off some    Objective:   Physical Exam  Constitutional: She appears well-developed and well-nourished. No distress.  HENT:  Mouth/Throat: Oropharynx is clear and moist. No oropharyngeal exudate.  No sinus tenderness Moderate nasal  inflammation TMs normal  Neck: Normal range of motion. Neck supple.   Non tender anterior cervical nodes  Pulmonary/Chest: Effort normal and breath sounds normal. No respiratory distress. She has no wheezes. She has no rales.  Lymphadenopathy:    She has cervical adenopathy.          Assessment & Plan:

## 2013-08-13 NOTE — Patient Instructions (Signed)
Please start the amoxicillin antibiotic if you worsen, or are not improving by later this week.

## 2013-08-13 NOTE — Telephone Encounter (Signed)
Cellie said [facility] told pt needed Hycodan rx faxed to pharmacy. Pt said she gave rx to pharmacist. Spoke with pharmacist at [facility] [place] rx was overlooked when pt was in pharmacy. Medication is now ready for pick up. Pt notified.

## 2013-08-13 NOTE — Assessment & Plan Note (Signed)
Discussed cigarette cessation ?secondary bacterial infection Will try cough syrup Amoxil if not improving soon

## 2013-09-04 ENCOUNTER — Other Ambulatory Visit: Payer: Self-pay

## 2013-09-04 NOTE — Telephone Encounter (Signed)
Pt request rx hydrocodone apap. Call when ready for pick up. Pt still has enough med to last until 09/05/13. Due to distance to office pt request to pick up more than one rx if possible.Please advise.

## 2013-09-05 MED ORDER — HYDROCODONE-ACETAMINOPHEN 5-325 MG PO TABS: ORAL_TABLET | ORAL | Status: DC

## 2013-09-05 NOTE — Telephone Encounter (Signed)
Spoke with patient and advised rx ready for pick-up and it will be at the front desk.  

## 2013-09-05 NOTE — Telephone Encounter (Signed)
I do not generally give more than 1 Rx for controlled substances. Sorry for the inconvenience

## 2013-10-05 ENCOUNTER — Other Ambulatory Visit: Payer: Self-pay | Admitting: Family Medicine

## 2013-10-05 MED ORDER — HYDROCODONE-ACETAMINOPHEN 5-325 MG PO TABS: ORAL_TABLET | ORAL | Status: DC

## 2013-10-05 NOTE — Telephone Encounter (Signed)
Pt requesting refill on Hydrocodone-acetaminophen.  Please call when ready for pick up and remind pt that she needs to bring ID.  Controlled substance contract signed 2014 but pt was not screened per Dr. Alphonsus Sias.

## 2013-10-05 NOTE — Telephone Encounter (Signed)
rx called into pharmacy

## 2013-11-05 ENCOUNTER — Other Ambulatory Visit: Payer: Self-pay

## 2013-11-05 NOTE — Telephone Encounter (Signed)
Pt left v/m requesting hydrocodone apap. Call when ready for pick up. Pt request to pick up today.

## 2013-11-06 ENCOUNTER — Encounter: Payer: Self-pay | Admitting: Internal Medicine

## 2013-11-06 MED ORDER — HYDROCODONE-ACETAMINOPHEN 5-325 MG PO TABS: ORAL_TABLET | ORAL | Status: DC

## 2013-11-06 NOTE — Telephone Encounter (Signed)
Spoke with patient and advised rx ready for pick-up and it will be at the front desk.  

## 2013-11-11 ENCOUNTER — Other Ambulatory Visit: Payer: Self-pay | Admitting: Internal Medicine

## 2013-11-12 ENCOUNTER — Encounter: Payer: Self-pay | Admitting: Internal Medicine

## 2013-12-14 ENCOUNTER — Ambulatory Visit (INDEPENDENT_AMBULATORY_CARE_PROVIDER_SITE_OTHER)
Admission: RE | Admit: 2013-12-14 | Discharge: 2013-12-14 | Disposition: A | Discharge status: Home / Self Care | Payer: BC Managed Care – PPO | Source: Ambulatory Visit | Attending: Internal Medicine | Admitting: Internal Medicine

## 2013-12-14 ENCOUNTER — Ambulatory Visit (INDEPENDENT_AMBULATORY_CARE_PROVIDER_SITE_OTHER): Payer: BC Managed Care – PPO | Admitting: Internal Medicine

## 2013-12-14 ENCOUNTER — Encounter: Payer: Self-pay | Admitting: Internal Medicine

## 2013-12-14 VITALS — BP 120/80 | HR 75 | Temp 98.0°F | Wt 156.0 lb

## 2013-12-14 DIAGNOSIS — M545 Low back pain, unspecified: Secondary | ICD-10-CM

## 2013-12-14 MED ORDER — HYDROCODONE-ACETAMINOPHEN 5-325 MG PO TABS: ORAL_TABLET | ORAL | Status: DC

## 2013-12-14 MED ORDER — LORAZEPAM 1 MG PO TABS: 0.5000 mg | ORAL_TABLET | Freq: Two times a day (BID) | ORAL | Status: DC | PRN

## 2013-12-14 NOTE — Patient Instructions (Addendum)
Please look for a core exercise plan (or look for [facility] method) Use tylenol and heat regularly on your back If you are not better in a couple of weeks, we can go ahead with physical therapy or seeing Dr Lorelei Pont, sports medicine specialist.

## 2013-12-14 NOTE — Progress Notes (Signed)
Subjective:    Patient ID: Heather Russo, female    DOB: June 14, 1966, 48 y.o.   MRN: [id number]  HPI Feels okay when she gets up By the end of the day--- "it is killing me" Pain in right lumbar area--then across to left and hips Spends all day on feet, bending but not much lifting Pain even with just standing Started about 3 weeks ago Did have some back spasms at first--then pain since then On days off, if not on feet, she is fine  No treatment thus far Not much pain into legs No leg weakness  Current Outpatient Prescriptions on File Prior to Visit  Medication Sig Dispense Refill  . esomeprazole (NEXIUM) 20 MG capsule Take 20 mg by mouth daily before breakfast.      . fexofenadine (ALLEGRA) 180 MG tablet Take 180 mg by mouth daily as needed.        Marland Kitchen FLUoxetine (PROZAC) 40 MG capsule TAKE 2 CAPSULES BY MOUTH EVERY DAY  180 capsule  0  . HYDROcodone-acetaminophen (NORCO/VICODIN) 5-325 MG per tablet TAKE 1 TABLET BY MOUTH EVERY 4 HOURS AS NEEDED FOR PAIN  60 tablet  0  . LORazepam (ATIVAN) 1 MG tablet TAKE 1 TABLET BY MOUTH TWICE DAILY AS NEEDED  60 tablet  0  . norgestrel-ethinyl estradiol (CRYSELLE-28) 0.3-30 MG-MCG tablet TAKE ONE TABLET BY MOUTH EVERY DAY  1 Package  11  . propranolol ER (INDERAL LA) 80 MG 24 hr capsule TAKE 1 CAPSULE BY MOUTH EVERY DAY  30 capsule  0   No current facility-administered medications on file prior to visit.    Allergies  Allergen Reactions  . Bupropion Hcl     REACTION: rash, nausea, bad taste in mouth, blurred vision  . Lisinopril     REACTION: cough,dizziness  . Mirtazapine     REACTION: unspecified    Past Medical History  Diagnosis Date  . Allergic rhinitis   . Depression   . GERD (gastroesophageal reflux disease)   . HTN (hypertension)   . Migraine   . HLD (hyperlipidemia)     mild  . Tremor, essential     Past Surgical History  Procedure Laterality Date  . Esophagogastroduodenoscopy  05/05/06  . Laparoscopic  cholecystectomy  03/2008    Family History  Problem Relation Age of Onset  . Hypertension Father   . Heart attack Father   . Diabetes Father   . Hypertension Mother   . Hypertension Brother   . Hypertension Brother   . Cancer Paternal Grandmother     Lung  . Tremor Mother   . Tremor Brother     History   Social History  . Marital Status: Widowed    Spouse Name: N/A    Number of Children: 1  . Years of Education: N/A   Occupational History  .  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 Smoking status: Current Every Day Smoker    Last Attempt to Quit: 11/22/1985  . Smokeless tobacco: Never Used  . Alcohol Use: No  . Drug Use: No  . Sexual Activity: Not on file   Other Topics Concern  . Not on file   Social History Narrative   Widowed 10/12   Review of Systems Some increase in stress urinary problems--rare incontinence Bowels okay No fever Appetite is fine No exercises    Objective:   Physical Exam  Constitutional: She appears well-developed and well-nourished. No  distress.  Musculoskeletal:  Normal ROM of hips Back flexion to about 90 degrees Localized tenderness over L3-4 No lateral tenderness SLR negative  Neurological:  Normal gait No leg weakness Reflexes symmetric          Assessment & Plan:

## 2013-12-14 NOTE — Assessment & Plan Note (Signed)
History suggests muscular problem Due to localized tenderness--I will check x-ray Tylenol (can't tolerate NSAIDs) Back exercises Consider PT

## 2013-12-14 NOTE — Progress Notes (Signed)
Pre-visit discussion using our clinic review tool. No additional management support is needed unless otherwise documented below in the visit note.  

## 2013-12-19 ENCOUNTER — Other Ambulatory Visit: Payer: Self-pay | Admitting: Internal Medicine

## 2014-01-07 ENCOUNTER — Other Ambulatory Visit: Payer: Self-pay | Admitting: Internal Medicine

## 2014-01-22 ENCOUNTER — Other Ambulatory Visit: Payer: Self-pay | Admitting: Internal Medicine

## 2014-01-24 ENCOUNTER — Other Ambulatory Visit: Payer: Self-pay

## 2014-01-24 MED ORDER — HYDROCODONE-ACETAMINOPHEN 5-325 MG PO TABS: ORAL_TABLET | ORAL | Status: DC

## 2014-01-24 NOTE — Telephone Encounter (Signed)
Spoke with patient and advised rx ready for pick-up and it will be at the front desk.  

## 2014-01-24 NOTE — Telephone Encounter (Signed)
Pt left v/m requesting rx hydrocodone apap. Call when ready for pick up. Pt wants to pick up on 01/25/14.

## 2014-01-28 ENCOUNTER — Other Ambulatory Visit: Payer: Self-pay | Admitting: Internal Medicine

## 2014-02-23 ENCOUNTER — Other Ambulatory Visit: Payer: Self-pay | Admitting: Internal Medicine

## 2014-03-21 ENCOUNTER — Other Ambulatory Visit: Payer: Self-pay | Admitting: Internal Medicine

## 2014-04-03 ENCOUNTER — Encounter: Payer: Self-pay | Admitting: Internal Medicine

## 2014-04-03 ENCOUNTER — Ambulatory Visit (INDEPENDENT_AMBULATORY_CARE_PROVIDER_SITE_OTHER): Payer: Self-pay | Admitting: Internal Medicine

## 2014-04-03 ENCOUNTER — Telehealth: Payer: Self-pay | Admitting: Internal Medicine

## 2014-04-03 ENCOUNTER — Other Ambulatory Visit (HOSPITAL_COMMUNITY)
Admission: RE | Admit: 2014-04-03 | Discharge: 2014-04-03 | Disposition: A | Discharge status: Home / Self Care | Payer: Self-pay | Source: Ambulatory Visit | Attending: Internal Medicine | Admitting: Internal Medicine

## 2014-04-03 VITALS — BP 120/80 | HR 74 | Temp 98.2°F | Ht 65.0 in | Wt 151.0 lb

## 2014-04-03 DIAGNOSIS — G250 Essential tremor: Secondary | ICD-10-CM

## 2014-04-03 DIAGNOSIS — K219 Gastro-esophageal reflux disease without esophagitis: Secondary | ICD-10-CM

## 2014-04-03 DIAGNOSIS — E785 Hyperlipidemia, unspecified: Secondary | ICD-10-CM

## 2014-04-03 DIAGNOSIS — G252 Other specified forms of tremor: Secondary | ICD-10-CM

## 2014-04-03 DIAGNOSIS — Z1151 Encounter for screening for human papillomavirus (HPV): Secondary | ICD-10-CM | POA: Insufficient documentation

## 2014-04-03 DIAGNOSIS — Z01419 Encounter for gynecological examination (general) (routine) without abnormal findings: Secondary | ICD-10-CM | POA: Insufficient documentation

## 2014-04-03 DIAGNOSIS — Z0000 Encounter for general adult medical examination without abnormal findings: Secondary | ICD-10-CM | POA: Insufficient documentation

## 2014-04-03 DIAGNOSIS — F4389 Other reactions to severe stress: Secondary | ICD-10-CM

## 2014-04-03 DIAGNOSIS — F438 Other reactions to severe stress: Secondary | ICD-10-CM

## 2014-04-03 DIAGNOSIS — I10 Essential (primary) hypertension: Secondary | ICD-10-CM

## 2014-04-03 LAB — CBC WITH DIFFERENTIAL/PLATELET
Basophils Absolute: 0 10*3/uL (ref 0.0–0.1)
Basophils Relative: 0.4 % (ref 0.0–3.0)
Eosinophils Absolute: 0.5 10*3/uL (ref 0.0–0.7)
Eosinophils Relative: 4.8 % (ref 0.0–5.0)
HCT: 46.7 % — ABNORMAL HIGH (ref 36.0–46.0)
Hemoglobin: 15.8 g/dL — ABNORMAL HIGH (ref 12.0–15.0)
Lymphocytes Relative: 37 % (ref 12.0–46.0)
Lymphs Abs: 4.2 10*3/uL — ABNORMAL HIGH (ref 0.7–4.0)
MCHC: 33.8 g/dL (ref 30.0–36.0)
MCV: 90.4 fl (ref 78.0–100.0)
Monocytes Absolute: 0.6 10*3/uL (ref 0.1–1.0)
Monocytes Relative: 5 % (ref 3.0–12.0)
Neutro Abs: 6 10*3/uL (ref 1.4–7.7)
Neutrophils Relative %: 52.8 % (ref 43.0–77.0)
Platelets: 354 10*3/uL (ref 150.0–400.0)
RBC: 5.16 Mil/uL — ABNORMAL HIGH (ref 3.87–5.11)
RDW: 13.5 % (ref 11.5–15.5)
WBC: 11.3 10*3/uL — ABNORMAL HIGH (ref 4.0–10.5)

## 2014-04-03 LAB — LIPID PANEL
Cholesterol: 238 mg/dL — ABNORMAL HIGH (ref 0–200)
HDL: 39.4 mg/dL (ref >39.00)
LDL Cholesterol: 168 mg/dL — ABNORMAL HIGH (ref 0–99)
Total CHOL/HDL Ratio: 6
Triglycerides: 151 mg/dL — ABNORMAL HIGH (ref 0.0–149.0)
VLDL: 30.2 mg/dL (ref 0.0–40.0)

## 2014-04-03 LAB — COMPREHENSIVE METABOLIC PANEL
ALT: 30 U/L (ref 0–35)
AST: 26 U/L (ref 0–37)
Albumin: 3.8 g/dL (ref 3.5–5.2)
Alkaline Phosphatase: 53 U/L (ref 39–117)
BUN: 16 mg/dL (ref 6–23)
CO2: 27 mEq/L (ref 19–32)
Calcium: 9.4 mg/dL (ref 8.4–10.5)
Chloride: 101 mEq/L (ref 96–112)
Creatinine, Ser: 0.9 mg/dL (ref 0.4–1.2)
GFR: 67.41 mL/min (ref >60.00)
Glucose, Bld: 83 mg/dL (ref 70–99)
Potassium: 4.9 mEq/L (ref 3.5–5.1)
Sodium: 135 mEq/L (ref 135–145)
Total Bilirubin: 0.2 mg/dL (ref 0.2–1.2)
Total Protein: 7.2 g/dL (ref 6.0–8.3)

## 2014-04-03 LAB — T4, FREE: Free T4: 0.81 ng/dL (ref 0.60–1.60)

## 2014-04-03 LAB — TSH: TSH: 0.29 u[IU]/mL — ABNORMAL LOW (ref 0.35–4.50)

## 2014-04-03 MED ORDER — HYDROCODONE-ACETAMINOPHEN 5-325 MG PO TABS: ORAL_TABLET | ORAL | Status: DC

## 2014-04-03 MED ORDER — NORGESTREL-ETHINYL ESTRADIOL 0.3-30 MG-MCG PO TABS: 1.0000 | ORAL_TABLET | Freq: Every day | ORAL | Status: DC

## 2014-04-03 NOTE — Assessment & Plan Note (Signed)
Rarely needs lorazepam

## 2014-04-03 NOTE — Addendum Note (Signed)
Addended by: Despina Hidden on: 04/03/2014 11:38 AM   Modules accepted: Orders

## 2014-04-03 NOTE — Assessment & Plan Note (Signed)
Quiet on nexium

## 2014-04-03 NOTE — Telephone Encounter (Signed)
Relevant patient education assigned to patient using Emmi. ° °

## 2014-04-03 NOTE — Patient Instructions (Signed)
You can set up your own screening mammogram if you want to get this.

## 2014-04-03 NOTE — Assessment & Plan Note (Signed)
Generally healthy Needs to work on fitness Pap done She will schedule mammo

## 2014-04-03 NOTE — Progress Notes (Signed)
Subjective:    Patient ID: Heather Russo, female    DOB: Jul 14, 1966, 48 y.o.   MRN: [id number]  HPI Here for physical Due for pap and mammogram  Back is better  Not needing the hydrocodone much Discussed core strengthening  Still same relationship Living together since last year Not planning on getting married  Allergies have been bad allergra helps but has some breakthrough symptoms  Current Outpatient Prescriptions on File Prior to Visit  Medication Sig Dispense Refill  . esomeprazole (NEXIUM) 20 MG capsule Take 20 mg by mouth daily before breakfast.      . fexofenadine (ALLEGRA) 180 MG tablet Take 180 mg by mouth daily as needed.        Marland Kitchen FLUoxetine (PROZAC) 40 MG capsule TAKE 2 CAPSULES BY MOUTH ONE TIME DAILY  180 capsule  0  . HYDROcodone-acetaminophen (NORCO/VICODIN) 5-325 MG per tablet TAKE 1 TABLET BY MOUTH EVERY 4 HOURS AS NEEDED FOR PAIN  60 tablet  0  . LORazepam (ATIVAN) 1 MG tablet Take 0.5-1 tablets (0.5-1 mg total) by mouth 2 (two) times daily as needed for anxiety.  60 tablet  0  . propranolol ER (INDERAL LA) 80 MG 24 hr capsule TAKE ONE CAPSULE BY MOUTH EVERY DAY  30 capsule  11   No current facility-administered medications on file prior to visit.    Allergies  Allergen Reactions  . Bupropion Hcl     REACTION: rash, nausea, bad taste in mouth, blurred vision  . Lisinopril     REACTION: cough,dizziness  . Mirtazapine     REACTION: unspecified    Past Medical History  Diagnosis Date  . Allergic rhinitis   . Depression   . GERD (gastroesophageal reflux disease)   . HTN (hypertension)   . Migraine   . HLD (hyperlipidemia)     mild  . Tremor, essential     Past Surgical History  Procedure Laterality Date  . Esophagogastroduodenoscopy  05/05/06  . Laparoscopic cholecystectomy  03/2008    Family History  Problem Relation Age of Onset  . Hypertension Father   . Heart attack Father   . Diabetes Father   . Hypertension Mother   . Hypertension  Brother   . Hypertension Brother   . Cancer Paternal Grandmother     Lung  . Tremor Mother   . Tremor Brother     History   Social History  . Marital Status: Widowed    Spouse Name: N/A    Number of Children: 1  . Years of Education: N/A   Occupational History  .  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 Smoking status: Current Every Day Smoker    Last Attempt to Quit: 11/22/1985  . Smokeless tobacco: Never Used  . Alcohol Use: No  . Drug Use: No  . Sexual Activity: Not on file   Other Topics Concern  . Not on file   Social History Narrative   Widowed 10/12   Living with someone since 2014   Review of Systems  Constitutional: Negative for fatigue.       Weight down a few pounds Wears seat belt  HENT: Negative for dental problem, hearing loss and tinnitus.        Regular with dentist  Eyes: Negative for visual disturbance.       No diplopia or unilateral vision loss  Respiratory: Positive for cough. Negative for chest tightness and shortness of  breath.        Cough with the allergies  Cardiovascular: Negative for chest pain, palpitations and leg swelling.  Gastrointestinal: Negative for nausea, vomiting, abdominal pain, constipation and blood in stool.       Heartburn is rare on the nexium. No dysphagia  Endocrine: Negative for heat intolerance.  Genitourinary: Negative for dysuria, hematuria, difficulty urinating and dyspareunia.       Periods are regular on OCP Mild stress incontinence  Musculoskeletal: Positive for arthralgias and back pain.       Some left shoulder pain  Skin: Negative for rash.       No suspicious lesions  Allergic/Immunologic: Positive for environmental allergies. Negative for immunocompromised state.  Neurological: Positive for tremors. Negative for dizziness, syncope, weakness, light-headedness and numbness.       Headaches are mostly menstrual--uses the hydrocodone then Tremor better on  propranolol  Hematological: Negative for adenopathy. Does not bruise/bleed easily.  Psychiatric/Behavioral: Negative for sleep disturbance and dysphoric mood. The patient is nervous/anxious.        Objective:   Physical Exam  Constitutional: She is oriented to person, place, and time. She appears well-developed and well-nourished. No distress.  HENT:  Head: Normocephalic and atraumatic.  Right Ear: External ear normal.  Left Ear: External ear normal.  Mouth/Throat: Oropharynx is clear and moist. No oropharyngeal exudate.  Eyes: Conjunctivae and EOM are normal. Pupils are equal, round, and reactive to light.  Neck: Normal range of motion. Neck supple. No thyromegaly present.  Cardiovascular: Normal rate, regular rhythm, normal heart sounds and intact distal pulses.  Exam reveals no gallop.   No murmur heard. Pulmonary/Chest: Effort normal and breath sounds normal. No respiratory distress. She has no wheezes. She has no rales.  Abdominal: Soft. There is no tenderness.  Genitourinary:  No breast masses or tenderness  Normal introitus. Cervix normal---pap done Retroverted uterus Bimanual negative  Musculoskeletal: She exhibits no edema and no tenderness.  Lymphadenopathy:    She has no cervical adenopathy.  Neurological: She is alert and oriented to person, place, and time.  Skin: No rash noted. No erythema.  Psychiatric: She has a normal mood and affect. Her behavior is normal.          Assessment & Plan:

## 2014-04-03 NOTE — Assessment & Plan Note (Signed)
Good control on the propranolol

## 2014-04-03 NOTE — Addendum Note (Signed)
Addended by: Despina Hidden on: 04/03/2014 12:37 PM   Modules accepted: Orders

## 2014-04-03 NOTE — Assessment & Plan Note (Signed)
Discussed primary prevention--no statin for now

## 2014-04-03 NOTE — Assessment & Plan Note (Signed)
Controlled on the propranolol  BP Readings from Last 3 Encounters:  04/03/14 120/80  12/14/13 120/80  08/13/13 150/98

## 2014-04-03 NOTE — Progress Notes (Signed)
Pre visit review using our clinic review tool, if applicable. No additional management support is needed unless otherwise documented below in the visit note. 

## 2014-04-04 ENCOUNTER — Encounter: Payer: Self-pay | Admitting: *Deleted

## 2014-04-08 ENCOUNTER — Encounter: Payer: Self-pay | Admitting: *Deleted

## 2014-04-26 ENCOUNTER — Encounter: Payer: Self-pay | Admitting: Internal Medicine

## 2014-05-21 ENCOUNTER — Other Ambulatory Visit: Payer: Self-pay | Admitting: Family Medicine

## 2014-05-21 MED ORDER — HYDROCODONE-ACETAMINOPHEN 5-325 MG PO TABS: ORAL_TABLET | ORAL | Status: DC

## 2014-05-21 NOTE — Telephone Encounter (Signed)
Spoke with patient and advised rx ready for pick-up and it will be at the front desk.  

## 2014-05-21 NOTE — Telephone Encounter (Signed)
Pt called requesting refill.  Last filled 04/03/14.

## 2014-06-10 ENCOUNTER — Other Ambulatory Visit: Payer: Self-pay | Admitting: Internal Medicine

## 2014-06-10 NOTE — Telephone Encounter (Signed)
Pre visit review using our clinic review tool, if applicable. No additional management support is needed unless otherwise documented below in the visit note. 

## 2014-06-10 NOTE — Telephone Encounter (Signed)
12/14/13 

## 2014-06-10 NOTE — Telephone Encounter (Signed)
Okay #60 x 0 

## 2014-07-03 ENCOUNTER — Other Ambulatory Visit: Payer: Self-pay

## 2014-07-03 MED ORDER — HYDROCODONE-ACETAMINOPHEN 5-325 MG PO TABS: ORAL_TABLET | ORAL | Status: DC

## 2014-07-03 NOTE — Telephone Encounter (Signed)
Pt left v/m requesting rx hydrocodone apap. Call when ready for pick up.  

## 2014-07-03 NOTE — Telephone Encounter (Signed)
Left message on machine that rx is ready for pick-up, and it will be at our front desk.  

## 2014-07-04 NOTE — Telephone Encounter (Signed)
Pt request status of hydrocodone apap rx. Advised pt at front desk for pick up.

## 2014-08-12 ENCOUNTER — Other Ambulatory Visit: Payer: Self-pay | Admitting: Internal Medicine

## 2014-08-12 NOTE — Telephone Encounter (Signed)
06/10/14 

## 2014-08-13 NOTE — Telephone Encounter (Signed)
rx called into pharmacy

## 2014-08-13 NOTE — Telephone Encounter (Signed)
Okay #60 x 0 

## 2014-08-23 ENCOUNTER — Ambulatory Visit: Payer: Self-pay | Admitting: Internal Medicine

## 2014-08-28 ENCOUNTER — Encounter: Payer: Self-pay | Admitting: Internal Medicine

## 2014-08-28 ENCOUNTER — Ambulatory Visit (INDEPENDENT_AMBULATORY_CARE_PROVIDER_SITE_OTHER)
Admission: RE | Admit: 2014-08-28 | Discharge: 2014-08-28 | Disposition: A | Discharge status: Home / Self Care | Payer: Self-pay | Source: Ambulatory Visit | Attending: Internal Medicine | Admitting: Internal Medicine

## 2014-08-28 ENCOUNTER — Ambulatory Visit (INDEPENDENT_AMBULATORY_CARE_PROVIDER_SITE_OTHER): Payer: Self-pay | Admitting: Internal Medicine

## 2014-08-28 VITALS — BP 124/82 | HR 74 | Temp 98.3°F | Resp 14 | Wt 154.0 lb

## 2014-08-28 DIAGNOSIS — R05 Cough: Secondary | ICD-10-CM

## 2014-08-28 DIAGNOSIS — R059 Cough, unspecified: Secondary | ICD-10-CM | POA: Insufficient documentation

## 2014-08-28 MED ORDER — HYDROCODONE-ACETAMINOPHEN 5-325 MG PO TABS: ORAL_TABLET | ORAL | Status: DC

## 2014-08-28 MED ORDER — AMOXICILLIN 500 MG PO TABS: 1000.0000 mg | ORAL_TABLET | Freq: Two times a day (BID) | ORAL | Status: DC

## 2014-08-28 NOTE — Progress Notes (Signed)
Pre visit review using our clinic review tool, if applicable. No additional management support is needed unless otherwise documented below in the visit note. 

## 2014-08-28 NOTE — Assessment & Plan Note (Signed)
Ongoing for 2 months Probably some bronchitis ---exacerbated by stopping smoking CXR looks normal Will try antibiotic OTC cough meds

## 2014-08-28 NOTE — Progress Notes (Signed)
Subjective:    Patient ID: Heather Russo, female    DOB: 02/20/1966, 48 y.o.   MRN: [id number]  HPI Having cough "I can't get rid of"-- couple of months Feels like it is "sticking in my throat" Hurts in back also  Has tried nyquil, tussin and delsym---brief help Dry cough Some SOB--mostly with activity No fever  No head congestion, nasal drainage or  Sig throat symptoms (some sore throat with coughing)  Stopped smoking about a month ago Cough started before this though  Current Outpatient Prescriptions on File Prior to Visit  Medication Sig Dispense Refill  . esomeprazole (NEXIUM) 20 MG capsule Take 20 mg by mouth daily before breakfast.      . fexofenadine (ALLEGRA) 180 MG tablet Take 180 mg by mouth daily as needed.        Marland Kitchen HYDROcodone-acetaminophen (NORCO/VICODIN) 5-325 MG per tablet TAKE 1 TABLET BY MOUTH EVERY 4 HOURS AS NEEDED FOR PAIN  60 tablet  0  . LORazepam (ATIVAN) 1 MG tablet TAKE ONE-HALF TO ONE TABLET BY MOUTH TWICE DAILY FOR ANXIETY  60 tablet  0  . norgestrel-ethinyl estradiol (LOW-OGESTREL) 0.3-30 MG-MCG tablet Take 1 tablet by mouth daily.  3 Package  3  . propranolol ER (INDERAL LA) 80 MG 24 hr capsule TAKE ONE CAPSULE BY MOUTH EVERY DAY  30 capsule  11   No current facility-administered medications on file prior to visit.    Allergies  Allergen Reactions  . Bupropion Hcl     REACTION: rash, nausea, bad taste in mouth, blurred vision  . Lisinopril     REACTION: cough,dizziness  . Mirtazapine     REACTION: unspecified    Past Medical History  Diagnosis Date  . Allergic rhinitis   . Depression   . GERD (gastroesophageal reflux disease)   . HTN (hypertension)   . Migraine   . HLD (hyperlipidemia)     mild  . Tremor, essential     Past Surgical History  Procedure Laterality Date  . Esophagogastroduodenoscopy  05/05/06  . Laparoscopic cholecystectomy  03/2008    Family History  Problem Relation Age of Onset  . Hypertension Father   . Heart  attack Father   . Diabetes Father   . Hypertension Mother   . Hypertension Brother   . Hypertension Brother   . Cancer Paternal Grandmother     Lung  . Tremor Mother   . Tremor Brother     History   Social History  . Marital Status: Widowed    Spouse Name: N/A    Number of Children: 1  . Years of Education: N/A   Occupational History  .  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 Smoking status: Former Smoker    Quit date: 07/23/2014  . Smokeless tobacco: Never Used  . Alcohol Use: No  . Drug Use: No  . Sexual Activity: Not on file   Other Topics Concern  . Not on file   Social History Narrative   Widowed 10/12   Living with someone since 2014   Review of Systems No rash Some nausea but no vomiting (will gag) No diarrhea Appetite is off    Objective:   Physical Exam  Constitutional: She appears well-developed and well-nourished. No distress.  occ dry cough  HENT:  Mouth/Throat: Oropharynx is clear and moist. No oropharyngeal exudate.  Mild maxillary tenderness Mild nasal inflammation TMs normal  Neck: Normal range  of motion. Neck supple. No thyromegaly present.  Pulmonary/Chest: Effort normal and breath sounds normal. No respiratory distress. She has no wheezes. She has no rales.  Lymphadenopathy:    She has no cervical adenopathy.          Assessment & Plan:

## 2014-10-08 ENCOUNTER — Other Ambulatory Visit: Payer: Self-pay | Admitting: Internal Medicine

## 2014-10-08 NOTE — Telephone Encounter (Signed)
rx called into pharmacy

## 2014-10-08 NOTE — Telephone Encounter (Signed)
Approved: #60 x 0 

## 2014-10-08 NOTE — Telephone Encounter (Signed)
08/13/14 

## 2014-11-29 ENCOUNTER — Other Ambulatory Visit: Payer: Self-pay

## 2014-11-29 NOTE — Telephone Encounter (Signed)
Pt left v/m requesting rx hydrocodone apap. Call when ready for pick up.  

## 2014-11-30 NOTE — Telephone Encounter (Signed)
Approved: okay to prepare Rx for signature  #60 x 0

## 2014-12-02 MED ORDER — HYDROCODONE-ACETAMINOPHEN 5-325 MG PO TABS: ORAL_TABLET | ORAL | Status: DC

## 2014-12-02 NOTE — Telephone Encounter (Signed)
Spoke with patient and advised rx ready for pick-up and it will be at the front desk.  

## 2014-12-09 ENCOUNTER — Other Ambulatory Visit: Payer: Self-pay | Admitting: Internal Medicine

## 2014-12-09 NOTE — Telephone Encounter (Signed)
10/08/14 

## 2014-12-10 NOTE — Telephone Encounter (Signed)
rx called into pharmacy

## 2014-12-10 NOTE — Telephone Encounter (Signed)
Approved: #60 x 0 

## 2015-01-23 ENCOUNTER — Other Ambulatory Visit: Payer: Self-pay

## 2015-01-23 MED ORDER — HYDROCODONE-ACETAMINOPHEN 5-325 MG PO TABS: ORAL_TABLET | ORAL | Status: DC

## 2015-01-23 NOTE — Telephone Encounter (Signed)
Pt left v/m requesting rx hydrocodone apap. Call when ready for pick up. Pt last annual on 04/03/14 and pt is scheduled [facility] on 04/07/2015.

## 2015-01-24 NOTE — Telephone Encounter (Signed)
Patient aware hydrocodone rx is ready for pick up. 

## 2015-01-29 ENCOUNTER — Other Ambulatory Visit: Payer: Self-pay | Admitting: Internal Medicine

## 2015-02-02 ENCOUNTER — Other Ambulatory Visit: Payer: Self-pay | Admitting: Internal Medicine

## 2015-02-03 NOTE — Telephone Encounter (Signed)
rx called in to [facility] in [place] on [address]

## 2015-02-03 NOTE — Telephone Encounter (Signed)
Approved: #60 x 0 

## 2015-02-03 NOTE — Telephone Encounter (Signed)
12/10/2014 

## 2015-03-16 ENCOUNTER — Other Ambulatory Visit: Payer: Self-pay | Admitting: Internal Medicine

## 2015-04-07 ENCOUNTER — Encounter: Payer: Self-pay | Admitting: Internal Medicine

## 2015-04-07 ENCOUNTER — Ambulatory Visit (INDEPENDENT_AMBULATORY_CARE_PROVIDER_SITE_OTHER): Payer: Self-pay | Admitting: Internal Medicine

## 2015-04-07 VITALS — BP 110/70 | HR 82 | Temp 97.9°F | Ht 65.0 in | Wt 146.0 lb

## 2015-04-07 DIAGNOSIS — I10 Essential (primary) hypertension: Secondary | ICD-10-CM

## 2015-04-07 DIAGNOSIS — Z0000 Encounter for general adult medical examination without abnormal findings: Secondary | ICD-10-CM

## 2015-04-07 DIAGNOSIS — G250 Essential tremor: Secondary | ICD-10-CM

## 2015-04-07 DIAGNOSIS — K219 Gastro-esophageal reflux disease without esophagitis: Secondary | ICD-10-CM

## 2015-04-07 DIAGNOSIS — R251 Tremor, unspecified: Secondary | ICD-10-CM

## 2015-04-07 DIAGNOSIS — E785 Hyperlipidemia, unspecified: Secondary | ICD-10-CM

## 2015-04-07 DIAGNOSIS — G252 Other specified forms of tremor: Secondary | ICD-10-CM

## 2015-04-07 LAB — COMPREHENSIVE METABOLIC PANEL
ALT: 30 U/L (ref 0–35)
AST: 23 U/L (ref 0–37)
Albumin: 4 g/dL (ref 3.5–5.2)
Alkaline Phosphatase: 56 U/L (ref 39–117)
BUN: 10 mg/dL (ref 6–23)
CO2: 28 mEq/L (ref 19–32)
Calcium: 9.9 mg/dL (ref 8.4–10.5)
Chloride: 102 mEq/L (ref 96–112)
Creatinine, Ser: 0.83 mg/dL (ref 0.40–1.20)
GFR: 77.5 mL/min (ref >60.00)
Glucose, Bld: 89 mg/dL (ref 70–99)
Potassium: 4.4 mEq/L (ref 3.5–5.1)
Sodium: 135 mEq/L (ref 135–145)
Total Bilirubin: 0.4 mg/dL (ref 0.2–1.2)
Total Protein: 7 g/dL (ref 6.0–8.3)

## 2015-04-07 LAB — LIPID PANEL
Cholesterol: 266 mg/dL — ABNORMAL HIGH (ref 0–200)
HDL: 40.2 mg/dL (ref >39.00)
LDL Cholesterol: 197 mg/dL — ABNORMAL HIGH (ref 0–99)
NonHDL: 225.8
Total CHOL/HDL Ratio: 7
Triglycerides: 145 mg/dL (ref 0.0–149.0)
VLDL: 29 mg/dL (ref 0.0–40.0)

## 2015-04-07 LAB — CBC WITH DIFFERENTIAL/PLATELET
Basophils Absolute: 0.1 10*3/uL (ref 0.0–0.1)
Basophils Relative: 0.5 % (ref 0.0–3.0)
Eosinophils Absolute: 0.3 10*3/uL (ref 0.0–0.7)
Eosinophils Relative: 2.3 % (ref 0.0–5.0)
HCT: 47.5 % — ABNORMAL HIGH (ref 36.0–46.0)
Hemoglobin: 16.2 g/dL — ABNORMAL HIGH (ref 12.0–15.0)
Lymphocytes Relative: 27.6 % (ref 12.0–46.0)
Lymphs Abs: 3.1 10*3/uL (ref 0.7–4.0)
MCHC: 34.2 g/dL (ref 30.0–36.0)
MCV: 88.1 fl (ref 78.0–100.0)
Monocytes Absolute: 0.5 10*3/uL (ref 0.1–1.0)
Monocytes Relative: 4.7 % (ref 3.0–12.0)
Neutro Abs: 7.3 10*3/uL (ref 1.4–7.7)
Neutrophils Relative %: 64.9 % (ref 43.0–77.0)
Platelets: 353 10*3/uL (ref 150.0–400.0)
RBC: 5.39 Mil/uL — ABNORMAL HIGH (ref 3.87–5.11)
RDW: 13.4 % (ref 11.5–15.5)
WBC: 11.3 10*3/uL — ABNORMAL HIGH (ref 4.0–10.5)

## 2015-04-07 LAB — T4, FREE: Free T4: 0.86 ng/dL (ref 0.60–1.60)

## 2015-04-07 MED ORDER — HYDROCODONE-ACETAMINOPHEN 5-325 MG PO TABS: ORAL_TABLET | ORAL | Status: DC

## 2015-04-07 MED ORDER — LORAZEPAM 1 MG PO TABS: 0.5000 mg | ORAL_TABLET | Freq: Two times a day (BID) | ORAL | Status: DC | PRN

## 2015-04-07 NOTE — Progress Notes (Signed)
Subjective:    Patient ID: Heather Russo, female    DOB: 12-07-1965, 49 y.o.   MRN: [id number]  HPI Here for physical No new concerns  Notices that she skips some periods Some mild mood changes Slight hot flashes Still on OCP In same relationship--he uses condoms  Tremors are about the same Still on propranolol--satisfied with this  Current Outpatient Prescriptions on File Prior to Visit  Medication Sig Dispense Refill  . esomeprazole (NEXIUM) 20 MG capsule Take 20 mg by mouth daily before breakfast.    . fexofenadine (ALLEGRA) 180 MG tablet Take 180 mg by mouth daily as needed.      Marland Kitchen HYDROcodone-acetaminophen (NORCO/VICODIN) 5-325 MG per tablet TAKE 1 TABLET BY MOUTH EVERY 4 HOURS AS NEEDED FOR PAIN 60 tablet 0  . LORazepam (ATIVAN) 1 MG tablet TAKE 1/2 TO 1 TABLET BY MOUTH 2 TIMES DAILY AS NEEDED FOR ANXIETY 60 tablet 0  . LOW-OGESTREL 0.3-30 MG-MCG tablet TAKE 1 TABLET BY MOUTH DAILY 84 tablet 0  . propranolol ER (INDERAL LA) 80 MG 24 hr capsule TAKE ONE CAPSULE BY MOUTH EVERY DAY 30 capsule 2   No current facility-administered medications on file prior to visit.    Allergies  Allergen Reactions  . Bupropion Hcl     REACTION: rash, nausea, bad taste in mouth, blurred vision  . Lisinopril     REACTION: cough,dizziness  . Mirtazapine     REACTION: unspecified    Past Medical History  Diagnosis Date  . Allergic rhinitis   . Depression   . GERD (gastroesophageal reflux disease)   . HTN (hypertension)   . Migraine   . HLD (hyperlipidemia)     mild  . Tremor, essential     Past Surgical History  Procedure Laterality Date  . Esophagogastroduodenoscopy  05/05/06  . Laparoscopic cholecystectomy  03/2008    Family History  Problem Relation Age of Onset  . Hypertension Father   . Heart attack Father   . Diabetes Father   . Hypertension Mother   . Hypertension Brother   . Hypertension Brother   . Cancer Paternal Grandmother     Lung  . Tremor Mother   .  Tremor Brother     History   Social History  . Marital Status: Widowed    Spouse Name: N/A  . Number of Children: 1  . Years of Education: N/A   Occupational History  .  Inventory clerk-- laid off 2015 [employer]        Social History Main Topics  . Smoking status: Former Smoker    Quit date: 07/23/2014  . Smokeless tobacco: Never Used  . Alcohol Use: No  . Drug Use: No  . Sexual Activity: Not on file   Other Topics Concern  . Not on file   Social History Narrative   Widowed 10/12   Living with someone since 2014   Review of Systems  Constitutional: Negative for unexpected weight change.       Wears seat belt Still not much energy No exercise--discussed  HENT: Negative for dental problem, hearing loss and tinnitus.        Keeps up with dentist  Eyes: Negative for visual disturbance.       No diplopia or unilateral vision loss  Respiratory: Negative for cough, chest tightness and shortness of breath.   Cardiovascular: Negative for chest pain, palpitations and leg swelling.  Gastrointestinal: Negative for nausea, vomiting, abdominal pain, constipation and blood in stool.  No heartburn as long as she takes the nexium  Endocrine: Negative for polydipsia and polyuria.  Genitourinary: Negative for dysuria, hematuria and dyspareunia.       Some missed periods  Musculoskeletal: Positive for back pain and neck pain.       Neck and low back pain  Skin: Negative for rash.       No suspicious lesions  Allergic/Immunologic: Positive for environmental allergies. Negative for immunocompromised state.  Neurological: Positive for tremors and headaches. Negative for dizziness, syncope, weakness and light-headedness.       Premenstrual headaches at times  Hematological: Negative for adenopathy. Does not bruise/bleed easily.  Psychiatric/Behavioral: Positive for sleep disturbance. Negative for dysphoric mood. The patient is not nervous/anxious.        Sleeps  well with lorazepam--but doesn't take it every night though       Objective:   Physical Exam  Constitutional: She is oriented to person, place, and time. She appears well-developed and well-nourished. No distress.  HENT:  Head: Normocephalic and atraumatic.  Right Ear: External ear normal.  Left Ear: External ear normal.  Mouth/Throat: Oropharynx is clear and moist. No oropharyngeal exudate.  Eyes: Conjunctivae and EOM are normal. Pupils are equal, round, and reactive to light.  Neck: Normal range of motion. Neck supple. No thyromegaly present.  Cardiovascular: Normal rate, regular rhythm, normal heart sounds and intact distal pulses.  Exam reveals no gallop.   No murmur heard. Pulmonary/Chest: Effort normal and breath sounds normal. No respiratory distress. She has no wheezes. She has no rales.  Abdominal: Soft. There is no tenderness.  Musculoskeletal: She exhibits no edema or tenderness.  Lymphadenopathy:    She has no cervical adenopathy.  Neurological: She is alert and oriented to person, place, and time.  Skin: No rash noted. No erythema.  Psychiatric: She has a normal mood and affect. Her behavior is normal.          Assessment & Plan:

## 2015-04-07 NOTE — Assessment & Plan Note (Signed)
Mild only on propranolol

## 2015-04-07 NOTE — Progress Notes (Signed)
Pre visit review using our clinic review tool, if applicable. No additional management support is needed unless otherwise documented below in the visit note. 

## 2015-04-07 NOTE — Assessment & Plan Note (Signed)
Quiet on PPI 

## 2015-04-07 NOTE — Patient Instructions (Signed)
I recommend you stop smoking again. Call [facility] [facility] for help.

## 2015-04-07 NOTE — Assessment & Plan Note (Signed)
BP Readings from Last 3 Encounters:  04/07/15 110/70  08/28/14 124/82  04/03/14 120/80   Good control on tremor preventative

## 2015-04-07 NOTE — Assessment & Plan Note (Signed)
She is considering primary prevention with statin--but not sure now Will recheck

## 2015-04-07 NOTE — Assessment & Plan Note (Signed)
Healthy Discussed mammogram and breast exam--we have deferred She may want to stop the birth control Discussed cigarette cessation--only quit for 1 month and live in also smokes

## 2015-04-08 ENCOUNTER — Other Ambulatory Visit: Payer: Self-pay | Admitting: *Deleted

## 2015-04-08 ENCOUNTER — Encounter: Payer: Self-pay | Admitting: *Deleted

## 2015-04-08 MED ORDER — ATORVASTATIN CALCIUM 20 MG PO TABS: 20.0000 mg | ORAL_TABLET | Freq: Every day | ORAL | Status: DC

## 2015-05-06 ENCOUNTER — Other Ambulatory Visit: Payer: Self-pay | Admitting: Internal Medicine

## 2015-05-19 ENCOUNTER — Other Ambulatory Visit: Payer: Self-pay | Admitting: Internal Medicine

## 2015-05-19 DIAGNOSIS — E785 Hyperlipidemia, unspecified: Secondary | ICD-10-CM

## 2015-05-27 ENCOUNTER — Other Ambulatory Visit: Payer: Self-pay

## 2015-05-27 ENCOUNTER — Other Ambulatory Visit (INDEPENDENT_AMBULATORY_CARE_PROVIDER_SITE_OTHER): Payer: Self-pay

## 2015-05-27 DIAGNOSIS — E785 Hyperlipidemia, unspecified: Secondary | ICD-10-CM

## 2015-05-27 LAB — HEPATIC FUNCTION PANEL
ALT: 15 U/L (ref 0–35)
AST: 14 U/L (ref 0–37)
Albumin: 3.8 g/dL (ref 3.5–5.2)
Alkaline Phosphatase: 58 U/L (ref 39–117)
Bilirubin, Direct: 0 mg/dL (ref 0.0–0.3)
Total Bilirubin: 0.2 mg/dL (ref 0.2–1.2)
Total Protein: 6.8 g/dL (ref 6.0–8.3)

## 2015-05-27 LAB — LIPID PANEL
Cholesterol: 144 mg/dL (ref 0–200)
HDL: 33.2 mg/dL — ABNORMAL LOW (ref >39.00)
LDL Cholesterol: 92 mg/dL (ref 0–99)
NonHDL: 110.8
Total CHOL/HDL Ratio: 4
Triglycerides: 93 mg/dL (ref 0.0–149.0)
VLDL: 18.6 mg/dL (ref 0.0–40.0)

## 2015-05-27 MED ORDER — HYDROCODONE-ACETAMINOPHEN 5-325 MG PO TABS: ORAL_TABLET | ORAL | Status: DC

## 2015-05-27 NOTE — Telephone Encounter (Signed)
Pt left v/m requesting rx hydrocodone apap. Call when ready for pick up. Pt last seen and rx last printed # 108 on 04/07/15.

## 2015-05-27 NOTE — Telephone Encounter (Signed)
Could not leave message mailbox is full, will leave rx up front for pick-up

## 2015-05-28 ENCOUNTER — Encounter: Payer: Self-pay | Admitting: *Deleted

## 2015-06-06 ENCOUNTER — Other Ambulatory Visit: Payer: Self-pay | Admitting: Internal Medicine

## 2015-06-06 NOTE — Telephone Encounter (Signed)
Refill request. Last prescribed on 05/06/15. Was last seen on 04/07/15.

## 2015-06-07 NOTE — Telephone Encounter (Signed)
Approved: okay to refill for a year 

## 2015-06-12 ENCOUNTER — Other Ambulatory Visit: Payer: Self-pay | Admitting: Internal Medicine

## 2015-06-12 NOTE — Telephone Encounter (Signed)
04/07/15 

## 2015-06-12 NOTE — Telephone Encounter (Signed)
Approved:okay #60 x 0 

## 2015-06-13 NOTE — Telephone Encounter (Signed)
rx called into pharmacy

## 2015-06-18 ENCOUNTER — Other Ambulatory Visit: Payer: Self-pay | Admitting: Internal Medicine

## 2015-07-25 ENCOUNTER — Ambulatory Visit (INDEPENDENT_AMBULATORY_CARE_PROVIDER_SITE_OTHER): Payer: Self-pay | Admitting: Family Medicine

## 2015-07-25 ENCOUNTER — Encounter: Payer: Self-pay | Admitting: Family Medicine

## 2015-07-25 VITALS — BP 100/80 | HR 70 | Temp 98.6°F | Ht 65.0 in | Wt 137.2 lb

## 2015-07-25 DIAGNOSIS — R110 Nausea: Secondary | ICD-10-CM | POA: Insufficient documentation

## 2015-07-25 DIAGNOSIS — R5383 Other fatigue: Secondary | ICD-10-CM

## 2015-07-25 DIAGNOSIS — R634 Abnormal weight loss: Secondary | ICD-10-CM | POA: Insufficient documentation

## 2015-07-25 DIAGNOSIS — R197 Diarrhea, unspecified: Secondary | ICD-10-CM | POA: Insufficient documentation

## 2015-07-25 DIAGNOSIS — R1013 Epigastric pain: Secondary | ICD-10-CM

## 2015-07-25 LAB — CBC WITH DIFFERENTIAL/PLATELET
Basophils Absolute: 0 10*3/uL (ref 0.0–0.1)
Basophils Relative: 0.4 % (ref 0.0–3.0)
Eosinophils Absolute: 0.2 10*3/uL (ref 0.0–0.7)
Eosinophils Relative: 2.5 % (ref 0.0–5.0)
HCT: 46.2 % — ABNORMAL HIGH (ref 36.0–46.0)
Hemoglobin: 15.4 g/dL — ABNORMAL HIGH (ref 12.0–15.0)
Lymphocytes Relative: 29.9 % (ref 12.0–46.0)
Lymphs Abs: 3 10*3/uL (ref 0.7–4.0)
MCHC: 33.5 g/dL (ref 30.0–36.0)
MCV: 89.8 fl (ref 78.0–100.0)
Monocytes Absolute: 0.4 10*3/uL (ref 0.1–1.0)
Monocytes Relative: 4.3 % (ref 3.0–12.0)
Neutro Abs: 6.2 10*3/uL (ref 1.4–7.7)
Neutrophils Relative %: 62.9 % (ref 43.0–77.0)
Platelets: 328 10*3/uL (ref 150.0–400.0)
RBC: 5.14 Mil/uL — ABNORMAL HIGH (ref 3.87–5.11)
RDW: 14 % (ref 11.5–15.5)
WBC: 9.9 10*3/uL (ref 4.0–10.5)

## 2015-07-25 LAB — COMPREHENSIVE METABOLIC PANEL
ALT: 55 U/L — ABNORMAL HIGH (ref 0–35)
AST: 33 U/L (ref 0–37)
Albumin: 4 g/dL (ref 3.5–5.2)
Alkaline Phosphatase: 49 U/L (ref 39–117)
BUN: 10 mg/dL (ref 6–23)
CO2: 28 mEq/L (ref 19–32)
Calcium: 9.3 mg/dL (ref 8.4–10.5)
Chloride: 105 mEq/L (ref 96–112)
Creatinine, Ser: 0.81 mg/dL (ref 0.40–1.20)
GFR: 79.61 mL/min (ref >60.00)
Glucose, Bld: 85 mg/dL (ref 70–99)
Potassium: 4.4 mEq/L (ref 3.5–5.1)
Sodium: 140 mEq/L (ref 135–145)
Total Bilirubin: 0.5 mg/dL (ref 0.2–1.2)
Total Protein: 6.8 g/dL (ref 6.0–8.3)

## 2015-07-25 LAB — LIPASE: Lipase: 19 U/L (ref 11.0–59.0)

## 2015-07-25 MED ORDER — LORAZEPAM 1 MG PO TABS: 0.5000 mg | ORAL_TABLET | Freq: Two times a day (BID) | ORAL | Status: DC | PRN

## 2015-07-25 MED ORDER — HYDROCODONE-ACETAMINOPHEN 5-325 MG PO TABS: ORAL_TABLET | ORAL | Status: DC

## 2015-07-25 NOTE — Assessment & Plan Note (Signed)
Likely due to malnutrition with difficulty eating. Encouraged po intake bland foods.

## 2015-07-25 NOTE — Progress Notes (Signed)
Pre visit review using our clinic review tool, if applicable. No additional management support is needed unless otherwise documented below in the visit note. 

## 2015-07-25 NOTE — Progress Notes (Signed)
Subjective:    Patient ID: Heather Russo, female    DOB: [birth year], 49 y.o.   MRN: [id number]  HPI   49 year old female pt of Dr. Silvio Pate with history of anxiety, GERD, HTN presents with new onset  Fatigue, nausea,   diarrhea 15 min after eating and abdominal pain x 1 and 1/2 months  She reports epigastric abdominal pain, nausea and diarrhea, watery after eating. Feels if not eating. Not occuring with particular foods, stopped fried foods, did not change.  Has 2 BMs watery a day. Occ pain radiates to under left shoulder blade.  She has not been able to eat given nausea. Only eating 1 meals a day.  Using nexium 20-40mg  for GERD.Marland Kitchen No symptoms of  Reflux. No blood in stool, no melena. No fever.  She has lost 10 lbs in last several month. Wt Readings from Last 3 Encounters:  07/25/15 137 lb 4 oz (62.256 kg)  04/07/15 146 lb (66.225 kg)  08/28/14 154 lb (69.854 kg)   No past similar symptoms. She is s/p cholecystectomy 12 year ago. No new meds. No recent travel. No sick contacts.  rare ETOH use. 81 mg asa daily.  Last endoscopy  2007 hiatal hernia and needed dilation for stricture.  Anxiety, stable: asking for a refill of alprazolam.  On hydrocodone for migraine and neck pain  Last seen by PCP at  [facility] in 03/2105. Did not discuss refill of controlled substances at that time. Last refill 7/22 and 7/5.  Low risk on controlled sub FYI. Review of Systems  Constitutional: Positive for fatigue. Negative for fever.  HENT: Negative for ear pain.   Eyes: Negative for pain.  Respiratory: Negative for chest tightness and shortness of breath.   Cardiovascular: Negative for chest pain, palpitations and leg swelling.  Gastrointestinal: Positive for abdominal pain.  Genitourinary: Negative for dysuria.       Objective:   Physical Exam  Constitutional: Vital signs are normal. She appears well-developed and well-nourished. She is cooperative.  Non-toxic appearance. She does not appear ill.  No distress.  HENT:  Head: Normocephalic.  Right Ear: Hearing, tympanic membrane, external ear and ear canal normal. Tympanic membrane is not erythematous, not retracted and not bulging.  Left Ear: Hearing, tympanic membrane, external ear and ear canal normal. Tympanic membrane is not erythematous, not retracted and not bulging.  Nose: No mucosal edema or rhinorrhea. Right sinus exhibits no maxillary sinus tenderness and no frontal sinus tenderness. Left sinus exhibits no maxillary sinus tenderness and no frontal sinus tenderness.  Mouth/Throat: Uvula is midline, oropharynx is clear and moist and mucous membranes are normal.  Eyes: Conjunctivae, EOM and lids are normal. Pupils are equal, round, and reactive to light. Lids are everted and swept, no foreign bodies found.  Neck: Trachea normal and normal range of motion. Neck supple. Carotid bruit is not present. No thyroid mass and no thyromegaly present.  Cardiovascular: Normal rate, regular rhythm, S1 normal, S2 normal, normal heart sounds, intact distal pulses and normal pulses.  Exam reveals no gallop and no friction rub.   No murmur heard. Pulmonary/Chest: Effort normal and breath sounds normal. No tachypnea. No respiratory distress. She has no decreased breath sounds. She has no wheezes. She has no rhonchi. She has no rales.  Abdominal: Soft. Normal appearance and bowel sounds are normal. There is no hepatosplenomegaly. There is tenderness in the epigastric area. There is no rigidity, no rebound, no guarding and no CVA tenderness. No hernia.  Neurological:  She is alert.  Skin: Skin is warm, dry and intact. No rash noted.  Psychiatric: Her speech is normal and behavior is normal. Judgment and thought content normal. Her mood appears not anxious. Cognition and memory are normal. She does not exhibit a depressed mood.          Assessment & Plan:

## 2015-07-25 NOTE — Assessment & Plan Note (Signed)
Concerning for gastritis vs PUD vs pancreatitis. Eval with CMP, lipisae.Hpylori, cbc. Increase PPI to 40 mg daily.  Avoid NSAIDS.

## 2015-07-25 NOTE — Patient Instructions (Addendum)
Increase nexium to 40 mg daily. Avoid ibuprofen/aleve, hold aspirin. Stop at lab on way out, we will call with those results as soon as they are back.

## 2015-07-29 ENCOUNTER — Other Ambulatory Visit: Payer: Self-pay | Admitting: Family Medicine

## 2015-07-29 ENCOUNTER — Other Ambulatory Visit (INDEPENDENT_AMBULATORY_CARE_PROVIDER_SITE_OTHER): Payer: Self-pay | Admitting: Internal Medicine

## 2015-07-29 ENCOUNTER — Telehealth: Payer: Self-pay | Admitting: *Deleted

## 2015-07-29 DIAGNOSIS — R5383 Other fatigue: Secondary | ICD-10-CM

## 2015-07-29 DIAGNOSIS — R1013 Epigastric pain: Secondary | ICD-10-CM

## 2015-07-29 DIAGNOSIS — R634 Abnormal weight loss: Secondary | ICD-10-CM

## 2015-07-29 DIAGNOSIS — R197 Diarrhea, unspecified: Secondary | ICD-10-CM

## 2015-07-29 DIAGNOSIS — R110 Nausea: Secondary | ICD-10-CM

## 2015-07-29 MED ORDER — PANTOPRAZOLE SODIUM 40 MG PO TBEC: 40.0000 mg | DELAYED_RELEASE_TABLET | Freq: Every day | ORAL | Status: DC

## 2015-07-29 MED ORDER — SUCRALFATE 1 GM/10ML PO SUSP: 1.0000 g | Freq: Three times a day (TID) | ORAL | Status: DC

## 2015-07-29 NOTE — Telephone Encounter (Signed)
-----   Message from Jinny Sanders, MD sent at 07/29/2015  1:50 PM EDT ----- If pt has not used protonix in past. Have her hold nexium and change to protonix 40 mg daily #30, 3RF. I will also call in carafate which can coat her stomach to protect it from acid. Await Hpylori test.  If still not improving after 1-2 weeks will need to refer to GI.

## 2015-07-29 NOTE — Addendum Note (Signed)
Addended by: Ellamae Sia on: 07/29/2015 11:01 AM   Modules accepted: Orders

## 2015-07-29 NOTE — Telephone Encounter (Signed)
Spoke with Heather Russo.  She has not taken Protonix in the past.  Advised to hold the Nexium and start Protonix 40 mg daily and carafate daily to coat her stomach to protect it from acid.  If still not improving after 1-2 week will refer to GI.  Prescriptions sent to [facility] in [place].

## 2015-07-30 ENCOUNTER — Telehealth: Payer: Self-pay

## 2015-07-30 NOTE — Telephone Encounter (Signed)
Pt left v/m requesting cb when H pylori results are available.

## 2015-07-31 LAB — HELICOBACTER PYLORI  SPECIAL ANTIGEN: H. PYLORI Antigen: NOT DETECTED

## 2015-09-17 ENCOUNTER — Telehealth: Payer: Self-pay | Admitting: *Deleted

## 2015-09-17 MED ORDER — LORAZEPAM 1 MG PO TABS: 0.5000 mg | ORAL_TABLET | Freq: Two times a day (BID) | ORAL | Status: DC | PRN

## 2015-09-17 NOTE — Telephone Encounter (Signed)
Spoke with patient and advised results, it was her fiance that passed. rx called into pharmacy

## 2015-09-17 NOTE — Telephone Encounter (Signed)
Her husband died a fair while ago--so I am not sure I understand the message. She is due for refill on her lorazepam so okay to phone in #60 x 0 and let her know it is available

## 2015-09-17 NOTE — Telephone Encounter (Signed)
Pt is asking for something for anxiety, pt's daughter on the phone and pt in the background and she states her husband passed this morning and her nerves are bad, pt has a rx for ativan and she has only taken one this morning, please advise

## 2015-10-23 ENCOUNTER — Other Ambulatory Visit: Payer: Self-pay

## 2015-10-23 MED ORDER — HYDROCODONE-ACETAMINOPHEN 5-325 MG PO TABS: ORAL_TABLET | ORAL | Status: DC

## 2015-10-23 NOTE — Telephone Encounter (Signed)
Pt left v/m requesting rx hydrocodone apap. Call when ready for pick up. rx last printed # 60 on 07/25/15. Last annual 04/07/15.

## 2015-10-23 NOTE — Telephone Encounter (Signed)
Spoke with patient and advised rx ready for pick-up and it will be at the front desk.  

## 2015-10-30 ENCOUNTER — Other Ambulatory Visit: Payer: Self-pay | Admitting: Internal Medicine

## 2015-10-30 NOTE — Telephone Encounter (Signed)
09/17/2015 

## 2015-10-31 NOTE — Telephone Encounter (Signed)
Approved: #60 x 0 

## 2015-10-31 NOTE — Telephone Encounter (Signed)
rx called into pharmacy

## 2015-11-27 ENCOUNTER — Other Ambulatory Visit: Payer: Self-pay | Admitting: Family Medicine

## 2015-12-05 ENCOUNTER — Other Ambulatory Visit: Payer: Self-pay | Admitting: Internal Medicine

## 2015-12-05 NOTE — Telephone Encounter (Signed)
10/31/2015 

## 2015-12-06 NOTE — Telephone Encounter (Signed)
Approved: #60 x 0 

## 2015-12-08 NOTE — Telephone Encounter (Signed)
rx called into pharmacy

## 2015-12-24 ENCOUNTER — Encounter: Payer: Self-pay | Admitting: Internal Medicine

## 2015-12-24 ENCOUNTER — Ambulatory Visit (INDEPENDENT_AMBULATORY_CARE_PROVIDER_SITE_OTHER): Payer: BLUE CROSS/BLUE SHIELD | Admitting: Internal Medicine

## 2015-12-24 VITALS — BP 160/100 | HR 66 | Temp 98.0°F | Wt 126.0 lb

## 2015-12-24 DIAGNOSIS — M549 Dorsalgia, unspecified: Secondary | ICD-10-CM

## 2015-12-24 DIAGNOSIS — F331 Major depressive disorder, recurrent, moderate: Secondary | ICD-10-CM | POA: Diagnosis not present

## 2015-12-24 DIAGNOSIS — K219 Gastro-esophageal reflux disease without esophagitis: Secondary | ICD-10-CM | POA: Diagnosis not present

## 2015-12-24 DIAGNOSIS — G8929 Other chronic pain: Secondary | ICD-10-CM

## 2015-12-24 DIAGNOSIS — R634 Abnormal weight loss: Secondary | ICD-10-CM | POA: Diagnosis not present

## 2015-12-24 DIAGNOSIS — F3341 Major depressive disorder, recurrent, in partial remission: Secondary | ICD-10-CM | POA: Insufficient documentation

## 2015-12-24 MED ORDER — PANTOPRAZOLE SODIUM 40 MG PO TBEC: DELAYED_RELEASE_TABLET | ORAL | Status: DC

## 2015-12-24 MED ORDER — HYDROCODONE-ACETAMINOPHEN 5-325 MG PO TABS: ORAL_TABLET | ORAL | Status: DC

## 2015-12-24 MED ORDER — FLUOXETINE HCL 20 MG PO TABS: 20.0000 mg | ORAL_TABLET | Freq: Every day | ORAL | Status: DC

## 2015-12-24 NOTE — Assessment & Plan Note (Signed)
Probably worse with decreased activity Hydrocodone prn Try to get out and walk

## 2015-12-24 NOTE — Progress Notes (Signed)
Pre visit review using our clinic review tool, if applicable. No additional management support is needed unless otherwise documented below in the visit note. 

## 2015-12-24 NOTE — Assessment & Plan Note (Signed)
Pain and nausea are better on protonix---but ongoing weight loss Will still go ahead with GI referral if doesn't gain weight as depression remits with treatment

## 2015-12-24 NOTE — Assessment & Plan Note (Signed)
Has progressed Will need action if not improving with depression Rx

## 2015-12-24 NOTE — Assessment & Plan Note (Addendum)
Triggered by death of fiancee (and brings back memories of husband's death in Feb 24, 2011) Will restart fluoxetine Referred for bereavement counseling at [facility]

## 2015-12-24 NOTE — Patient Instructions (Signed)
Please call [facility] at [phone] and ask to speak to a bereavement counselor.

## 2015-12-24 NOTE — Progress Notes (Signed)
   Subjective:    Patient ID: Heather Russo, female    DOB: Mar 31, 1966, 50 y.o.   MRN: [id number]  HPI Still very upset---lost fiancee October 26th Daily depressed and crying Not eating great Has lost a lot of weight  "I can't deal with it" Lives alone Family is 20 minutes away--only sees daughter occasionally His best friend died 5 days before him--has spoken to his wife at times Also lost cousin within a week  Stomach trouble is a lot better  Sleep is "off and on" --not a lot Not working Gets out of bed and to couch Can't focus on anything Not thinking about dying and no suicidal ideation  Current Outpatient Prescriptions on File Prior to Visit  Medication Sig Dispense Refill  . fexofenadine (ALLEGRA) 180 MG tablet Take 180 mg by mouth daily as needed.      Marland Kitchen HYDROcodone-acetaminophen (NORCO/VICODIN) 5-325 MG tablet TAKE 1 TABLET BY MOUTH EVERY 4 HOURS AS NEEDED FOR PAIN 60 tablet 0  . LORazepam (ATIVAN) 1 MG tablet TAKE 1/2 TO 1 TABLET BY MOUTH TWICE DAILY AS NEEDED FOR ANXIETY 60 tablet 0  . pantoprazole (PROTONIX) 40 MG tablet TAKE 1 TABLET(40 MG) BY MOUTH DAILY 30 tablet 0   No current facility-administered medications on file prior to visit.    Allergies  Allergen Reactions  . Bupropion Hcl     REACTION: rash, nausea, bad taste in mouth, blurred vision  . Lisinopril     REACTION: cough,dizziness  . Mirtazapine     REACTION: unspecified    Past Medical History  Diagnosis Date  . Allergic rhinitis   . Depression   . GERD (gastroesophageal reflux disease)   . HTN (hypertension)   . Migraine   . HLD (hyperlipidemia)     mild  . Tremor, essential     Past Surgical History  Procedure Laterality Date  . Esophagogastroduodenoscopy  05/05/06  . Laparoscopic cholecystectomy  03/2008    Family History  Problem Relation Age of Onset  . Hypertension Father   . Heart attack Father   . Diabetes Father   . Hypertension Mother   . Hypertension Brother   .  Hypertension Brother   . Cancer Paternal Grandmother     Lung  . Tremor Mother   . Tremor Brother     Social History   Social History  . Marital Status: Widowed    Spouse Name: N/A  . Number of Children: 1  . Years of Education: N/A   Occupational History  .  Inventory clerk-- laid off 2015 [employer]        Social History Main Topics  . Smoking status: Current Every Day Smoker    Last Attempt to Quit: 07/23/2014  . Smokeless tobacco: Never Used     Comment: restarted after 1 month  . Alcohol Use: No  . Drug Use: No  . Sexual Activity: Not on file   Other Topics Concern  . Not on file   Social History Narrative   Widowed 10/12   Living with someone since 2014   Review of Systems Bowels are okay No nausea or vomiting Okay financially now    Objective:   Physical Exam  Psychiatric:  Tearful Severe depression Normal speech but constricted          Assessment & Plan:

## 2016-01-06 ENCOUNTER — Ambulatory Visit (INDEPENDENT_AMBULATORY_CARE_PROVIDER_SITE_OTHER): Payer: BLUE CROSS/BLUE SHIELD | Admitting: Internal Medicine

## 2016-01-06 ENCOUNTER — Encounter: Payer: Self-pay | Admitting: Internal Medicine

## 2016-01-06 VITALS — BP 120/80 | HR 59 | Temp 98.3°F | Wt 123.0 lb

## 2016-01-06 DIAGNOSIS — F331 Major depressive disorder, recurrent, moderate: Secondary | ICD-10-CM | POA: Diagnosis not present

## 2016-01-06 MED ORDER — VENLAFAXINE HCL ER 150 MG PO CP24: 150.0000 mg | ORAL_CAPSULE | Freq: Every day | ORAL | Status: DC

## 2016-01-06 MED ORDER — LORAZEPAM 1 MG PO TABS: 0.5000 mg | ORAL_TABLET | Freq: Two times a day (BID) | ORAL | Status: DC | PRN

## 2016-01-06 NOTE — Assessment & Plan Note (Signed)
No clear response from fluoxetine Will add venlafaxine for augmentation. Next step may be stimulant or antipsychotic. Must call the counselor--she promises to call today

## 2016-01-06 NOTE — Progress Notes (Signed)
Subjective:    Patient ID: Heather Russo, female    DOB: 04-29-1966, 50 y.o.   MRN: [id number]  HPI Here for follow up of depression  Doing about the same Taking the prozac but still no improvement---its been 2 weeks  Appetite is poor Weight down 3# more Gets hungry, will start eating--but then has to force it down  Didn't call for bereavement counselor Does think about dying at times--- doesn't want to die and hasn't considered suicide  Current Outpatient Prescriptions on File Prior to Visit  Medication Sig Dispense Refill  . aspirin 81 MG tablet Take 81 mg by mouth daily.    . fexofenadine (ALLEGRA) 180 MG tablet Take 180 mg by mouth daily as needed.      Marland Kitchen FLUoxetine (PROZAC) 20 MG tablet Take 1 tablet (20 mg total) by mouth daily. 30 tablet 3  . HYDROcodone-acetaminophen (NORCO/VICODIN) 5-325 MG tablet TAKE 1 TABLET BY MOUTH EVERY 4 HOURS AS NEEDED FOR PAIN 60 tablet 0  . LORazepam (ATIVAN) 1 MG tablet TAKE 1/2 TO 1 TABLET BY MOUTH TWICE DAILY AS NEEDED FOR ANXIETY 60 tablet 0  . Omega-3 Fatty Acids (FISH OIL) 1000 MG CAPS Take by mouth daily.    . pantoprazole (PROTONIX) 40 MG tablet TAKE 1 TABLET(40 MG) BY MOUTH DAILY 90 tablet 3  . propranolol ER (INDERAL LA) 80 MG 24 hr capsule TK 1 C PO QD  11   No current facility-administered medications on file prior to visit.    Allergies  Allergen Reactions  . Bupropion Hcl     REACTION: rash, nausea, bad taste in mouth, blurred vision  . Lisinopril     REACTION: cough,dizziness  . Mirtazapine     REACTION: unspecified    Past Medical History  Diagnosis Date  . Allergic rhinitis   . Depression   . GERD (gastroesophageal reflux disease)   . HTN (hypertension)   . Migraine   . HLD (hyperlipidemia)     mild  . Tremor, essential     Past Surgical History  Procedure Laterality Date  . Esophagogastroduodenoscopy  05/05/06  . Laparoscopic cholecystectomy  03/2008    Family History  Problem Relation Age of Onset  .  Hypertension Father   . Heart attack Father   . Diabetes Father   . Hypertension Mother   . Hypertension Brother   . Hypertension Brother   . Cancer Paternal Grandmother     Lung  . Tremor Mother   . Tremor Brother     Social History   Social History  . Marital Status: Widowed    Spouse Name: N/A  . Number of Children: 1  . Years of Education: N/A   Occupational History  .  Inventory clerk-- laid off 2015 [employer]        Social History Main Topics  . Smoking status: Current Every Day Smoker    Last Attempt to Quit: 07/23/2014  . Smokeless tobacco: Never Used     Comment: restarted after 1 month  . Alcohol Use: No  . Drug Use: No  . Sexual Activity: Not on file   Other Topics Concern  . Not on file   Social History Narrative   Widowed 10/12   Engaged with 2 year relationship--but he died 2023/09/27   Review of Systems Able to sleep a while with 2 of the lorazepam Limited time with grandchildren--but she really gets pleasure from this (but still thinking about him when with them)  Objective:   Physical Exam  Psychiatric:  Still very depressed and tearful Speech is low volume but appropriate No apparent thought process disturbance          Assessment & Plan:

## 2016-01-06 NOTE — Progress Notes (Signed)
Pre visit review using our clinic review tool, if applicable. No additional management support is needed unless otherwise documented below in the visit note. 

## 2016-01-06 NOTE — Patient Instructions (Signed)
Please call today to speak to a bereavement counselor--- [phone].

## 2016-01-07 ENCOUNTER — Ambulatory Visit: Payer: BLUE CROSS/BLUE SHIELD | Admitting: Internal Medicine

## 2016-01-20 ENCOUNTER — Ambulatory Visit (INDEPENDENT_AMBULATORY_CARE_PROVIDER_SITE_OTHER): Payer: BLUE CROSS/BLUE SHIELD | Admitting: Internal Medicine

## 2016-01-20 ENCOUNTER — Encounter: Payer: Self-pay | Admitting: Internal Medicine

## 2016-01-20 VITALS — BP 110/80 | HR 62 | Temp 98.2°F | Wt 123.0 lb

## 2016-01-20 DIAGNOSIS — F331 Major depressive disorder, recurrent, moderate: Secondary | ICD-10-CM | POA: Diagnosis not present

## 2016-01-20 NOTE — Progress Notes (Signed)
Subjective:    Patient ID: Heather Russo, female    DOB: 11-30-1965, 50 y.o.   MRN: [id number]  HPI Here for follow up of the depression She finally feels better  Not crying as much Did call the bereavement counselor--- had 1 phone and 1 in person session Thinks this helped but no plan for ongoing counseling  Sleep is not great but is better Appetite is not great--but able to keep things down Not thinking about dying  Current Outpatient Prescriptions on File Prior to Visit  Medication Sig Dispense Refill  . aspirin 81 MG tablet Take 81 mg by mouth daily.    . fexofenadine (ALLEGRA) 180 MG tablet Take 180 mg by mouth daily as needed.      Marland Kitchen FLUoxetine (PROZAC) 20 MG tablet Take 1 tablet (20 mg total) by mouth daily. 30 tablet 3  . HYDROcodone-acetaminophen (NORCO/VICODIN) 5-325 MG tablet TAKE 1 TABLET BY MOUTH EVERY 4 HOURS AS NEEDED FOR PAIN 60 tablet 0  . LORazepam (ATIVAN) 1 MG tablet Take 0.5-1 tablets (0.5-1 mg total) by mouth 2 (two) times daily as needed. for anxiety 60 tablet 0  . Omega-3 Fatty Acids (FISH OIL) 1000 MG CAPS Take by mouth daily.    . pantoprazole (PROTONIX) 40 MG tablet TAKE 1 TABLET(40 MG) BY MOUTH DAILY 90 tablet 3  . propranolol ER (INDERAL LA) 80 MG 24 hr capsule TK 1 C PO QD  11  . venlafaxine XR (EFFEXOR-XR) 150 MG 24 hr capsule Take 1 capsule (150 mg total) by mouth daily with breakfast. 30 capsule 3   No current facility-administered medications on file prior to visit.    Allergies  Allergen Reactions  . Bupropion Hcl     REACTION: rash, nausea, bad taste in mouth, blurred vision  . Lisinopril     REACTION: cough,dizziness  . Mirtazapine     REACTION: unspecified    Past Medical History  Diagnosis Date  . Allergic rhinitis   . Depression   . GERD (gastroesophageal reflux disease)   . HTN (hypertension)   . Migraine   . HLD (hyperlipidemia)     mild  . Tremor, essential     Past Surgical History  Procedure Laterality Date  .  Esophagogastroduodenoscopy  05/05/06  . Laparoscopic cholecystectomy  03/2008    Family History  Problem Relation Age of Onset  . Hypertension Father   . Heart attack Father   . Diabetes Father   . Hypertension Mother   . Hypertension Brother   . Hypertension Brother   . Cancer Paternal Grandmother     Lung  . Tremor Mother   . Tremor Brother     Social History   Social History  . Marital Status: Widowed    Spouse Name: N/A  . Number of Children: 1  . Years of Education: N/A   Occupational History  .  Inventory clerk-- laid off 2015 [employer]        Social History Main Topics  . Smoking status: Current Every Day Smoker    Last Attempt to Quit: 07/23/2014  . Smokeless tobacco: Never Used     Comment: restarted after 1 month  . Alcohol Use: No  . Drug Use: No  . Sexual Activity: Not on file   Other Topics Concern  . Not on file   Social History Narrative   Widowed 10/12   Engaged with 2 year relationship--but he died 2023/10/05   Review of Systems Weight has stabilized---  her baseline usually 140# No SOB, chest pain, dizziness Looking for work LMP 9/16---does have some mild flashes now (doesn't sound serious)    Objective:   Physical Exam  Psychiatric:  Fairly normal appearance Not depressed Affect is neutral          Assessment & Plan:

## 2016-01-20 NOTE — Assessment & Plan Note (Signed)
Finally has responded Had 2 good sessions with bereavement counselor Will continue dual therapy--increase venlafaxine

## 2016-01-20 NOTE — Patient Instructions (Signed)
Please call if you feel your mood is slipping--I will increase the venlafaxine.

## 2016-01-20 NOTE — Progress Notes (Signed)
Pre visit review using our clinic review tool, if applicable. No additional management support is needed unless otherwise documented below in the visit note. 

## 2016-02-10 ENCOUNTER — Other Ambulatory Visit: Payer: Self-pay | Admitting: Internal Medicine

## 2016-02-11 NOTE — Telephone Encounter (Signed)
Rx called in as directed.   

## 2016-02-11 NOTE — Telephone Encounter (Signed)
Last refill 01-06-16 #60 Last OV 01-20-16 Next OV 04-09-16

## 2016-02-11 NOTE — Telephone Encounter (Signed)
Approved: #60 x 0 

## 2016-02-17 ENCOUNTER — Encounter: Payer: Self-pay | Admitting: Internal Medicine

## 2016-02-17 ENCOUNTER — Ambulatory Visit (INDEPENDENT_AMBULATORY_CARE_PROVIDER_SITE_OTHER): Payer: BLUE CROSS/BLUE SHIELD | Admitting: Internal Medicine

## 2016-02-17 VITALS — BP 128/80 | HR 71 | Temp 98.0°F | Wt 125.0 lb

## 2016-02-17 DIAGNOSIS — F331 Major depressive disorder, recurrent, moderate: Secondary | ICD-10-CM | POA: Diagnosis not present

## 2016-02-17 DIAGNOSIS — J069 Acute upper respiratory infection, unspecified: Secondary | ICD-10-CM | POA: Diagnosis not present

## 2016-02-17 MED ORDER — PROPRANOLOL HCL ER 80 MG PO CP24: ORAL_CAPSULE | ORAL | Status: DC

## 2016-02-17 MED ORDER — FLUOXETINE HCL 20 MG PO TABS: 20.0000 mg | ORAL_TABLET | Freq: Every day | ORAL | Status: DC

## 2016-02-17 MED ORDER — VENLAFAXINE HCL ER 225 MG PO TB24: 1.0000 | ORAL_TABLET | Freq: Every day | ORAL | Status: DC

## 2016-02-17 MED ORDER — HYDROCODONE-ACETAMINOPHEN 5-325 MG PO TABS: ORAL_TABLET | ORAL | Status: DC

## 2016-02-17 NOTE — Progress Notes (Signed)
Subjective:    Patient ID: Heather Russo, female    DOB: [birth year], 50 y.o.   MRN: [id number]  HPI Here for follow up of depression  Doing okay Feels her mood is slipping some Some crying again Had been getting out for a while--now back spending most of her time at home No thoughts of dying or suicide Feels ready to try increase in the medication Still searching for a job---but no luck so far  Awoke yesterday with cough Sneezing also No fever No SOB  Current Outpatient Prescriptions on File Prior to Visit  Medication Sig Dispense Refill  . aspirin 81 MG tablet Take 81 mg by mouth daily.    . fexofenadine (ALLEGRA) 180 MG tablet Take 180 mg by mouth daily as needed.      Marland Kitchen FLUoxetine (PROZAC) 20 MG tablet Take 1 tablet (20 mg total) by mouth daily. 30 tablet 3  . HYDROcodone-acetaminophen (NORCO/VICODIN) 5-325 MG tablet TAKE 1 TABLET BY MOUTH EVERY 4 HOURS AS NEEDED FOR PAIN 60 tablet 0  . LORazepam (ATIVAN) 1 MG tablet TAKE 1/2 TO 1 TABLET BY MOUTH TWICE DAILY AS NEEDED FOR ANXIETY 60 tablet 0  . Omega-3 Fatty Acids (FISH OIL) 1000 MG CAPS Take by mouth daily.    . pantoprazole (PROTONIX) 40 MG tablet TAKE 1 TABLET(40 MG) BY MOUTH DAILY 90 tablet 3  . propranolol ER (INDERAL LA) 80 MG 24 hr capsule TK 1 C PO QD  11  . venlafaxine XR (EFFEXOR-XR) 150 MG 24 hr capsule Take 1 capsule (150 mg total) by mouth daily with breakfast. 30 capsule 3   No current facility-administered medications on file prior to visit.    Allergies  Allergen Reactions  . Bupropion Hcl     REACTION: rash, nausea, bad taste in mouth, blurred vision  . Lisinopril     REACTION: cough,dizziness  . Mirtazapine     REACTION: unspecified    Past Medical History  Diagnosis Date  . Allergic rhinitis   . Depression   . GERD (gastroesophageal reflux disease)   . HTN (hypertension)   . Migraine   . HLD (hyperlipidemia)     mild  . Tremor, essential     Past Surgical History  Procedure Laterality  Date  . Esophagogastroduodenoscopy  05/05/06  . Laparoscopic cholecystectomy  03/2008    Family History  Problem Relation Age of Onset  . Hypertension Father   . Heart attack Father   . Diabetes Father   . Hypertension Mother   . Hypertension Brother   . Hypertension Brother   . Cancer Paternal Grandmother     Lung  . Tremor Mother   . Tremor Brother     Social History   Social History  . Marital Status: Widowed    Spouse Name: N/A  . Number of Children: 1  . Years of Education: N/A   Occupational History  .  Inventory clerk-- laid off 2015 [employer]        Social History Main Topics  . Smoking status: Current Every Day Smoker    Last Attempt to Quit: 07/23/2014  . Smokeless tobacco: Never Used     Comment: restarted after 1 month  . Alcohol Use: No  . Drug Use: No  . Sexual Activity: Not on file   Other Topics Concern  . Not on file   Social History Narrative   Widowed 10/12   Engaged with 2 year relationship--but he died 09/08/23   Review  of Systems Appetite so-so Weight up 3# Sleep is fair most of the time    Objective:   Physical Exam  Constitutional: She appears well-developed and well-nourished. No distress.  HENT:  Mouth/Throat: Oropharynx is clear and moist. No oropharyngeal exudate.  Neck: Normal range of motion. Neck supple.  Pulmonary/Chest: Effort normal and breath sounds normal. No respiratory distress. She has no wheezes. She has no rales.  Lymphadenopathy:    She has no cervical adenopathy.          Assessment & Plan:

## 2016-02-17 NOTE — Assessment & Plan Note (Signed)
No evidence of secondary bacterial infection Discussed supportive care 

## 2016-02-17 NOTE — Progress Notes (Signed)
Pre visit review using our clinic review tool, if applicable. No additional management support is needed unless otherwise documented below in the visit note. 

## 2016-02-17 NOTE — Assessment & Plan Note (Signed)
Partial remission Will increase the venlafaxine

## 2016-03-22 ENCOUNTER — Other Ambulatory Visit: Payer: Self-pay | Admitting: Internal Medicine

## 2016-03-22 NOTE — Telephone Encounter (Signed)
Approved: #60 x 0 

## 2016-03-22 NOTE — Telephone Encounter (Signed)
Last filled 02-11-16 #60. Last OV 02-17-16 Next OV 04-09-16

## 2016-03-23 NOTE — Telephone Encounter (Signed)
Left refill on voice mail at pharmacy  

## 2016-04-09 ENCOUNTER — Other Ambulatory Visit: Payer: Self-pay

## 2016-04-09 ENCOUNTER — Encounter: Payer: Self-pay | Admitting: Internal Medicine

## 2016-04-09 MED ORDER — HYDROCODONE-ACETAMINOPHEN 5-325 MG PO TABS: ORAL_TABLET | ORAL | Status: DC

## 2016-04-09 NOTE — Telephone Encounter (Signed)
Pt left v/m requesting rx hydrocodone apap. Call when ready for pick up. Pt last seen and rx last printed # 60 on 02/17/16.

## 2016-04-09 NOTE — Telephone Encounter (Signed)
Spoke to pt. Rx up front for pickup 

## 2016-05-18 ENCOUNTER — Encounter: Payer: Self-pay | Admitting: Internal Medicine

## 2016-05-18 ENCOUNTER — Encounter: Payer: Self-pay | Admitting: Gastroenterology

## 2016-05-18 ENCOUNTER — Ambulatory Visit (INDEPENDENT_AMBULATORY_CARE_PROVIDER_SITE_OTHER): Payer: BLUE CROSS/BLUE SHIELD | Admitting: Internal Medicine

## 2016-05-18 VITALS — BP 124/90 | HR 73 | Temp 97.5°F | Wt 122.0 lb

## 2016-05-18 DIAGNOSIS — R1319 Other dysphagia: Secondary | ICD-10-CM | POA: Insufficient documentation

## 2016-05-18 DIAGNOSIS — R1314 Dysphagia, pharyngoesophageal phase: Secondary | ICD-10-CM

## 2016-05-18 DIAGNOSIS — R1310 Dysphagia, unspecified: Secondary | ICD-10-CM

## 2016-05-18 MED ORDER — LORAZEPAM 1 MG PO TABS
ORAL_TABLET | ORAL | Status: DC
Start: 2016-05-18 — End: 2016-08-02

## 2016-05-18 MED ORDER — HYDROCODONE-ACETAMINOPHEN 5-325 MG PO TABS: ORAL_TABLET | ORAL | Status: DC

## 2016-05-18 NOTE — Progress Notes (Signed)
Subjective:    Patient ID: Heather Russo, female    DOB: 1966-11-14, 50 y.o.   MRN: [id number]  HPI Here due to swallowing problems  Worsening lately Food and medications get stuck in upper esophagus and causes pain Noticeable over the past few months but really more noticeable Takes the protonix daily on empty stomach No sig or regular abdominal pain (gets episodic bilateral flank pain)  Some nausea when eating--stops and eventually eases up No vomiting Still smoking --but has cut down No mints 1 caffeine soft drink daily Eats in evening-- but not within 3-4 hours of bedtime  Mood is generally better Some bad days though--back to biting fingernails  Current Outpatient Prescriptions on File Prior to Visit  Medication Sig Dispense Refill  . aspirin 81 MG tablet Take 81 mg by mouth daily.    . fexofenadine (ALLEGRA) 180 MG tablet Take 180 mg by mouth daily as needed.      Marland Kitchen FLUoxetine (PROZAC) 20 MG tablet Take 1 tablet (20 mg total) by mouth daily. 90 tablet 3  . HYDROcodone-acetaminophen (NORCO/VICODIN) 5-325 MG tablet TAKE 1 TABLET BY MOUTH EVERY 4 HOURS AS NEEDED FOR PAIN 60 tablet 0  . LORazepam (ATIVAN) 1 MG tablet TAKE 1/2 TO 1 TABLET BY MOUTH TWICE DAILY AS NEEDED 60 tablet 0  . Omega-3 Fatty Acids (FISH OIL) 1000 MG CAPS Take by mouth daily.    . pantoprazole (PROTONIX) 40 MG tablet TAKE 1 TABLET(40 MG) BY MOUTH DAILY 90 tablet 3  . propranolol ER (INDERAL LA) 80 MG 24 hr capsule TK 1 C PO QD 90 capsule 3  . Venlafaxine HCl 225 MG TB24 Take 1 tablet (225 mg total) by mouth daily. 30 each 11   No current facility-administered medications on file prior to visit.    Allergies  Allergen Reactions  . Bupropion Hcl     REACTION: rash, nausea, bad taste in mouth, blurred vision  . Lisinopril     REACTION: cough,dizziness  . Mirtazapine     REACTION: unspecified    Past Medical History  Diagnosis Date  . Allergic rhinitis   . Depression   . GERD (gastroesophageal  reflux disease)   . HTN (hypertension)   . Migraine   . HLD (hyperlipidemia)     mild  . Tremor, essential     Past Surgical History  Procedure Laterality Date  . Esophagogastroduodenoscopy  05/05/06  . Laparoscopic cholecystectomy  03/2008    Family History  Problem Relation Age of Onset  . Hypertension Father   . Heart attack Father   . Diabetes Father   . Hypertension Mother   . Hypertension Brother   . Hypertension Brother   . Cancer Paternal Grandmother     Lung  . Tremor Mother   . Tremor Brother     Social History   Social History  . Marital Status: Widowed    Spouse Name: N/A  . Number of Children: 1  . Years of Education: N/A   Occupational History  .  Inventory clerk-- laid off 2015 [employer]        Social History Main Topics  . Smoking status: Current Every Day Smoker    Last Attempt to Quit: 07/23/2014  . Smokeless tobacco: Never Used     Comment: restarted after 1 month  . Alcohol Use: No  . Drug Use: No  . Sexual Activity: Not on file   Other Topics Concern  . Not on file  Social History Narrative   Widowed 10/12   Engaged with 2 year relationship--but he died 09-20-23   Review of Systems  Bowels go every 2-3 days ---her normal No blood in stools Appetite is not great Has lost a little more weight Still hasn't found any work    Objective:   Physical Exam  Constitutional: She appears well-developed and well-nourished. No distress.  Pulmonary/Chest: Effort normal and breath sounds normal. No respiratory distress. She has no wheezes. She has no rales.  Abdominal: Soft. She exhibits no distension. There is no tenderness. There is no rebound and no guarding.  Musculoskeletal: She exhibits no edema.  Psychiatric: She has a normal mood and affect. Her behavior is normal.          Assessment & Plan:

## 2016-05-18 NOTE — Progress Notes (Signed)
Pre visit review using our clinic review tool, if applicable. No additional management support is needed unless otherwise documented below in the visit note. 

## 2016-05-18 NOTE — Assessment & Plan Note (Signed)
Probably acid related Discussed cigarette cessation Will increase the protonix to bid GI referral to consider EGD

## 2016-05-28 ENCOUNTER — Encounter: Payer: Self-pay | Admitting: Gastroenterology

## 2016-05-28 ENCOUNTER — Ambulatory Visit (INDEPENDENT_AMBULATORY_CARE_PROVIDER_SITE_OTHER): Payer: BLUE CROSS/BLUE SHIELD | Admitting: Gastroenterology

## 2016-05-28 VITALS — BP 120/70 | HR 78 | Ht 65.0 in | Wt 120.0 lb

## 2016-05-28 DIAGNOSIS — R110 Nausea: Secondary | ICD-10-CM

## 2016-05-28 DIAGNOSIS — R6881 Early satiety: Secondary | ICD-10-CM

## 2016-05-28 DIAGNOSIS — R1310 Dysphagia, unspecified: Secondary | ICD-10-CM | POA: Diagnosis not present

## 2016-05-28 DIAGNOSIS — K219 Gastro-esophageal reflux disease without esophagitis: Secondary | ICD-10-CM | POA: Diagnosis not present

## 2016-05-28 DIAGNOSIS — Z1211 Encounter for screening for malignant neoplasm of colon: Secondary | ICD-10-CM | POA: Diagnosis not present

## 2016-05-28 DIAGNOSIS — R634 Abnormal weight loss: Secondary | ICD-10-CM | POA: Insufficient documentation

## 2016-05-28 MED ORDER — NA SULFATE-K SULFATE-MG SULF 17.5-3.13-1.6 GM/177ML PO SOLN: 1.0000 | Freq: Once | ORAL | Status: DC

## 2016-05-28 NOTE — Patient Instructions (Signed)
You have been scheduled for an endoscopy and colonoscopy. Please follow the written instructions given to you at your visit today. Please pick up your prep supplies at the pharmacy within the next 1-3 days. [facility], [place]  If you use inhalers (even only as needed), please bring them with you on the day of your procedure. Your physician has requested that you go to www.startemmi.com and enter the access code given to you at your visit today. This web site gives a general overview about your procedure. However, you should still follow specific instructions given to you by our office regarding your preparation for the procedure.

## 2016-05-28 NOTE — Progress Notes (Signed)
05/28/2016 Heather Russo [id number] 09-11-1966   HISTORY OF PRESENT ILLNESS:  This is a 50 year-old female who is known to Dr. Carlean Purl 10 years ago for EGD that was performed for complaints of dysphagia.  At that time she was found to have no stricture or esophageal abnormality but only a small sliding hiatal hernia. Heather Russo dilation was performed, however. She presents to our office today at the request of her PCP, Dr. Silvio Pate, with complaints of recurrent dysphagia to both solid food such as bread, french fries, etc. as well as to liquids at times as well. Food gets stuck in her chest.  She thinks it is similar to what she experienced in 2007. She says that the dilation back at that time did help her symptoms. She admits to acid reflux and had been on pantoprazole 40 mg daily but that was recently increased to twice a day. Has not noticed any further improvement in her symptoms since increasing the dose, however. Also admits to nausea, but no vomiting. Says that she gets a sensation that she is very hungry but then can only couple of bites before feeling full. She says that she has lost 35 pounds since all this began in October. She's never undergone colonoscopy in the past.   Past Medical History  Diagnosis Date  . Allergic rhinitis   . Depression   . GERD (gastroesophageal reflux disease)   . HTN (hypertension)   . Migraine   . HLD (hyperlipidemia)     mild  . Tremor, essential    Past Surgical History  Procedure Laterality Date  . Esophagogastroduodenoscopy  05/05/06  . Laparoscopic cholecystectomy  03/2008    reports that she has been smoking.  She has never used smokeless tobacco. She reports that she does not drink alcohol or use illicit drugs. family history includes Cancer in her paternal grandmother; Diabetes in her father; Heart attack in her father; Hypertension in her brother, brother, father, and mother; Tremor in her brother and mother. Allergies  Allergen  Reactions  . Bupropion Hcl     REACTION: rash, nausea, bad taste in mouth, blurred vision  . Lisinopril     REACTION: cough,dizziness  . Mirtazapine     REACTION: unspecified      Outpatient Encounter Prescriptions as of 05/28/2016  Medication Sig  . aspirin 81 MG tablet Take 81 mg by mouth daily.  . fexofenadine (ALLEGRA) 180 MG tablet Take 180 mg by mouth daily as needed.    Marland Kitchen FLUoxetine (PROZAC) 20 MG tablet Take 1 tablet (20 mg total) by mouth daily.  Marland Kitchen HYDROcodone-acetaminophen (NORCO/VICODIN) 5-325 MG tablet TAKE 1 TABLET BY MOUTH EVERY 4 HOURS AS NEEDED FOR PAIN  . LORazepam (ATIVAN) 1 MG tablet TAKE 1/2 TO 1 TABLET BY MOUTH TWICE DAILY AS NEEDED  . Omega-3 Fatty Acids (FISH OIL) 1000 MG CAPS Take by mouth daily.  . pantoprazole (PROTONIX) 40 MG tablet TAKE 1 TABLET(40 MG) BY MOUTH DAILY  . propranolol ER (INDERAL LA) 80 MG 24 hr capsule TK 1 C PO QD  . Venlafaxine HCl 225 MG TB24 Take 1 tablet (225 mg total) by mouth daily.  . Na Sulfate-K Sulfate-Mg Sulf 17.5-3.13-1.6 GM/180ML SOLN Take 1 kit by mouth once.   No facility-administered encounter medications on file as of 05/28/2016.     REVIEW OF SYSTEMS  : All other systems reviewed and negative except where noted in the History of Present Illness.   PHYSICAL EXAM: BP 120/70  mmHg  Pulse 78  Ht _0  (1.651 m)  Wt 120 lb (54.432 kg)  BMI 19.97 kg/m2  SpO2 98% General: Well developed white female in no acute distress Head: Normocephalic and atraumatic Eyes:  Sclerae anicteric, conjunctiva pink. Ears: Normal auditory acuity Lungs: Clear throughout to auscultation Heart: Regular rate and rhythm Abdomen: Soft, non-distended.  Normal bowel sounds.  Non-tender. Rectal:  Will be done at the time of colonoscopy. Musculoskeletal: Symmetrical with no gross deformities  Skin: No lesions on visible extremities Extremities: No edema  Neurological: Alert oriented x 4, grossly non-focal Psychological:  Alert and cooperative.  Normal mood and affect  ASSESSMENT AND PLAN: -50 year old female with complaints of dysphagia to solid and liquids, GERD, early satiety, nausea, and 35 pound weight loss.  Had similar complaints of dysphagia 10 years ago and underwent EGD with dilation that helped her symptoms at that time. -Screening colonoscopy  *Will schedule for EGD with possible dilation and colonoscopy.  The risks, benefits, and alternatives to EGD with dilation and colonoscopy were discussed with the patient and she consents to proceed.  *If these studies are unremarkable then may need CT scan chest/abdomen/pelvis for evaluation of weight loss. *Continue pantoprazole 40 mg BID for now.   CC:  Heather Carbon, MD

## 2016-05-31 ENCOUNTER — Encounter: Payer: Self-pay | Admitting: Internal Medicine

## 2016-06-07 ENCOUNTER — Ambulatory Visit (AMBULATORY_SURGERY_CENTER): Payer: BLUE CROSS/BLUE SHIELD | Admitting: Internal Medicine

## 2016-06-07 ENCOUNTER — Encounter: Payer: Self-pay | Admitting: Internal Medicine

## 2016-06-07 VITALS — BP 113/72 | HR 73 | Temp 98.2°F | Resp 17 | Ht 65.0 in | Wt 120.0 lb

## 2016-06-07 DIAGNOSIS — R1310 Dysphagia, unspecified: Secondary | ICD-10-CM

## 2016-06-07 DIAGNOSIS — K30 Functional dyspepsia: Secondary | ICD-10-CM

## 2016-06-07 DIAGNOSIS — Z1211 Encounter for screening for malignant neoplasm of colon: Secondary | ICD-10-CM

## 2016-06-07 MED ORDER — SODIUM CHLORIDE 0.9 % IV SOLN: 500.0000 mL | INTRAVENOUS | Status: DC

## 2016-06-07 MED ORDER — BUSPIRONE HCL 10 MG PO TABS: 10.0000 mg | ORAL_TABLET | Freq: Two times a day (BID) | ORAL | Status: DC

## 2016-06-07 NOTE — Progress Notes (Signed)
Called to room to assist during endoscopic procedure.  Patient ID and intended procedure confirmed with present staff. Received instructions for my participation in the procedure from the performing physician.  

## 2016-06-07 NOTE — Op Note (Signed)
[facility] Patient Name: Aaria Respess Procedure Date: 06/07/2016 2:12 PM MRN: [id number] Endoscopist: Gatha Mayer , MD Age: 50 Referring MD:  Date of Birth: 16-Feb-1966 Gender: Female Account #: [id number] Procedure:                Upper GI endoscopy Indications:              Dysphagia, Anorexia, Early satiety Medicines:                Propofol per Anesthesia, Monitored Anesthesia Care Procedure:                Pre-Anesthesia Assessment:                           - Prior to the procedure, a History and Physical                            was performed, and patient medications and                            allergies were reviewed. The patient's tolerance of                            previous anesthesia was also reviewed. The risks                            and benefits of the procedure and the sedation                            options and risks were discussed with the patient.                            All questions were answered, and informed consent                            was obtained. Prior Anticoagulants: The patient                            last took aspirin 1 day prior to the procedure. ASA                            Grade Assessment: II - A patient with mild systemic                            disease. After reviewing the risks and benefits,                            the patient was deemed in satisfactory condition to                            undergo the procedure.                           After obtaining informed consent, the endoscope was  passed under direct vision. Throughout the                            procedure, the patient's blood pressure, pulse, and                            oxygen saturations were monitored continuously. The                            Model GIF-[id number] scope was introduced                            through the mouth, and advanced to the second part                            of  duodenum. The upper GI endoscopy was                            accomplished without difficulty. The patient                            tolerated the procedure well. Scope In: Scope Out: Findings:                 The esophagus was normal.                           The exam of the stomach was otherwise normal.                           The examined duodenum was normal.                           The scope was withdrawn. Dilation was performed in                            the entire esophagus with a Maloney dilator with                            mild resistance at 54 Fr. Slight heme seen. Complications:            No immediate complications. Estimated Blood Loss:     Estimated blood loss was minimal. Impression:               - Normal esophagus.                           - Normal examined duodenum.                           - Dilation performed in the entire esophagus.                           - No specimens collected.                           - i  think she has severe FUNCTIONAL DYSPEPSIA Recommendation:           - Clear liquids x 1 hour then soft foods rest of                            day. Start prior diet tomorrow.                           - Continue present medications.                           - See the other procedure note for documentation of                            additional recommendations.                           - Buspirone 10 mg bid starying w/ 5 mg bid for                            first week Gatha Mayer, MD 06/07/2016 2:45:26 PM This report has been signed electronically.

## 2016-06-07 NOTE — Progress Notes (Signed)
Agree with Ms. Zehr's management.  Carl E. Gessner, MD, FACG  

## 2016-06-07 NOTE — Progress Notes (Signed)
Report to PACU, RN, vss, BBS= Clear.  

## 2016-06-07 NOTE — Patient Instructions (Addendum)
Things look ok on both exams - i did dilate the esophagus. SEE DILATION DIET HANDOUT GIVEN TO YOU.   Sometimes stress and emotional problems can lead to physical symptoms like you have.  One medication to help that is called buspirone - it relieves the feeling of filling up very fast when you eat. Minimal side effects usually. It usually helps within a 2 month period.   I have sent a prescription for you to try it and ask that you make an appointment to see me (call soon) in 2-3 months.  I appreciate the opportunity to care for you. Gatha Mayer, MD, [facility]  Repeat colonoscopy in 10 years.   YOU HAD AN ENDOSCOPIC PROCEDURE TODAY AT [facility]:   Refer to the procedure report that was given to you for any specific questions about what was found during the examination.  If the procedure report does not answer your questions, please call your gastroenterologist to clarify.  If you requested that your care partner not be given the details of your procedure findings, then the procedure report has been included in a sealed envelope for you to review at your convenience later.  YOU SHOULD EXPECT: Some feelings of bloating in the abdomen. Passage of more gas than usual.  Walking can help get rid of the air that was put into your GI tract during the procedure and reduce the bloating. If you had a lower endoscopy (such as a colonoscopy or flexible sigmoidoscopy) you may notice spotting of blood in your stool or on the toilet paper. If you underwent a bowel prep for your procedure, you may not have a normal bowel movement for a few days.  Please Note:  You might notice some irritation and congestion in your nose or some drainage.  This is from the oxygen used during your procedure.  There is no need for concern and it should clear up in a day or so.  SYMPTOMS TO REPORT IMMEDIATELY:   Following lower endoscopy (colonoscopy or flexible sigmoidoscopy):  Excessive amounts of  blood in the stool  Significant tenderness or worsening of abdominal pains  Swelling of the abdomen that is new, acute  Fever of 100F or higher   Following upper endoscopy (EGD)  Vomiting of blood or coffee ground material  New chest pain or pain under the shoulder blades  Painful or persistently difficult swallowing  New shortness of breath  Fever of 100F or higher  Black, tarry-looking stools  For urgent or emergent issues, a gastroenterologist can be reached at any hour by calling [phone].   DIET:  Nothing by mouth for 1 hour, then clear liquid diet for 1 hour, then soft diet rest of today, resume prior diet tomorrow. See dilation diet handout given.  Drink plenty of fluids but you should avoid alcoholic beverages for 24 hours.  ACTIVITY:  You should plan to take it easy for the rest of today and you should NOT DRIVE or use heavy machinery until tomorrow (because of the sedation medicines used during the test).    FOLLOW UP: Our staff will call the number listed on your records the next business day following your procedure to check on you and address any questions or concerns that you may have regarding the information given to you following your procedure. If we do not reach you, we will leave a message.  However, if you are feeling well and you are not experiencing any problems, there is no need  to return our call.  We will assume that you have returned to your regular daily activities without incident.  If any biopsies were taken you will be contacted by phone or by letter within the next 1-3 weeks.  Please call us at [phone] if you have not heard about the biopsies in 3 weeks.    SIGNATURES/CONFIDENTIALITY: You and/or your care partner have signed paperwork which will be entered into your electronic medical record.  These signatures attest to the fact that that the information above on your After Visit Summary has been reviewed and is understood.  Full  responsibility of the confidentiality of this discharge information lies with you and/or your care-partner.

## 2016-06-07 NOTE — Op Note (Signed)
[facility] Patient Name: Heather Russo Procedure Date: 06/07/2016 2:11 PM MRN: [id number] Endoscopist: Gatha Mayer , MD Age: 50 Referring MD:  Date of Birth: Sep 03, 1966 Gender: Female Account #: [id number] Procedure:                Colonoscopy Indications:              Screening for colorectal malignant neoplasm Medicines:                Propofol per Anesthesia, Monitored Anesthesia Care Procedure:                Pre-Anesthesia Assessment:                           - Prior to the procedure, a History and Physical                            was performed, and patient medications and                            allergies were reviewed. The patient's tolerance of                            previous anesthesia was also reviewed. The risks                            and benefits of the procedure and the sedation                            options and risks were discussed with the patient.                            All questions were answered, and informed consent                            was obtained. Prior Anticoagulants: The patient                            last took aspirin 1 day prior to the procedure. ASA                            Grade Assessment: II - A patient with mild systemic                            disease. After reviewing the risks and benefits,                            the patient was deemed in satisfactory condition to                            undergo the procedure.                           After obtaining informed consent, the colonoscope  was passed under direct vision. Throughout the                            procedure, the patient's blood pressure, pulse, and                            oxygen saturations were monitored continuously. The                            Model CF-[id number]) scope was introduced                            through the anus and advanced to the the cecum,   identified by appendiceal orifice and ileocecal                            valve. The quality of the bowel preparation was                            good. The colonoscopy was performed without                            difficulty. The patient tolerated the procedure                            well. The bowel preparation used was Miralax. The                            ileocecal valve, appendiceal orifice, and rectum                            were photographed. Scope In: 2:22:14 PM Scope Out: 2:33:04 PM Total Procedure Duration: 0 hours 10 minutes 50 seconds  Findings:                 The perianal and digital rectal examinations were                            normal.                           The colon (entire examined portion) appeared normal. Complications:            No immediate complications. Estimated blood loss:                            None. Estimated Blood Loss:     Estimated blood loss: none. Recommendation:           - Repeat colonoscopy in 10 years for screening                            purposes.                           - Patient has a contact number available for  emergencies. The signs and symptoms of potential                            delayed complications were discussed with the                            patient. Return to normal activities tomorrow.                            Written discharge instructions were provided to the                            patient.                           - Resume previous diet.                           - Continue present medications. Gatha Mayer, MD 06/07/2016 2:46:51 PM This report has been signed electronically.

## 2016-06-08 ENCOUNTER — Telehealth: Payer: Self-pay

## 2016-06-08 NOTE — Telephone Encounter (Signed)
  Follow up Call-  Call back number 06/07/2016  Post procedure Call Back phone  # [phone] cell  Permission to leave phone message Yes    Patient was called for follow up after her procedure on 06/07/2016. No answer at the number given for follow up phone call. A message was left on the answering machine.

## 2016-06-09 ENCOUNTER — Encounter: Payer: BLUE CROSS/BLUE SHIELD | Admitting: Internal Medicine

## 2016-06-09 DIAGNOSIS — Z0289 Encounter for other administrative examinations: Secondary | ICD-10-CM

## 2016-06-18 ENCOUNTER — Other Ambulatory Visit: Payer: Self-pay

## 2016-06-18 MED ORDER — HYDROCODONE-ACETAMINOPHEN 5-325 MG PO TABS: ORAL_TABLET | ORAL | 0 refills | Status: DC

## 2016-06-18 NOTE — Telephone Encounter (Signed)
Pt left v/m requesting rx hydrocodone apap. Call when ready for pick up. Last printed # 60 on 05/18/16. Last f/u appt on 02/17/16. Dr Silvio Pate out of office.Please advise.

## 2016-06-18 NOTE — Telephone Encounter (Signed)
Pt left v/m requesting cb with status of vicodin rx.

## 2016-06-18 NOTE — Telephone Encounter (Signed)
Left message for Heather Russo that prescription is ready to be picked up at the front desk.

## 2016-06-18 NOTE — Telephone Encounter (Signed)
PLEASE NOTE: All timestamps contained within this report are represented as [place] Standard Time. CONFIDENTIALTY NOTICE: This fax transmission is intended only for the addressee. It contains information that is legally privileged, confidential or otherwise protected from use or disclosure. If you are not the intended recipient, you are strictly prohibited from reviewing, disclosing, copying using or disseminating any of this information or taking any action in reliance on or regarding this information. If you have received this fax in error, please notify us immediately by telephone so that we can arrange for its return to [place]. Phone: [phone], Toll-Free: [phone], Fax: [phone] Page: 1 of 1 Call Id: [id number] [place] [facility] - Client Nonclinical Telephone Record [facility] - Client Client Site [facility] Physician Viviana Simpler - MD Contact Type Call Who Is Calling Patient / Member / Family / Caregiver [name] Name Jiana Poppert 1966/04/07 [place] Phone Number [phone] Patient Name Heather Russo [birth year] Call Type Message Only Information Provided Reason for Call Request for General Office Information Initial Comment [name] states she needs her vicodan refilled. Additional Comment Call Closed By: Chilton [place] Transaction Date/Time: 06/17/2016 5:10:17 PM (ET)

## 2016-06-21 ENCOUNTER — Ambulatory Visit (INDEPENDENT_AMBULATORY_CARE_PROVIDER_SITE_OTHER): Payer: BLUE CROSS/BLUE SHIELD | Admitting: Primary Care

## 2016-06-21 ENCOUNTER — Encounter: Payer: Self-pay | Admitting: Primary Care

## 2016-06-21 VITALS — BP 118/68 | HR 72 | Temp 97.6°F | Ht 65.0 in | Wt 116.8 lb

## 2016-06-21 DIAGNOSIS — D0472 Carcinoma in situ of skin of left lower limb, including hip: Secondary | ICD-10-CM | POA: Diagnosis not present

## 2016-06-21 DIAGNOSIS — D229 Melanocytic nevi, unspecified: Secondary | ICD-10-CM

## 2016-06-21 NOTE — Progress Notes (Addendum)
Subjective:    Patient ID: Heather Russo, female    DOB: 1966/03/06, 50 y.o.   MRN: [id number]  HPI  Heather Russo is a 50 year old female who presents today requesting evaluation of a nevus. The nevus is located to the left anterior upper portion of her lower extremity. The nevus has been present for numerous years. The nevus was smooth, brown, and flat until just recently. Over the past 2 weeks she's noticed increase in size, changes in skin texture, and discoloration. She is a current cigarette smoker for the past 8 years. She wears sun screen when outdoors.   Review of Systems  Constitutional: Negative for fatigue and unexpected weight change.  Respiratory: Negative for cough.   Gastrointestinal: Negative for blood in stool.  Skin: Positive for color change. Negative for rash.       Nevus with changes.       Past Medical History:  Diagnosis Date  . Allergic rhinitis   . Allergy   . Anxiety   . Depression   . GERD (gastroesophageal reflux disease)   . HLD (hyperlipidemia)    mild  . HTN (hypertension)   . Migraine   . Tremor, essential      Social History   Social History  . Marital status: Widowed    Spouse name: N/A  . Number of children: 1  . Years of education: N/A   Occupational History  .  Inventory clerk-- laid off 2015 [employer]        Social History Main Topics  . Smoking status: Current Every Day Smoker    Packs/day: 0.50    Types: Cigarettes    Last attempt to quit: 07/23/2014  . Smokeless tobacco: Never Used     Comment: restarted after 1 month  . Alcohol use 0.0 oz/week     Comment: rare  . Drug use: No  . Sexual activity: Not on file   Other Topics Concern  . Not on file   Social History Narrative   Widowed 10/12   Engaged with 2 year relationship--but he died October 05, 2023    Past Surgical History:  Procedure Laterality Date  . ESOPHAGOGASTRODUODENOSCOPY  05/05/06  . LAPAROSCOPIC CHOLECYSTECTOMY  03/2008  . wisdom teeth  removeal      Family History  Problem Relation Age of Onset  . Hypertension Father   . Heart attack Father   . Diabetes Father   . Hypertension Mother   . Tremor Mother   . Hypertension Brother   . Hypertension Brother   . Cancer Paternal Grandmother     Lung  . Tremor Brother   . Colon cancer Neg Hx   . Esophageal cancer Neg Hx   . Pancreatic cancer Neg Hx   . Rectal cancer Neg Hx   . Stomach cancer Neg Hx     Allergies  Allergen Reactions  . Bupropion Hcl     REACTION: rash, nausea, bad taste in mouth, blurred vision  . Lisinopril     REACTION: cough,dizziness  . Mirtazapine     REACTION: unspecified    Current Outpatient Prescriptions on File Prior to Visit  Medication Sig Dispense Refill  . aspirin 81 MG tablet Take 81 mg by mouth daily.    . busPIRone (BUSPAR) 10 MG tablet Take 1 tablet (10 mg total) by mouth 2 (two) times daily. Start with 1/2 tablet bid for first week only 60 tablet 2  . fexofenadine (ALLEGRA) 180 MG tablet Take 180  mg by mouth daily as needed.      Marland Kitchen FLUoxetine (PROZAC) 20 MG tablet Take 1 tablet (20 mg total) by mouth daily. 90 tablet 3  . LORazepam (ATIVAN) 1 MG tablet TAKE 1/2 TO 1 TABLET BY MOUTH TWICE DAILY AS NEEDED 60 tablet 0  . Omega-3 Fatty Acids (FISH OIL) 1000 MG CAPS Take by mouth daily.    . pantoprazole (PROTONIX) 40 MG tablet TAKE 1 TABLET(40 MG) BY MOUTH DAILY 90 tablet 3  . propranolol ER (INDERAL LA) 80 MG 24 hr capsule TK 1 C PO QD 90 capsule 3  . Venlafaxine HCl 225 MG TB24 Take 1 tablet (225 mg total) by mouth daily. 30 each 11  . HYDROcodone-acetaminophen (NORCO/VICODIN) 5-325 MG tablet TAKE 1 TABLET BY MOUTH EVERY 4 HOURS AS NEEDED FOR PAIN (Patient not taking: Reported on 06/21/2016) 60 tablet 0   No current facility-administered medications on file prior to visit.     BP 118/68 (BP Location: Left Arm, Patient Position: Sitting, Cuff Size: Normal)   Pulse 72   Temp 97.6 F (36.4 C) (Oral)   Ht 5\' 5"  (1.651 m)   Wt  116 lb 12.8 oz (53 kg)   LMP 07/24/2015   SpO2 96%   BMI 19.44 kg/m    Objective:   Physical Exam  Neck: Neck supple.  Cardiovascular: Normal rate and regular rhythm.   Pulmonary/Chest: Effort normal and breath sounds normal.  Skin: Skin is warm and dry.  Suspicious appearing nevus that is one half centimeter in diameter to upper portion of left anterior lower extremity. Discoloration and raised nevus.          Assessment & Plan:  Abnormal nevus:  2 left lower extremity for years, changes in size/color/shape within the last 2 weeks. Appear suspicious for basal cell carcinoma. Shave biopsy performed today in clinic, specimen sent off for further evaluation. Discussed importance of smoking cessation and regular use of sunscreen outdoors. Will await pathology results.  Shave biopsy procedure:  Consent obtained, signed, and witnessed by CMA. Site cleansed appropriately with Betadine solution. 2% lidocaine with epi injected around site for anesthesia. 1/2 cm nevus shaved using a derma blade. Cauterization to site in order to stop bleeding which was successful. Wound cleansed, application of antibiotic ointment applied, bandage applied. Home care instructions provided. Procedure assisted by another provider in the office.  Sheral Flow, NP

## 2016-06-21 NOTE — Progress Notes (Signed)
Pre visit review using our clinic review tool, if applicable. No additional management support is needed unless otherwise documented below in the visit note. 

## 2016-06-21 NOTE — Patient Instructions (Signed)
Apply antibiotic ointment once daily for the next 3-4 days. Cover with a bandaid.  I will notify you of the results of your specimen once received.  It was a pleasure meeting you!

## 2016-06-23 ENCOUNTER — Telehealth: Payer: Self-pay | Admitting: Primary Care

## 2016-06-23 NOTE — Telephone Encounter (Signed)
Please notify patient that this may take several days to one week to result, and I will notify her as soon as I receive these results.

## 2016-06-23 NOTE — Telephone Encounter (Signed)
Spoken and notified patient of Kate's comments. Patient verbalized understanding. 

## 2016-06-23 NOTE — Telephone Encounter (Signed)
Patient called and asked to be notified when her pathology results are received.

## 2016-06-24 ENCOUNTER — Telehealth: Payer: Self-pay | Admitting: Primary Care

## 2016-06-24 ENCOUNTER — Other Ambulatory Visit: Payer: Self-pay | Admitting: Primary Care

## 2016-06-24 ENCOUNTER — Encounter: Payer: Self-pay | Admitting: Primary Care

## 2016-06-24 DIAGNOSIS — D0472 Carcinoma in situ of skin of left lower limb, including hip: Secondary | ICD-10-CM

## 2016-06-24 NOTE — Telephone Encounter (Signed)
Patient returned Chan's call. °

## 2016-06-25 NOTE — Telephone Encounter (Signed)
Spoken and notified patient of Kate's comments in results note. Patient verbalized understanding.

## 2016-07-02 ENCOUNTER — Ambulatory Visit (INDEPENDENT_AMBULATORY_CARE_PROVIDER_SITE_OTHER): Payer: BLUE CROSS/BLUE SHIELD | Admitting: Internal Medicine

## 2016-07-02 ENCOUNTER — Encounter: Payer: Self-pay | Admitting: Internal Medicine

## 2016-07-02 VITALS — BP 116/88 | HR 72 | Temp 97.9°F | Ht 61.5 in | Wt 117.0 lb

## 2016-07-02 DIAGNOSIS — F331 Major depressive disorder, recurrent, moderate: Secondary | ICD-10-CM | POA: Diagnosis not present

## 2016-07-02 DIAGNOSIS — R1310 Dysphagia, unspecified: Secondary | ICD-10-CM

## 2016-07-02 DIAGNOSIS — R1314 Dysphagia, pharyngoesophageal phase: Secondary | ICD-10-CM | POA: Diagnosis not present

## 2016-07-02 DIAGNOSIS — Z0000 Encounter for general adult medical examination without abnormal findings: Secondary | ICD-10-CM | POA: Diagnosis not present

## 2016-07-02 DIAGNOSIS — I10 Essential (primary) hypertension: Secondary | ICD-10-CM

## 2016-07-02 DIAGNOSIS — R1319 Other dysphagia: Secondary | ICD-10-CM

## 2016-07-02 LAB — COMPREHENSIVE METABOLIC PANEL
ALT: 19 U/L (ref 0–35)
AST: 20 U/L (ref 0–37)
Albumin: 4.3 g/dL (ref 3.5–5.2)
Alkaline Phosphatase: 83 U/L (ref 39–117)
BUN: 13 mg/dL (ref 6–23)
CO2: 31 mEq/L (ref 19–32)
Calcium: 9.9 mg/dL (ref 8.4–10.5)
Chloride: 103 mEq/L (ref 96–112)
Creatinine, Ser: 0.83 mg/dL (ref 0.40–1.20)
GFR: 77.11 mL/min (ref >60.00)
Glucose, Bld: 107 mg/dL — ABNORMAL HIGH (ref 70–99)
Potassium: 4.7 mEq/L (ref 3.5–5.1)
Sodium: 139 mEq/L (ref 135–145)
Total Bilirubin: 0.4 mg/dL (ref 0.2–1.2)
Total Protein: 7.3 g/dL (ref 6.0–8.3)

## 2016-07-02 LAB — CBC WITH DIFFERENTIAL/PLATELET
Basophils Absolute: 0.1 10*3/uL (ref 0.0–0.1)
Basophils Relative: 0.5 % (ref 0.0–3.0)
Eosinophils Absolute: 0.1 10*3/uL (ref 0.0–0.7)
Eosinophils Relative: 1.2 % (ref 0.0–5.0)
HCT: 49 % — ABNORMAL HIGH (ref 36.0–46.0)
Hemoglobin: 16.6 g/dL — ABNORMAL HIGH (ref 12.0–15.0)
Lymphocytes Relative: 30.7 % (ref 12.0–46.0)
Lymphs Abs: 3.2 10*3/uL (ref 0.7–4.0)
MCHC: 33.8 g/dL (ref 30.0–36.0)
MCV: 88.6 fl (ref 78.0–100.0)
Monocytes Absolute: 0.7 10*3/uL (ref 0.1–1.0)
Monocytes Relative: 6.9 % (ref 3.0–12.0)
Neutro Abs: 6.3 10*3/uL (ref 1.4–7.7)
Neutrophils Relative %: 60.7 % (ref 43.0–77.0)
Platelets: 341 10*3/uL (ref 150.0–400.0)
RBC: 5.54 Mil/uL — ABNORMAL HIGH (ref 3.87–5.11)
RDW: 13.6 % (ref 11.5–15.5)
WBC: 10.4 10*3/uL (ref 4.0–10.5)

## 2016-07-02 LAB — T4, FREE: Free T4: 0.78 ng/dL (ref 0.60–1.60)

## 2016-07-02 NOTE — Assessment & Plan Note (Signed)
Better after dilation Will try off buspar--she doesn't notice it helping

## 2016-07-02 NOTE — Assessment & Plan Note (Signed)
BP Readings from Last 3 Encounters:  07/02/16 116/88  06/21/16 118/68  06/07/16 113/72   Good control on propranolol (for tremor also)

## 2016-07-02 NOTE — Patient Instructions (Signed)
It would be okay to try off the buspirone. If you don't notice any change, you can stay off it.  Please set up your screening mammogram.

## 2016-07-02 NOTE — Progress Notes (Signed)
Pre visit review using our clinic review tool, if applicable. No additional management support is needed unless otherwise documented below in the visit note. 

## 2016-07-02 NOTE — Assessment & Plan Note (Signed)
Discussed cigarette cessation--is smoking less Just had colon Due for mammogram Pap due next year Recommended yearly flu shot Regular exercise--has been walking more

## 2016-07-02 NOTE — Progress Notes (Signed)
Subjective:    Patient ID: Heather Russo, female    DOB: 03-04-1966, 50 y.o.   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 aspirin 81 MG tablet Take 81 mg by mouth daily.    . busPIRone (BUSPAR) 10 MG tablet Take 1 tablet (10 mg total) by mouth 2 (two) times daily. Start with 1/2 tablet bid for first week only 60 tablet 2  . fexofenadine (ALLEGRA) 180 MG tablet Take 180 mg by mouth daily as needed.      Marland Kitchen FLUoxetine (PROZAC) 20 MG tablet Take 1 tablet (20 mg total) by mouth daily. 90 tablet 3  . HYDROcodone-acetaminophen (NORCO/VICODIN) 5-325 MG tablet TAKE 1 TABLET BY MOUTH EVERY 4 HOURS AS NEEDED FOR PAIN 60 tablet 0  . LORazepam (ATIVAN) 1 MG tablet TAKE 1/2 TO 1 TABLET BY MOUTH TWICE DAILY AS NEEDED 60 tablet 0  . Omega-3 Fatty Acids (FISH OIL) 1000 MG CAPS Take by mouth daily.    . pantoprazole (PROTONIX) 40 MG tablet TAKE 1 TABLET(40 MG) BY MOUTH DAILY 90 tablet 3  . propranolol ER (INDERAL LA) 80 MG 24 hr capsule TK 1 C PO QD 90 capsule 3  . Venlafaxine HCl 225 MG TB24 Take 1 tablet (225 mg total) by mouth daily. 30 each 11   No current facility-administered medications on file prior to visit.     Allergies  Allergen Reactions  . Bupropion Hcl     REACTION: rash, nausea, bad taste in mouth, blurred vision  . Lisinopril     REACTION: cough,dizziness  . Mirtazapine     REACTION:  unspecified    Past Medical History:  Diagnosis Date  . Allergic rhinitis   . Allergy   . Anxiety   . Depression   . GERD (gastroesophageal reflux disease)   . HLD (hyperlipidemia)    mild  . HTN (hypertension)   . Migraine   . 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 Tremor, essential     Past Surgical History:  Procedure Laterality Date  . ESOPHAGOGASTRODUODENOSCOPY  05/05/06  . LAPAROSCOPIC CHOLECYSTECTOMY  03/2008  . wisdom teeth removeal      Family History  Problem Relation Age of Onset  . Hypertension Father   . Heart attack Father   . Diabetes Father   . Hypertension Mother   . Tremor Mother   . Hypertension Brother   . Diabetes Brother   . Hypertension Brother   . Cancer Paternal Grandmother     Lung  . Tremor Brother   . Colon cancer Neg Hx   . Esophageal cancer Neg Hx   . Pancreatic cancer Neg Hx   . Rectal cancer Neg Hx   . Stomach cancer Neg Hx     Social History   Social History  . Marital status: Widowed  Spouse name: N/A  . Number of children: 1  . Years of education: N/A   Occupational History  .  Inventory clerk-- laid off 2015         Social History Main Topics  . Smoking status: Current Every Day Smoker    Packs/day: 0.50    Types: Cigarettes    Last attempt to quit: 07/23/2014  . Smokeless tobacco: Never Used     Comment: restarted after 1 month  . Alcohol use 0.0 oz/week     Comment: rare  . Drug use: No  . Sexual activity: Not on file   Other Topics Concern  . Not on file   Social History Narrative   Widowed 10/12   Engaged with 2 year relationship--but he died 09-09-2023   Review of Systems  Constitutional: Negative for unexpected weight change.       Wears seat belt  HENT: Positive for tinnitus. Negative for dental problem and hearing loss.        Keeps up with dentist  Eyes: Negative for visual disturbance.       No diplopia or unilateral vision loss  Respiratory: Positive for cough. Negative  for shortness of breath.   Cardiovascular: Negative for leg swelling.       Gets chest pain and racing heart with stress  Gastrointestinal: Negative for blood in stool and constipation.  Endocrine: Positive for polydipsia. Negative for polyuria.  Genitourinary: Negative for difficulty urinating, dysuria, hematuria and urgency.       LMP almost a year ago  Musculoskeletal: Positive for arthralgias, back pain and neck pain.  Skin:       New SCC  Allergic/Immunologic: Positive for environmental allergies. Negative for immunocompromised state.       Okay with fexofenadine  Neurological: Positive for headaches. Negative for dizziness, syncope and light-headedness.  Hematological: Bruises/bleeds easily.       ??axillary nodes  Psychiatric/Behavioral: Positive for dysphoric mood and sleep disturbance. The patient is nervous/anxious.        Objective:   Physical Exam  Constitutional: She is oriented to person, place, and time. She appears well-developed and well-nourished. No distress.  HENT:  Head: Normocephalic and atraumatic.  Right Ear: External ear normal.  Left Ear: External ear normal.  Mouth/Throat: Oropharynx is clear and moist. No oropharyngeal exudate.  Eyes: Conjunctivae are normal. Pupils are equal, round, and reactive to light.  Neck: Normal range of motion. Neck supple. No thyromegaly present.  Cardiovascular: Normal rate, regular rhythm, normal heart sounds and intact distal pulses.  Exam reveals no gallop.   No murmur heard. Pulmonary/Chest: Effort normal and breath sounds normal. No respiratory distress. She has no wheezes. She has no rales.  Abdominal: Soft. There is no tenderness.  Musculoskeletal: She exhibits no edema or tenderness.  Lymphadenopathy:    She has no cervical adenopathy.  Neurological: She is alert and oriented to person, place, and time.  Skin:  Shoulder lesion was SCC also--just got called from derm  Psychiatric:  Somewhat flat but not overly  depressed           Assessment & Plan:

## 2016-07-02 NOTE — Assessment & Plan Note (Signed)
Mildly worse with recent stressors No med change for now though

## 2016-08-02 ENCOUNTER — Other Ambulatory Visit: Payer: Self-pay

## 2016-08-02 NOTE — Telephone Encounter (Signed)
Pt left v/m requesting rx hydrocodone apap(last printed # 60 on 06/18/16) and lorazepam (last printed # 60 on 05/18/16). Pt last annual 07/02/16. Call when ready for pick up.

## 2016-08-03 MED ORDER — LORAZEPAM 1 MG PO TABS: ORAL_TABLET | ORAL | 0 refills | Status: DC

## 2016-08-03 MED ORDER — HYDROCODONE-ACETAMINOPHEN 5-325 MG PO TABS: ORAL_TABLET | ORAL | 0 refills | Status: DC

## 2016-08-03 NOTE — Telephone Encounter (Signed)
Spoke to pt. Rxs up front ready for pickup. I have enclosed the Advanced Authorization letter with the rxs.

## 2016-08-05 ENCOUNTER — Telehealth: Payer: Self-pay | Admitting: Internal Medicine

## 2016-08-05 NOTE — Telephone Encounter (Signed)
Patient Name: Heather Russo  DOB: [birth year]    Initial Comment [name] states has passed out a few times in the last 3 weeks    Nurse Assessment  Nurse: Christel Mormon, RN, Levada Dy Date/Time (Eastern Time): 08/05/2016 2:59:27 PM  Confirm and document reason for call. If symptomatic, describe symptoms. You must click the next button to save text entered. ---[name] has passed out 3x in the past 3 wks. The last time was day before yesterday. She did not go in for evaluation. She said the first time she was at a festival- they checked her BP and BS- BP was fine and BS was 100. The last office visit, her BS was a little elevated and instructed to watch what she ate or drank.  Has the patient traveled out of the country within the last 30 days? ---No  Does the patient have any new or worsening symptoms? ---Yes  Will a triage be completed? ---Yes  Related visit to physician within the last 2 weeks? ---No  Does the PT have any chronic conditions? (i.e. diabetes, asthma, etc.) ---Yes  List chronic conditions. ---HTN, tremors  Is the patient pregnant or possibly pregnant? (Ask all females between the ages of 13-55) ---No  Is this a behavioral health or substance abuse call? ---No     Guidelines    Guideline Title Affirmed Question Affirmed Notes       Final Disposition User        Comments  Called office and got verbal approval for downgrade to appt tomorrow. No current symptoms.   Referrals  [facility]

## 2016-08-05 NOTE — Telephone Encounter (Signed)
Sounds okay to wait till tomorrow as long as no chest pain or SOB

## 2016-08-05 NOTE — Telephone Encounter (Signed)
Pt has appt with Dr Silvio Pate 08/06/16 at 10:15.

## 2016-08-06 ENCOUNTER — Encounter: Payer: Self-pay | Admitting: Internal Medicine

## 2016-08-06 ENCOUNTER — Ambulatory Visit (INDEPENDENT_AMBULATORY_CARE_PROVIDER_SITE_OTHER): Payer: BLUE CROSS/BLUE SHIELD | Admitting: Internal Medicine

## 2016-08-06 DIAGNOSIS — R55 Syncope and collapse: Secondary | ICD-10-CM

## 2016-08-06 NOTE — Progress Notes (Signed)
Subjective:    Patient ID: Heather Russo, female    DOB: [birth year], 50 y.o.   MRN: [id number]  HPI Here due to several episodes of "passing out"  First one was at outdoor festival Evening and it was hot Sitting there listening to music Broke out in sweat, head started hurting--then was on the ground BP good, sugar 100 (checked by medical staff)  2 more similar episodes--while at home over the past 3 weeks Gets sweaty, headache--then down on ground No other warning signs  Always when standing People do notice some staggering soon after she first gets up  Has a lot on her mind Daughter just fell and broke foot--her husband had tumor on brain (probably not cancer). Neither working now and they have 2 kids  Current Outpatient Prescriptions on File Prior to Visit  Medication Sig Dispense Refill  . aspirin 81 MG tablet Take 81 mg by mouth daily.    . busPIRone (BUSPAR) 10 MG tablet Take 1 tablet (10 mg total) by mouth 2 (two) times daily. Start with 1/2 tablet bid for first week only 60 tablet 2  . fexofenadine (ALLEGRA) 180 MG tablet Take 180 mg by mouth daily as needed.      Marland Kitchen FLUoxetine (PROZAC) 20 MG tablet Take 1 tablet (20 mg total) by mouth daily. 90 tablet 3  . HYDROcodone-acetaminophen (NORCO/VICODIN) 5-325 MG tablet TAKE 1 TABLET BY MOUTH EVERY 4 HOURS AS NEEDED FOR PAIN 60 tablet 0  . LORazepam (ATIVAN) 1 MG tablet TAKE 1/2 TO 1 TABLET BY MOUTH TWICE DAILY AS NEEDED 60 tablet 0  . Omega-3 Fatty Acids (FISH OIL) 1000 MG CAPS Take by mouth daily.    . pantoprazole (PROTONIX) 40 MG tablet TAKE 1 TABLET(40 MG) BY MOUTH DAILY 90 tablet 3  . propranolol ER (INDERAL LA) 80 MG 24 hr capsule TK 1 C PO QD 90 capsule 3  . Venlafaxine HCl 225 MG TB24 Take 1 tablet (225 mg total) by mouth daily. 30 each 11   No current facility-administered medications on file prior to visit.     Allergies  Allergen Reactions  . Bupropion Hcl     REACTION: rash, nausea, bad taste in mouth,  blurred vision  . Lisinopril     REACTION: cough,dizziness  . Mirtazapine     REACTION: unspecified    Past Medical History:  Diagnosis Date  . Allergic rhinitis   . Allergy   . Anxiety   . Depression   . GERD (gastroesophageal reflux disease)   . HLD (hyperlipidemia)    mild  . HTN (hypertension)   . Migraine   . 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 Tremor, essential     Past Surgical History:  Procedure Laterality Date  . ESOPHAGOGASTRODUODENOSCOPY  05/05/06  . LAPAROSCOPIC CHOLECYSTECTOMY  03/2008  . wisdom teeth removeal      Family History  Problem Relation Age of Onset  . Hypertension Father   . Heart attack Father   . Diabetes Father   . Hypertension Mother   . Tremor Mother   . Hypertension Brother   . Diabetes Brother   . Hypertension Brother   . Cancer Paternal Grandmother     Lung  . Tremor Brother   . Colon cancer Neg Hx   . Esophageal cancer Neg Hx   . Pancreatic cancer Neg Hx   . Rectal cancer Neg Hx   . Stomach cancer Neg Hx  Social History   Social History  . Marital status: Widowed    Spouse name: N/A  . Number of children: 1  . Years of education: N/A   Occupational History  .  Inventory clerk-- laid off 2015         Social History Main Topics  . Smoking status: Current Every Day Smoker    Packs/day: 0.50    Types: Cigarettes  . Smokeless tobacco: Never Used     Comment: restarted after 1 month  . Alcohol use 0.0 oz/week     Comment: rare  . Drug use: No  . Sexual activity: Not on file   Other Topics Concern  . Not on file   Social History Narrative   Widowed 10/12   Engaged with 2 year relationship--but he died Sep 20, 2023   Review of Systems Only eats 1 meal a day--no change Doesn't drink a lot either    Objective:   Physical Exam  Constitutional: She appears well-developed and well-nourished. No distress.  No orthostatic symptoms  Neck: Normal range of motion. Neck supple. No thyromegaly  present.  Cardiovascular: Normal rate, regular rhythm, normal heart sounds and intact distal pulses.  Exam reveals no gallop.   No murmur heard. Pulmonary/Chest: Effort normal and breath sounds normal. No respiratory distress. She has no wheezes. She has no rales.  Abdominal: Soft. There is no tenderness.  Musculoskeletal: She exhibits no edema.  Lymphadenopathy:    She has no cervical adenopathy.  Psychiatric: She has a normal mood and affect. Her behavior is normal.          Assessment & Plan:

## 2016-08-06 NOTE — Progress Notes (Signed)
Pre visit review using our clinic review tool, if applicable. No additional management support is needed unless otherwise documented below in the visit note. 

## 2016-08-06 NOTE — Assessment & Plan Note (Addendum)
BP supine 146/92 and standing 136/90 (HR 60--72). So not really orthostatic Symptoms suggest vasovagal  EKG normal Discussed adequate eating and oral fluids Consider cardiology evaluation if recurrent symptoms

## 2016-10-07 ENCOUNTER — Ambulatory Visit (INDEPENDENT_AMBULATORY_CARE_PROVIDER_SITE_OTHER): Payer: BLUE CROSS/BLUE SHIELD | Admitting: Internal Medicine

## 2016-10-07 ENCOUNTER — Encounter: Payer: Self-pay | Admitting: Internal Medicine

## 2016-10-07 VITALS — BP 108/80 | HR 76 | Temp 98.2°F | Wt 124.0 lb

## 2016-10-07 DIAGNOSIS — F331 Major depressive disorder, recurrent, moderate: Secondary | ICD-10-CM | POA: Diagnosis not present

## 2016-10-07 DIAGNOSIS — R55 Syncope and collapse: Secondary | ICD-10-CM

## 2016-10-07 DIAGNOSIS — G250 Essential tremor: Secondary | ICD-10-CM | POA: Diagnosis not present

## 2016-10-07 MED ORDER — LORAZEPAM 1 MG PO TABS: ORAL_TABLET | ORAL | 0 refills | Status: DC

## 2016-10-07 MED ORDER — HYDROCODONE-ACETAMINOPHEN 5-325 MG PO TABS: ORAL_TABLET | ORAL | 0 refills | Status: DC

## 2016-10-07 NOTE — Progress Notes (Signed)
Subjective:    Patient ID: Heather Russo, female    DOB: 1965/12/20, 50 y.o.   MRN: [id number]  HPI Here for follow up of syncope Has had more spells Feels like she is getting hot--face on down Lots of sweating Turns pale and then passes out Has fallen without injury 3 times At least 2 spells that she has gotten down and aborted the sycnope  No chest pain No SOB No dizziness Occasional racing heart with the sensation  Using the propranolol for the tremor  Depression is worse in the past couple of weeks More crying Not able to get things done and anhedonia No suicidal ideation but not wouldn't mind dying  Current Outpatient Prescriptions on File Prior to Visit  Medication Sig Dispense Refill  . aspirin 81 MG tablet Take 81 mg by mouth daily.    . busPIRone (BUSPAR) 10 MG tablet Take 1 tablet (10 mg total) by mouth 2 (two) times daily. Start with 1/2 tablet bid for first week only 60 tablet 2  . fexofenadine (ALLEGRA) 180 MG tablet Take 180 mg by mouth daily as needed.      Marland Kitchen FLUoxetine (PROZAC) 20 MG tablet Take 1 tablet (20 mg total) by mouth daily. 90 tablet 3  . HYDROcodone-acetaminophen (NORCO/VICODIN) 5-325 MG tablet TAKE 1 TABLET BY MOUTH EVERY 4 HOURS AS NEEDED FOR PAIN 60 tablet 0  . LORazepam (ATIVAN) 1 MG tablet TAKE 1/2 TO 1 TABLET BY MOUTH TWICE DAILY AS NEEDED 60 tablet 0  . Omega-3 Fatty Acids (FISH OIL) 1000 MG CAPS Take by mouth daily.    . pantoprazole (PROTONIX) 40 MG tablet TAKE 1 TABLET(40 MG) BY MOUTH DAILY 90 tablet 3  . propranolol ER (INDERAL LA) 80 MG 24 hr capsule TK 1 C PO QD 90 capsule 3  . Venlafaxine HCl 225 MG TB24 Take 1 tablet (225 mg total) by mouth daily. 30 each 11   No current facility-administered medications on file prior to visit.     Allergies  Allergen Reactions  . Bupropion Hcl     REACTION: rash, nausea, bad taste in mouth, blurred vision  . Lisinopril     REACTION: cough,dizziness  . Mirtazapine     REACTION: unspecified     Past Medical History:  Diagnosis Date  . Allergic rhinitis   . Allergy   . Anxiety   . Depression   . GERD (gastroesophageal reflux disease)   . HLD (hyperlipidemia)    mild  . HTN (hypertension)   . Migraine   . 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 Tremor, essential     Past Surgical History:  Procedure Laterality Date  . ESOPHAGOGASTRODUODENOSCOPY  05/05/06  . LAPAROSCOPIC CHOLECYSTECTOMY  03/2008  . wisdom teeth removeal      Family History  Problem Relation Age of Onset  . Hypertension Father   . Heart attack Father   . Diabetes Father   . Hypertension Mother   . Tremor Mother   . Hypertension Brother   . Diabetes Brother   . Hypertension Brother   . Cancer Paternal Grandmother     Lung  . Tremor Brother   . Colon cancer Neg Hx   . Esophageal cancer Neg Hx   . Pancreatic cancer Neg Hx   . Rectal cancer Neg Hx   . Stomach cancer Neg Hx     Social History   Social History  . Marital status: Widowed  Spouse name: N/A  . Number of children: 1  . Years of education: N/A   Occupational History  .  Inventory clerk-- laid off 2015         Social History Main Topics  . Smoking status: Current Every Day Smoker    Packs/day: 0.50    Types: Cigarettes  . Smokeless tobacco: Never Used     Comment: restarted after 1 month  . Alcohol use 0.0 oz/week     Comment: rare  . Drug use: No  . Sexual activity: Not on file   Other Topics Concern  . Not on file   Social History Narrative   Widowed 10/12   Engaged with 2 year relationship--but he died Sep 22, 2023   Review of Systems  Not sleeping well--though the lorazepam helps initiate but awakens after 3-4 hours Eating okay Still with lots of stress Has had neck pain and decreased ROM for 3 weeks. Gets headache also     Objective:   Physical Exam  Neck:  No sig tightness Only mild restriction of ROM  Cardiovascular: Normal rate, regular rhythm and normal heart sounds.  Exam  reveals no gallop.   No murmur heard. Pulmonary/Chest: Effort normal and breath sounds normal. No respiratory distress. She has no wheezes. She has no rales.  Musculoskeletal: She exhibits no edema.          Assessment & Plan:

## 2016-10-07 NOTE — Progress Notes (Signed)
Pre visit review using our clinic review tool, if applicable. No additional management support is needed unless otherwise documented below in the visit note. 

## 2016-10-07 NOTE — Assessment & Plan Note (Signed)
Exacerbated again No clear reason Will see if she is better off the propranolol  May go on mysoline so don't want to change meds yet Would double fluoxetine if really worsens

## 2016-10-07 NOTE — Assessment & Plan Note (Signed)
Still happening Sounds vasovagal still--- ?relative bradycardia? Will stop the propranolol Proceed with cardiology evalu

## 2016-10-07 NOTE — Patient Instructions (Addendum)
Please stop the propranolol. If the tremor is really bad, call and I will send a prescription for another medication to try. Call me if the depression really worsens.

## 2016-10-07 NOTE — Assessment & Plan Note (Signed)
Will try off the propranolol Will start mysoline if tremors are bad

## 2016-10-11 ENCOUNTER — Encounter: Payer: Self-pay | Admitting: Cardiology

## 2016-10-11 ENCOUNTER — Ambulatory Visit (INDEPENDENT_AMBULATORY_CARE_PROVIDER_SITE_OTHER): Payer: BLUE CROSS/BLUE SHIELD | Admitting: Cardiology

## 2016-10-11 VITALS — BP 128/93 | Ht 61.5 in | Wt 122.1 lb

## 2016-10-11 DIAGNOSIS — F17200 Nicotine dependence, unspecified, uncomplicated: Secondary | ICD-10-CM | POA: Diagnosis not present

## 2016-10-11 DIAGNOSIS — R55 Syncope and collapse: Secondary | ICD-10-CM

## 2016-10-11 DIAGNOSIS — I10 Essential (primary) hypertension: Secondary | ICD-10-CM | POA: Diagnosis not present

## 2016-10-11 NOTE — Progress Notes (Signed)
Cardiology Office Note   Date:  10/11/2016   ID:  Heather Russo, DOB 03/23/1966, MRN [id number]  Referring Doctor:  Viviana Simpler, MD   Cardiologist:   Wende Bushy, MD   Reason for consultation:  Chief Complaint  Patient presents with  . other    New Patient. Orthostatics. Referred by Dr. Silvio Pate for syncope on 10/07/16. Pt ongoing sycope since visit. Reviewed meds verbally with pt.      History of Present Illness: Heather Russo is a 50 y.o. female who presents for Episodes of passing out. In terms occurred probably beginning 2 months ago. The most recent episode was one week ago. She describes sitting out in her porch. She stood up and went inside. Upon standing, she immediately felt warm and sweaty all over in vision turning dark. She wanted to go down in a couch but did not make it, fell to the floor. She did not sustain any major injuries. She did not recall any chest pain or shortness of breath or palpitations during the episode.  Previous episodes occurred after prolonged standing. 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 She may have been standing for at least 30 minutes or she started feeling sweaty and warm all over and thought that maybe she went to the bathroom and splash water on her facial will feel better. She turned around and fell down to the ground. It was witnessed by a friend. Again no major injury. No chest pain, or palpitations. Other times that it happened after prolonged standing for 45 minutes or up on standing up from sitting position.  She describes some atypical chest pain, sharp pricking sensation in the chest, mild intensity, lasts a few minutes at a time, resolving spontaneously. Not completely exertional in nature.  Patient denies palpitations, abdominal pain, shortness of breath, orthopnea or PND. She may need drinks sweet tea at least 3 times in a day, and drinks one glass of water at the most in a day.   ROS:  Please see the history of present illness. Aside  from mentioned under HPI, all other systems are reviewed and negative.     Past Medical History:  Diagnosis Date  . Allergic rhinitis   . Allergy   . Anxiety   . Depression   . Depression   . GERD (gastroesophageal reflux disease)   . HLD (hyperlipidemia)    mild  . HTN (hypertension)   . Migraine   . 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 Syncope and collapse   . Tremor, essential     Past Surgical History:  Procedure Laterality Date  . ESOPHAGOGASTRODUODENOSCOPY  05/05/06  . LAPAROSCOPIC CHOLECYSTECTOMY  03/2008  . wisdom teeth removeal       reports that she has been smoking Cigarettes.  She has been smoking about 1.00 pack per day. She has never used smokeless tobacco. She reports that she drinks alcohol. She reports that she does not use drugs.   family history includes Cancer in her paternal grandmother; Diabetes in her brother and father; Heart attack in her father; Hypertension in her brother, brother, father, and mother; Tremor in her brother and mother.   Outpatient Medications Prior to Visit  Medication Sig Dispense Refill  . aspirin 81 MG tablet Take 81 mg by mouth daily.    . busPIRone (BUSPAR) 10 MG tablet Take 1 tablet (10 mg total) by mouth 2 (two) times daily. Start with 1/2 tablet bid for  first week only 60 tablet 2  . fexofenadine (ALLEGRA) 180 MG tablet Take 180 mg by mouth daily as needed.      Marland Kitchen FLUoxetine (PROZAC) 20 MG tablet Take 1 tablet (20 mg total) by mouth daily. 90 tablet 3  . HYDROcodone-acetaminophen (NORCO/VICODIN) 5-325 MG tablet TAKE 1 TABLET BY MOUTH EVERY 4 HOURS AS NEEDED FOR PAIN 60 tablet 0  . LORazepam (ATIVAN) 1 MG tablet TAKE 1/2 TO 1 TABLET BY MOUTH TWICE DAILY AS NEEDED 60 tablet 0  . Omega-3 Fatty Acids (FISH OIL) 1000 MG CAPS Take by mouth daily.    . pantoprazole (PROTONIX) 40 MG tablet TAKE 1 TABLET(40 MG) BY MOUTH DAILY 90 tablet 3  . Venlafaxine HCl 225 MG TB24 Take 1 tablet (225 mg total) by mouth  daily. 30 each 11   No facility-administered medications prior to visit.      Allergies: Bupropion hcl; Lisinopril; and Mirtazapine    PHYSICAL EXAM: VS:  BP (!) 128/93 (BP Location: Right Arm, Patient Position: Supine, Cuff Size: Normal)   Ht 5' 1.5" (1.562 m)   Wt 122 lb 1.9 oz (55.4 kg)   BMI 22.70 kg/m  , Body mass index is 22.7 kg/m. Wt Readings from Last 3 Encounters:  10/11/16 122 lb 1.9 oz (55.4 kg)  10/07/16 124 lb (56.2 kg)  08/06/16 120 lb (54.4 kg)    Orthostatic VS for the past 24 hrs:  BP- Lying Pulse- Lying BP- Sitting Pulse- Sitting BP- Standing at 0 minutes Pulse- Standing at 0 minutes  10/11/16 1132 (!) 133/94 84 (!) 132/93 90 (!) 129/94 97  10/11/16 1112 136/86 - 128/89 - - -  10/11/16 1108 136/86 - - - - -  10/11/16 1106 (!) 128/93 - - - - -      GENERAL:  well developed, well nourished, obese, not in acute distress HEENT: normocephalic, pink conjunctivae, anicteric sclerae, no xanthelasma, normal dentition, oropharynx clear NECK:  no neck vein engorgement, JVP normal, no hepatojugular reflux, carotid upstroke brisk and symmetric, no bruit, no thyromegaly, no lymphadenopathy LUNGS:  good respiratory effort, clear to auscultation bilaterally CV:  PMI not displaced, no thrills, no lifts, S1 and S2 within normal limits, no palpable S3 or S4, no murmurs, no rubs, no gallops ABD:  Soft, nontender, nondistended, normoactive bowel sounds, no abdominal aortic bruit, no hepatomegaly, no splenomegaly MS: nontender back, no kyphosis, no scoliosis, no joint deformities EXT:  2+ DP/PT pulses, no edema, no varicosities, no cyanosis, no clubbing SKIN: warm, nondiaphoretic, normal turgor, no ulcers NEUROPSYCH: alert, oriented to person, place, and time, sensory/motor grossly intact, normal mood, appropriate affect  Recent Labs: 07/02/2016: ALT 19; BUN 13; Creatinine, Ser 0.83; Hemoglobin 16.6; Platelets 341.0; Potassium 4.7; Sodium 139   Lipid Panel    Component Value  Date/Time   CHOL 144 05/27/2015 0854   TRIG 93.0 05/27/2015 0854   HDL 33.20 (L) 05/27/2015 0854   CHOLHDL 4 05/27/2015 0854   VLDL 18.6 05/27/2015 0854   LDLCALC 92 05/27/2015 0854   LDLDIRECT 178.7 12/22/2010 1224     Other studies Reviewed:  EKG:  The ekg from 10/11/2016 was personally reviewed by me and it revealed sinus rhythm, 92 BPM.  Additional studies/ records that were reviewed personally reviewed by me today include: None available   ASSESSMENT AND PLAN:  Episodes of loss of consciousness Based on her history, these are mainly orthostatic in nature. There is some evidence of orthostatic change in blood pressure and heart rate on her orthostatic  vital signs. We had a detailed and lengthy discussion about maneuvers she can do to abort episodes. Emphasized importance of proper hydration and proper nutrition. Avoidance of prolonged standing or sudden changes in position. We also discussed her medication list and most of these causing dizziness. Advised that she may need to talk to her PCP about possible drug interactions. To rule out any evidence of ischemia, we will recommend echocardiogram and stress echocardiogram. Risk factors for CAD include questionable history of hypertension and hyperlipidemia, and family history.  Tobacco use We discussed the importance of smoking cessation and different strategies for quitting.    Current medicines are reviewed at length with the patient today.  The patient does not have concerns regarding medicines.  Labs/ tests ordered today include:  Orders Placed This Encounter  Procedures  . EKG 12-Lead  . ECHOCARDIOGRAM COMPLETE  . ECHOCARDIOGRAM STRESS TEST    I had a lengthy and detailed discussion with the patient regarding diagnoses, prognosis, diagnostic options, treatment options , and side effects of medications.   I counseled the patient on importance of lifestyle modification including heart healthy diet, regular physical  activity , and smoking cessation.   Disposition:   FU with undersigned after tests   Signed, Wende Bushy, MD  10/11/2016 12:42 PM    [place]  This note was generated in part with voice recognition software and I apologize for any typographical errors that were not detected and corrected.

## 2016-10-11 NOTE — Patient Instructions (Addendum)
Make sure to stay hydrated.  Testing/Procedures: Your physician has requested that you have an echocardiogram. Echocardiography is a painless test that uses sound waves to create images of your heart. It provides your doctor with information about the size and shape of your heart and how well your heart's chambers and valves are working. This procedure takes approximately one hour. There are no restrictions for this procedure.  Your physician has requested that you have a stress echocardiogram. For further information please visit [email or web address]. Please follow instruction sheet as given.   Do not drink or eat foods with caffeine for 24 hours before the test. (Chocolate, coffee, tea, or energy drinks)  If you use an inhaler, bring it with you to the test.  Do not smoke for 4 hours before the test.  Wear comfortable shoes and clothing.  Follow-Up: Your physician recommends that you schedule a follow-up appointment as needed with Dr. Yvone Neu. We will call you with results and if needed schedule follow up at that time.  It was a pleasure seeing you today here in the office. Please do not hesitate to give [place] a call back if you have any further questions. [place], BSN      Exercise Stress Echocardiogram An exercise stress echocardiogram is a test that checks how well your heart is working. For this test, you will walk on a treadmill to make your heart beat faster. This test uses sound waves (ultrasound) and a computer to make pictures (images) of your heart. These pictures will be taken before you exercise and after you exercise. What happens before the procedure?  Follow instructions from your doctor about what you cannot eat or drink before the test.  Do not drink or eat anything that has caffeine in it. Stop having caffeine for 24 hours before the test.  Ask your doctor about changing or stopping your normal medicines. This is important if you take diabetes  medicines or blood thinners. Ask your doctor if you should take your medicines with water before the test.  If you use an inhaler, bring it to the test.  Do not use any products that have nicotine or tobacco in them, such as cigarettes and e-cigarettes. Stop using them for 4 hours before the test. If you need help quitting, ask your doctor.  Wear comfortable shoes and clothing. What happens during the procedure?  You will be hooked up to a TV screen. Your doctor will watch the screen to see how fast your heart beats during the test.  Before you exercise, a computer will make a picture of your heart. To do this:  A gel will be put on your chest.  A wand will be moved over the gel.  Sound waves from the wand will go to the computer to make the picture.  Your will start walking on a treadmill. The treadmill will start at a slow speed. It will get faster a little bit at a time. When you walk faster, your heart will beat faster.  The treadmill will be stopped when your heart is working hard.  You will lie down right away so another picture of your heart can be taken.  The test will take 30-60 minutes. What happens after the procedure?  Your heart rate and blood pressure will be watched after the test.  If your doctor says that you can, you may:  Eat what you usually eat.  Do your normal activities.  Take medicines like normal. Summary  An exercise stress echocardiogram is a test that checks how well your heart is working.  Follow instructions about what you cannot eat or drink before the test. Ask your doctor if you should take your normal medicines before the test.  Stop having caffeine for 24 hours before the test. Do not use anything with nicotine or tobacco in it for 4 hours before the test.  A computer will take a picture of your heart before you walk on a treadmill. It will take another picture when you are done walking.  Your heart rate and blood pressure will be  watched after the test. This information is not intended to replace advice given to you by your health care provider. Make sure you discuss any questions you have with your health care provider. Document Released: 09/05/2009 Document Revised: 08/01/2016 Document Reviewed: 08/01/2016 Elsevier Interactive Patient Education  2017 [employer]. Echocardiogram An echocardiogram, or echocardiography, uses sound waves (ultrasound) to produce an image of your heart. The echocardiogram is simple, painless, obtained within a short period of time, and offers valuable information to your health care provider. The images from an echocardiogram can provide information such as:  Evidence of coronary artery disease (CAD).  Heart size.  Heart muscle function.  Heart valve function.  Aneurysm detection.  Evidence of a past heart attack.  Fluid buildup around the heart.  Heart muscle thickening.  Assess heart valve function. Tell a health care provider about:  Any allergies you have.  All medicines you are taking, including vitamins, herbs, eye drops, creams, and over-the-counter medicines.  Any problems you or family members have had with anesthetic medicines.  Any blood disorders you have.  Any surgeries you have had.  Any medical conditions you have.  Whether you are pregnant or may be pregnant. What happens before the procedure? No special preparation is needed. Eat and drink normally. What happens during the procedure?  In order to produce an image of your heart, gel will be applied to your chest and a wand-like tool (transducer) will be moved over your chest. The gel will help transmit the sound waves from the transducer. The sound waves will harmlessly bounce off your heart to allow the heart images to be captured in real-time motion. These images will then be recorded.  You may need an IV to receive a medicine that improves the quality of the pictures. What happens after the  procedure? You may return to your normal schedule including diet, activities, and medicines, unless your health care provider tells you otherwise. This information is not intended to replace advice given to you by your health care provider. Make sure you discuss any questions you have with your health care provider. Document Released: 11/05/2000 Document Revised: 06/26/2016 Document Reviewed: 07/16/2013 Elsevier Interactive Patient Education  2017 [employer].

## 2016-10-12 ENCOUNTER — Ambulatory Visit: Payer: BLUE CROSS/BLUE SHIELD | Admitting: Cardiology

## 2016-10-12 ENCOUNTER — Telehealth: Payer: Self-pay

## 2016-10-12 NOTE — Telephone Encounter (Signed)
Pt left v/m; pt seen 10/07/16 and tremors are getting worse. Pt cannot hold anything. Pt request med sent to [facility] in [place]. Pt request cb.

## 2016-10-13 MED ORDER — PRIMIDONE 250 MG PO TABS: 250.0000 mg | ORAL_TABLET | Freq: Four times a day (QID) | ORAL | 1 refills | Status: DC

## 2016-10-13 NOTE — Telephone Encounter (Signed)
Left detailed message on VM per DPR. 

## 2016-10-13 NOTE — Telephone Encounter (Signed)
Let her know that I sent the prescription for the other tremor medicine that I spoke about. It says 4 times a day--but she may do fine with 3 times. If it doesn't help much, we can consider increasing the dose.

## 2016-10-23 ENCOUNTER — Encounter: Payer: Self-pay | Admitting: Emergency Medicine

## 2016-10-23 ENCOUNTER — Ambulatory Visit
Admission: EM | Admit: 2016-10-23 | Discharge: 2016-10-23 | Disposition: A | Discharge status: Home / Self Care | Payer: BLUE CROSS/BLUE SHIELD | Attending: Family Medicine | Admitting: Family Medicine

## 2016-10-23 DIAGNOSIS — L509 Urticaria, unspecified: Secondary | ICD-10-CM | POA: Diagnosis not present

## 2016-10-23 MED ORDER — FAMOTIDINE 20 MG PO TABS
20.0000 mg | ORAL_TABLET | Freq: Once | ORAL | Status: AC
  Administered 2016-10-23: 20 mg via ORAL

## 2016-10-23 MED ORDER — METHYLPREDNISOLONE SODIUM SUCC 125 MG IJ SOLR
125.0000 mg | Freq: Once | INTRAMUSCULAR | Status: AC
  Administered 2016-10-23: 125 mg via INTRAMUSCULAR

## 2016-10-23 MED ORDER — DIPHENHYDRAMINE HCL 50 MG PO CAPS
50.0000 mg | ORAL_CAPSULE | Freq: Once | ORAL | Status: AC
  Administered 2016-10-23: 50 mg via ORAL

## 2016-10-23 NOTE — ED Provider Notes (Signed)
[facility]    CSN: [id number] Arrival date & time: 10/23/16  1452     History   Chief Complaint Chief Complaint  Patient presents with  . Allergic Reaction    HPI Heather Russo is a 50 y.o. female.   50 yo female with a c/o itchy, red rash all over her body since she woke up this morning around 10:30 am. Denies any shortness of breath, swelling, fevers, chills, difficulty breathing. States last week started a new medication for tremors and the last dose taken was last night. Denies any other new contacts or known potential causes of allergic reaction.    The history is provided by the patient.  Allergic Reaction    Past Medical History:  Diagnosis Date  . Allergic rhinitis   . Allergy   . Anxiety   . Depression   . Depression   . GERD (gastroesophageal reflux disease)   . HLD (hyperlipidemia)    mild  . HTN (hypertension)   . Migraine   . 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 Syncope and collapse   . Tremor, essential     Patient Active Problem List   Diagnosis Date Noted  . Syncope 08/06/2016  . Special screening for malignant neoplasms, colon 05/28/2016  . Early satiety 05/28/2016  . Esophageal dysphagia 05/18/2016  . MDD (major depressive disorder), recurrent episode, moderate ([place]) 12/24/2015  . Chronic back pain 12/24/2015  . Routine general medical examination at a health care facility 04/03/2014  . Essential tremor 01/22/2011  . ANXIETY, SITUATIONAL 05/14/2010  . COMMON MIGRAINE 04/03/2007  . Essential hypertension, benign 04/03/2007  . ALLERGIC RHINITIS 04/03/2007  . GERD 04/03/2007    Past Surgical History:  Procedure Laterality Date  . ESOPHAGOGASTRODUODENOSCOPY  05/05/06  . LAPAROSCOPIC CHOLECYSTECTOMY  03/2008  . wisdom teeth removeal      OB History    No data available       Home Medications    Prior to Admission medications   Medication Sig Start Date End Date Taking? Authorizing Provider    aspirin 81 MG tablet Take 81 mg by mouth daily.    Historical Provider, MD  busPIRone (BUSPAR) 10 MG tablet Take 1 tablet (10 mg total) by mouth 2 (two) times daily. Start with 1/2 tablet bid for first week only 06/07/16   Gatha Mayer, MD  fexofenadine (ALLEGRA) 180 MG tablet Take 180 mg by mouth daily as needed.      Historical Provider, MD  FLUoxetine (PROZAC) 20 MG tablet Take 1 tablet (20 mg total) by mouth daily. 02/17/16   [name], MD  HYDROcodone-acetaminophen (NORCO/VICODIN) 5-325 MG tablet TAKE 1 TABLET BY MOUTH EVERY 4 HOURS AS NEEDED FOR PAIN 10/07/16   [name], MD  LORazepam (ATIVAN) 1 MG tablet TAKE 1/2 TO 1 TABLET BY MOUTH TWICE DAILY AS NEEDED 10/07/16   [name], MD  Multiple Vitamin (MULTIVITAMIN) tablet Take 1 tablet by mouth daily.    Historical Provider, MD  Omega-3 Fatty Acids (FISH OIL) 1000 MG CAPS Take by mouth daily.    Historical Provider, MD  pantoprazole (PROTONIX) 40 MG tablet TAKE 1 TABLET(40 MG) BY MOUTH DAILY 12/24/15   [name], MD  primidone (MYSOLINE) 250 MG tablet Take 1 tablet (250 mg total) by mouth 4 (four) times daily. 10/13/16   [name], MD  Venlafaxine HCl 225 MG TB24 Take 1 tablet (225 mg total) by  mouth daily. 02/17/16   [name], MD    Family History Family History  Problem Relation Age of Onset  . Hypertension Father   . Heart attack Father   . Diabetes Father   . Hypertension Mother   . Tremor Mother   . Hypertension Brother   . Diabetes Brother   . Hypertension Brother   . Cancer Paternal Grandmother     Lung  . Tremor Brother   . Colon cancer Neg Hx   . Esophageal cancer Neg Hx   . Pancreatic cancer Neg Hx   . Rectal cancer Neg Hx   . Stomach cancer Neg Hx     Social History Social History  Substance Use Topics  . Smoking status: Current Every Day Smoker    Packs/day: 1.00    Types: Cigarettes  . Smokeless tobacco: Never Used     Comment: states cut back smoking to1 pack a  week.  . Alcohol use 0.0 oz/week     Comment: rare     Allergies   Bupropion hcl; Lisinopril; and Mirtazapine   Review of Systems Review of Systems   Physical Exam Triage Vital Signs ED Triage Vitals  Enc Vitals Group     BP 10/23/16 1458 120/88     Pulse Rate 10/23/16 1458 (!) 106     Resp 10/23/16 1458 16     Temp 10/23/16 1458 98.5 F (36.9 C)     Temp Source 10/23/16 1458 Oral     SpO2 10/23/16 1458 99 %     Weight 10/23/16 1459 120 lb (54.4 kg)     Height 10/23/16 1459 5' 1.5" (1.562 m)     Head Circumference --      Peak Flow --      Pain Score 10/23/16 1500 2     Pain Loc --      Pain Edu? --      Excl. in [place]? --    No data found.   Updated Vital Signs BP 112/72 (BP Location: Right Arm)   Pulse 85   Temp 98.5 F (36.9 C) (Oral)   Resp 16   Ht 5' 1.5" (1.562 m)   Wt 120 lb (54.4 kg)   SpO2 98%   BMI 22.31 kg/m   Visual Acuity Right Eye Distance:   Left Eye Distance:   Bilateral Distance:    Right Eye Near:   Left Eye Near:    Bilateral Near:     Physical Exam  Constitutional: She appears well-developed and well-nourished. No distress.  HENT:  Head: Normocephalic and atraumatic.  Right Ear: Tympanic membrane, external ear and ear canal normal.  Left Ear: Tympanic membrane, external ear and ear canal normal.  Nose: No mucosal edema, rhinorrhea, nose lacerations, sinus tenderness, nasal deformity, septal deviation or nasal septal hematoma. No epistaxis.  No foreign bodies. Right sinus exhibits no maxillary sinus tenderness and no frontal sinus tenderness. Left sinus exhibits no maxillary sinus tenderness and no frontal sinus tenderness.  Mouth/Throat: Uvula is midline, oropharynx is clear and moist and mucous membranes are normal. No oropharyngeal exudate.  Eyes: Conjunctivae and EOM are normal. Pupils are equal, round, and reactive to light. Right eye exhibits no discharge. Left eye exhibits no discharge. No scleral icterus.  Neck: Normal range  of motion. Neck supple. No thyromegaly present.  Cardiovascular: Normal rate, regular rhythm and normal heart sounds.   Pulmonary/Chest: Effort normal and breath sounds normal. No respiratory distress. She has no wheezes. She has  no rales.  Lymphadenopathy:    She has no cervical adenopathy.  Skin: Rash noted. Rash is urticarial (diffuse). She is not diaphoretic. There is erythema.  Nursing note and vitals reviewed.    UC Treatments / Results  Labs (all labs ordered are listed, but only abnormal results are displayed) Labs Reviewed - No data to display  EKG  EKG Interpretation None       Radiology No results found.  Procedures Procedures (including critical care time)  Medications Ordered in UC Medications  diphenhydrAMINE (BENADRYL) capsule 50 mg (50 mg Oral Given 10/23/16 1521)  famotidine (PEPCID) tablet 20 mg (20 mg Oral Given 10/23/16 1520)  methylPREDNISolone sodium succinate (SOLU-MEDROL) 125 mg/2 mL injection 125 mg (125 mg Intramuscular Given 10/23/16 1518)     Initial Impression / Assessment and Plan / UC Course  I have reviewed the triage vital signs and the nursing notes.  Pertinent labs & imaging results that were available during my care of the patient were reviewed by me and considered in my medical decision making (see chart for details).  Clinical Course       Final Clinical Impressions(s) / UC Diagnoses   Final diagnoses:  Hives    New Prescriptions Discharge Medication List as of 10/23/2016  4:22 PM     1. diagnosis reviewed with patient; given Benadryl 50mg  po, famotidine 20mg  po and solumedrol 125mg  IM x1 with significant improvement of symptoms 2. Recommend supportive treatment with benadryl prn, famotidine 3. Stop newest medication and contact PCP on Monday morning regarding change 4. Follow-up prn if symptoms worsen or don't improve   Norval Gable, MD 10/23/16 [phone]

## 2016-10-23 NOTE — ED Triage Notes (Signed)
Patient states that she woke up in a red itchy rash from head to toe this morning around 10:30am.  Patient denies SOB or difficulty breathing.

## 2016-10-23 NOTE — Discharge Instructions (Signed)
Continue Benadryl every 4-6 hours Zantac (ranitidine) once daily

## 2016-10-25 ENCOUNTER — Telehealth: Payer: Self-pay | Admitting: *Deleted

## 2016-10-25 DIAGNOSIS — G250 Essential tremor: Secondary | ICD-10-CM

## 2016-10-25 NOTE — Telephone Encounter (Signed)
Let her know that I put the primidone on her allergy list. I think it would be best for her to see a neurologist to decide about other options---it is not clear cut (if she is willing, find out if she prefers [facility] or [place])

## 2016-10-25 NOTE — Telephone Encounter (Signed)
Spoke to pt. She would like to go to [facility].

## 2016-10-25 NOTE — Telephone Encounter (Signed)
Patient left a voicemail stating that she when to the ED over the weekend because she woke up with hives. Patient stated that she was told not to take the medication given to her for her tremors/shakes. Patient stated that she needs a different medication to take for the shakes.

## 2016-10-26 ENCOUNTER — Telehealth: Payer: Self-pay

## 2016-10-26 NOTE — Telephone Encounter (Signed)
Courtesy call back completed today for patient's recent visit at [facility]. Patient did not answer, left message on machine to call back with any questions or concerns.   

## 2016-11-09 ENCOUNTER — Other Ambulatory Visit: Payer: Self-pay | Admitting: Cardiology

## 2016-11-09 DIAGNOSIS — R079 Chest pain, unspecified: Secondary | ICD-10-CM

## 2016-11-09 DIAGNOSIS — R55 Syncope and collapse: Secondary | ICD-10-CM

## 2016-11-16 ENCOUNTER — Ambulatory Visit (INDEPENDENT_AMBULATORY_CARE_PROVIDER_SITE_OTHER): Payer: BLUE CROSS/BLUE SHIELD

## 2016-11-16 ENCOUNTER — Ambulatory Visit (INDEPENDENT_AMBULATORY_CARE_PROVIDER_SITE_OTHER): Payer: BLUE CROSS/BLUE SHIELD | Admitting: Internal Medicine

## 2016-11-16 ENCOUNTER — Encounter: Payer: Self-pay | Admitting: Internal Medicine

## 2016-11-16 ENCOUNTER — Other Ambulatory Visit: Payer: Self-pay

## 2016-11-16 VITALS — BP 130/84 | HR 73 | Temp 98.2°F | Wt 122.0 lb

## 2016-11-16 DIAGNOSIS — G250 Essential tremor: Secondary | ICD-10-CM

## 2016-11-16 DIAGNOSIS — R55 Syncope and collapse: Secondary | ICD-10-CM

## 2016-11-16 DIAGNOSIS — R079 Chest pain, unspecified: Secondary | ICD-10-CM

## 2016-11-16 DIAGNOSIS — M549 Dorsalgia, unspecified: Secondary | ICD-10-CM | POA: Diagnosis not present

## 2016-11-16 DIAGNOSIS — G8929 Other chronic pain: Secondary | ICD-10-CM

## 2016-11-16 DIAGNOSIS — I10 Essential (primary) hypertension: Secondary | ICD-10-CM

## 2016-11-16 LAB — ECHOCARDIOGRAM STRESS TEST
Estimated workload: 11.7 METS
Exercise duration (min): 10 min
Exercise duration (sec): 1 s
MPHR: 169 {beats}/min
Peak HR: 169 {beats}/min
Percent HR: 100 %
Rest HR: 91 {beats}/min

## 2016-11-16 LAB — ECHOCARDIOGRAM COMPLETE: Weight: 1952 oz

## 2016-11-16 NOTE — Assessment & Plan Note (Signed)
Working with Dr Melrose Nakayama now

## 2016-11-16 NOTE — Progress Notes (Signed)
Subjective:    Patient ID: Heather Russo, female    DOB: 16-Oct-1966, 50 y.o.   MRN: [id number]  HPI Here for follow up after syncopal spells  No further spells of dizziness and no syncope Is going for echo and stress test today  Reviewed visit with Dr Eather Colas on gabapentin and currently titrating up No clear response with the tremor She is still bothered by this  Takes the hydrocodone regularly This gives her good relief  Current Outpatient Prescriptions on File Prior to Visit  Medication Sig Dispense Refill  . aspirin 81 MG tablet Take 81 mg by mouth daily.    . busPIRone (BUSPAR) 10 MG tablet Take 1 tablet (10 mg total) by mouth 2 (two) times daily. Start with 1/2 tablet bid for first week only 60 tablet 2  . fexofenadine (ALLEGRA) 180 MG tablet Take 180 mg by mouth daily as needed.      Marland Kitchen FLUoxetine (PROZAC) 20 MG tablet Take 1 tablet (20 mg total) by mouth daily. 90 tablet 3  . HYDROcodone-acetaminophen (NORCO/VICODIN) 5-325 MG tablet TAKE 1 TABLET BY MOUTH EVERY 4 HOURS AS NEEDED FOR PAIN 60 tablet 0  . LORazepam (ATIVAN) 1 MG tablet TAKE 1/2 TO 1 TABLET BY MOUTH TWICE DAILY AS NEEDED 60 tablet 0  . Multiple Vitamin (MULTIVITAMIN) tablet Take 1 tablet by mouth daily.    . Omega-3 Fatty Acids (FISH OIL) 1000 MG CAPS Take by mouth daily.    . pantoprazole (PROTONIX) 40 MG tablet TAKE 1 TABLET(40 MG) BY MOUTH DAILY 90 tablet 3  . Venlafaxine HCl 225 MG TB24 Take 1 tablet (225 mg total) by mouth daily. 30 each 11   No current facility-administered medications on file prior to visit.     Allergies  Allergen Reactions  . Bupropion Hcl     REACTION: rash, nausea, bad taste in mouth, blurred vision  . Lisinopril     REACTION: cough,dizziness  . Mirtazapine     REACTION: unspecified  . Mysoline [Primidone] Hives    Likely cause of hives    Past Medical History:  Diagnosis Date  . Allergic rhinitis   . Allergy   . Anxiety   . Depression   . Depression   . GERD  (gastroesophageal reflux disease)   . HLD (hyperlipidemia)    mild  . HTN (hypertension)   . Migraine   . 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 Syncope and collapse   . Tremor, essential     Past Surgical History:  Procedure Laterality Date  . ESOPHAGOGASTRODUODENOSCOPY  05/05/06  . LAPAROSCOPIC CHOLECYSTECTOMY  03/2008  . wisdom teeth removeal      Family History  Problem Relation Age of Onset  . Hypertension Father   . Heart attack Father   . Diabetes Father   . Hypertension Mother   . Tremor Mother   . Hypertension Brother   . Diabetes Brother   . Hypertension Brother   . Cancer Paternal Grandmother     Lung  . Tremor Brother   . Colon cancer Neg Hx   . Esophageal cancer Neg Hx   . Pancreatic cancer Neg Hx   . Rectal cancer Neg Hx   . Stomach cancer Neg Hx     Social History   Social History  . Marital status: Widowed    Spouse name: N/A  . Number of children: 1  . Years of education: N/A   Occupational  History  .  Inventory clerk-- laid off 2015         Social History Main Topics  . Smoking status: Current Every Day Smoker    Packs/day: 1.00    Types: Cigarettes  . Smokeless tobacco: Never Used     Comment: states cut back smoking to1 pack a week.  . Alcohol use 0.0 oz/week     Comment: rare  . Drug use: No  . Sexual activity: Not on file   Other Topics Concern  . Not on file   Social History Narrative   Widowed 10/12   Engaged with 2 year relationship--but he died 2023/09/29   Review of Systems  Still not sleeping well Mood is "about the same" Looking for work--no success Does go out to visit friends and grandkids--does enjoy this     Objective:   Physical Exam  Constitutional: She appears well-nourished. No distress.  Psychiatric: She has a normal mood and affect. Her behavior is normal.          Assessment & Plan:

## 2016-11-16 NOTE — Assessment & Plan Note (Signed)
Will continue the pain regimen Checked CSRS--noone but this office

## 2016-11-16 NOTE — Assessment & Plan Note (Signed)
No recurrence off the beta blocker

## 2016-11-16 NOTE — Assessment & Plan Note (Signed)
BP Readings from Last 3 Encounters:  11/16/16 130/84  10/23/16 112/72  10/11/16 (!) 128/93   Good control

## 2016-11-16 NOTE — Progress Notes (Signed)
Pre visit review using our clinic review tool, if applicable. No additional management support is needed unless otherwise documented below in the visit note. 

## 2016-11-23 ENCOUNTER — Other Ambulatory Visit: Payer: Self-pay | Admitting: Internal Medicine

## 2016-12-13 ENCOUNTER — Other Ambulatory Visit: Payer: Self-pay

## 2016-12-13 MED ORDER — HYDROCODONE-ACETAMINOPHEN 5-325 MG PO TABS: ORAL_TABLET | ORAL | 0 refills | Status: DC

## 2016-12-13 MED ORDER — LORAZEPAM 1 MG PO TABS
ORAL_TABLET | ORAL | 0 refills | Status: DC
Start: 2016-12-13 — End: 2017-02-07

## 2016-12-13 NOTE — Telephone Encounter (Signed)
Spoke to pt. Rxs up front ready for pickup. 

## 2016-12-13 NOTE — Telephone Encounter (Signed)
Pt left v/m requesting rx hydrocodone apap (last printed # 60 on 10/07/16) and lorazepam (last printed # 60 on 10/07/16). Pt last seen 11/16/16. Call when ready for pick up.

## 2016-12-16 ENCOUNTER — Ambulatory Visit (INDEPENDENT_AMBULATORY_CARE_PROVIDER_SITE_OTHER): Payer: BLUE CROSS/BLUE SHIELD

## 2016-12-16 DIAGNOSIS — Z23 Encounter for immunization: Secondary | ICD-10-CM

## 2017-01-26 ENCOUNTER — Other Ambulatory Visit: Payer: Self-pay

## 2017-01-26 NOTE — Telephone Encounter (Signed)
Pt requesting rx hydrocodone apap. Call when ready for pick up. Last printed # 60 on 12/13/16. Last seen 11/16/16. Pt has 2 pills left.

## 2017-01-27 MED ORDER — HYDROCODONE-ACETAMINOPHEN 5-325 MG PO TABS: ORAL_TABLET | ORAL | 0 refills | Status: DC

## 2017-01-27 NOTE — Telephone Encounter (Signed)
Spoke to pt and advised Rx is available for pickup from the front desk. Pt advised third party unable to pickup

## 2017-01-27 NOTE — Telephone Encounter (Signed)
RX printed and signed and placed in MYD box 

## 2017-01-28 ENCOUNTER — Encounter: Payer: Self-pay | Admitting: Internal Medicine

## 2017-01-28 ENCOUNTER — Other Ambulatory Visit: Payer: BLUE CROSS/BLUE SHIELD

## 2017-01-28 DIAGNOSIS — Z0283 Encounter for blood-alcohol and blood-drug test: Secondary | ICD-10-CM

## 2017-02-03 ENCOUNTER — Encounter: Payer: Self-pay | Admitting: *Deleted

## 2017-02-03 LAB — TOXASSURE SELECT 13 (MW), URINE

## 2017-02-07 ENCOUNTER — Other Ambulatory Visit: Payer: Self-pay | Admitting: Internal Medicine

## 2017-02-08 NOTE — Telephone Encounter (Signed)
Left refill on voice mail at pharmacy  

## 2017-02-08 NOTE — Telephone Encounter (Signed)
Approved: #60 x 0 

## 2017-02-08 NOTE — Telephone Encounter (Signed)
Last filled 12-16-16 #60 Last OV 11-16-16 Next OV 05-17-17

## 2017-02-13 ENCOUNTER — Other Ambulatory Visit: Payer: Self-pay | Admitting: Internal Medicine

## 2017-02-25 ENCOUNTER — Other Ambulatory Visit: Payer: Self-pay | Admitting: Internal Medicine

## 2017-03-08 ENCOUNTER — Other Ambulatory Visit: Payer: Self-pay | Admitting: Internal Medicine

## 2017-03-16 ENCOUNTER — Other Ambulatory Visit: Payer: Self-pay | Admitting: *Deleted

## 2017-03-16 MED ORDER — HYDROCODONE-ACETAMINOPHEN 5-325 MG PO TABS: ORAL_TABLET | ORAL | 0 refills | Status: DC

## 2017-03-16 NOTE — Telephone Encounter (Signed)
Last OV 11/16/17. Last Rx 01/27/2017. pls advise

## 2017-03-16 NOTE — Telephone Encounter (Signed)
Spoke to pt. Rx up front. 

## 2017-04-05 ENCOUNTER — Other Ambulatory Visit: Payer: Self-pay | Admitting: Internal Medicine

## 2017-04-05 NOTE — Telephone Encounter (Signed)
Last filled 02-08-17 #60 Last OV 11-16-16 Next OV 05-17-17

## 2017-04-05 NOTE — Telephone Encounter (Signed)
Left refill on voice mail at pharmacy  

## 2017-04-05 NOTE — Telephone Encounter (Signed)
Approved: #60 x 0 

## 2017-04-26 ENCOUNTER — Other Ambulatory Visit: Payer: Self-pay

## 2017-04-26 NOTE — Telephone Encounter (Signed)
Pt left v/m requesting rx hydrocodone apap. Call when ready for pick up. Last printed # 60 on 03/16/17. Last seen 11/16/16.Please advise.

## 2017-04-27 MED ORDER — HYDROCODONE-ACETAMINOPHEN 5-325 MG PO TABS: ORAL_TABLET | ORAL | 0 refills | Status: DC

## 2017-04-27 NOTE — Telephone Encounter (Signed)
Spoke to pt. Rx up front ready for pickup 

## 2017-05-02 ENCOUNTER — Encounter: Payer: Self-pay | Admitting: Family Medicine

## 2017-05-02 ENCOUNTER — Ambulatory Visit (INDEPENDENT_AMBULATORY_CARE_PROVIDER_SITE_OTHER): Payer: BLUE CROSS/BLUE SHIELD | Admitting: Family Medicine

## 2017-05-02 VITALS — BP 102/68 | HR 117 | Temp 98.3°F | Wt 126.0 lb

## 2017-05-02 DIAGNOSIS — J209 Acute bronchitis, unspecified: Secondary | ICD-10-CM

## 2017-05-02 DIAGNOSIS — F17200 Nicotine dependence, unspecified, uncomplicated: Secondary | ICD-10-CM

## 2017-05-02 MED ORDER — GUAIFENESIN-CODEINE 100-10 MG/5ML PO SYRP: 5.0000 mL | ORAL_SOLUTION | Freq: Four times a day (QID) | ORAL | 0 refills | Status: DC | PRN

## 2017-05-02 MED ORDER — BENZONATATE 200 MG PO CAPS: 200.0000 mg | ORAL_CAPSULE | Freq: Three times a day (TID) | ORAL | 0 refills | Status: DC | PRN

## 2017-05-02 MED ORDER — DOXYCYCLINE HYCLATE 100 MG PO TABS: 100.0000 mg | ORAL_TABLET | Freq: Two times a day (BID) | ORAL | 0 refills | Status: DC

## 2017-05-02 NOTE — Assessment & Plan Note (Signed)
Acute bronchitis in a smoker with low grade temps at night  Reassuring exam  May be viral but will cover with doxycycline given smoking status  Fluids/rest Tessalon 200 mg tid prn cough  guifen-codeine prn when not working or driving (disc risk of sedation) Disc symptomatic care - see instructions on AVS  Update if not starting to improve in a week or if worsening  (esp if any wheeze or sob)  She is working on smoking cessation

## 2017-05-02 NOTE — Assessment & Plan Note (Signed)
Disc in detail risks of smoking and possible outcomes including copd, vascular/ heart disease, cancer , respiratory and sinus infections  Pt voices understanding Pt has not had cigarette since Friday and hopes she can quit for good this time   (unsure if ready)

## 2017-05-02 NOTE — Patient Instructions (Signed)
Update if not starting to improve in a week or if worsening  -- if wheezing or short of breath or temperature rises more  Drink lots of fluids I think you have acute bronchitis  Do keep trying to quit smoking-this is a good time to do it  Try the tessalon for cough  Add the guifen- codeine cough syrup as needed when not working or driving  Take the doxycycline as directed

## 2017-05-02 NOTE — Progress Notes (Signed)
Subjective:    Patient ID: Heather Russo, female    DOB: Mar 22, 1966, 51 y.o.   MRN: [id number]  HPI Here for uri symptoms   Started Friday night - with cough  Junky cough- cannot get up phlegm yet  A little runny nose = not as much nasal symptoms  Throat is sore from coughing and inside of her mouth is sore  (irritated)   Temp around 100.0  No otc med  No fever reducers    No sick contacts that she knows of   Temp: 98.3 F (36.8 C)    Current smoker- 1ppd (trying to quit -no cig since Friday)  No known copd or asthma    Patient Active Problem List   Diagnosis Date Noted  . Acute bronchitis 05/02/2017  . Smoker 05/02/2017  . Syncope 08/06/2016  . Special screening for malignant neoplasms, colon 05/28/2016  . Early satiety 05/28/2016  . Esophageal dysphagia 05/18/2016  . MDD (major depressive disorder), recurrent episode, moderate ([place]) 12/24/2015  . Chronic back pain 12/24/2015  . Routine general medical examination at a health care facility 04/03/2014  . Essential tremor 01/22/2011  . ANXIETY, SITUATIONAL 05/14/2010  . COMMON MIGRAINE 04/03/2007  . Essential hypertension, benign 04/03/2007  . ALLERGIC RHINITIS 04/03/2007  . GERD 04/03/2007   Past Medical History:  Diagnosis Date  . Allergic rhinitis   . Allergy   . Anxiety   . Depression   . Depression   . GERD (gastroesophageal reflux disease)   . HLD (hyperlipidemia)    mild  . HTN (hypertension)   . Migraine   . 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 Syncope and collapse   . Tremor, essential    Past Surgical History:  Procedure Laterality Date  . ESOPHAGOGASTRODUODENOSCOPY  05/05/06  . LAPAROSCOPIC CHOLECYSTECTOMY  03/2008  . wisdom teeth removeal     Social History  Substance Use Topics  . Smoking status: Current Every Day Smoker    Packs/day: 1.00    Types: Cigarettes  . Smokeless tobacco: Never Used     Comment: states cut back smoking to1 pack a week.  . Alcohol  use 0.0 oz/week     Comment: rare   Family History  Problem Relation Age of Onset  . Hypertension Father   . Heart attack Father   . Diabetes Father   . Hypertension Mother   . Tremor Mother   . Hypertension Brother   . Diabetes Brother   . Hypertension Brother   . Cancer Paternal Grandmother        Lung  . Tremor Brother   . Colon cancer Neg Hx   . Esophageal cancer Neg Hx   . Pancreatic cancer Neg Hx   . Rectal cancer Neg Hx   . Stomach cancer Neg Hx    Allergies  Allergen Reactions  . Bupropion Hcl     REACTION: rash, nausea, bad taste in mouth, blurred vision  . Lisinopril     REACTION: cough,dizziness  . Mirtazapine     REACTION: unspecified  . Mysoline [Primidone] Hives    Likely cause of hives   Current Outpatient Prescriptions on File Prior to Visit  Medication Sig Dispense Refill  . aspirin 81 MG tablet Take 81 mg by mouth daily.    . busPIRone (BUSPAR) 10 MG tablet Take 1 tablet (10 mg total) by mouth 2 (two) times daily. Start with 1/2 tablet bid for first week only 66  tablet 2  . fexofenadine (ALLEGRA) 180 MG tablet Take 180 mg by mouth daily as needed.      Marland Kitchen FLUoxetine (PROZAC) 20 MG capsule TAKE 1 CAPSULE(20 MG) BY MOUTH DAILY 90 capsule 0  . gabapentin (NEURONTIN) 100 MG capsule Take 2 capsules by mouth 3 (three) times daily.    Marland Kitchen HYDROcodone-acetaminophen (NORCO/VICODIN) 5-325 MG tablet TAKE 1 TABLET BY MOUTH EVERY 4 HOURS AS NEEDED FOR PAIN 60 tablet 0  . Multiple Vitamin (MULTIVITAMIN) tablet Take 1 tablet by mouth daily.    . Omega-3 Fatty Acids (FISH OIL) 1000 MG CAPS Take by mouth daily.    . pantoprazole (PROTONIX) 40 MG tablet TAKE 1 TABLET(40 MG) BY MOUTH DAILY 90 tablet 1  . venlafaxine XR (EFFEXOR-XR) 75 MG 24 hr capsule TAKE 3 CAPSULES BY MOUTH EVERY DAY 90 capsule 1   No current facility-administered medications on file prior to visit.     Review of Systems  Constitutional: Positive for appetite change and fatigue. Negative for fever.    HENT: Positive for postnasal drip, rhinorrhea, sneezing and sore throat. Negative for congestion, ear pain and sinus pressure.   Eyes: Negative for pain and discharge.  Respiratory: Positive for cough. Negative for chest tightness, shortness of breath, wheezing and stridor.   Cardiovascular: Negative for chest pain.  Gastrointestinal: Negative for diarrhea, nausea and vomiting.  Genitourinary: Negative for frequency, hematuria and urgency.  Musculoskeletal: Negative for arthralgias and myalgias.  Skin: Negative for rash.  Neurological: Positive for headaches. Negative for dizziness, weakness and light-headedness.  Psychiatric/Behavioral: Negative for confusion and dysphoric mood.       Objective:   Physical Exam  Constitutional: She appears well-developed and well-nourished. No distress.  Well appearing but fatigued  Persistent hacking cough  HENT:  Head: Normocephalic and atraumatic.  Right Ear: External ear normal.  Left Ear: External ear normal.  Mouth/Throat: Oropharynx is clear and moist.  Nares are injected and congested  No sinus tenderness Clear rhinorrhea and post nasal drip   Eyes: Conjunctivae and EOM are normal. Pupils are equal, round, and reactive to light. Right eye exhibits no discharge. Left eye exhibits no discharge.  Neck: Normal range of motion. Neck supple.  Cardiovascular: Normal rate and normal heart sounds.   Pulmonary/Chest: Effort normal and breath sounds normal. No respiratory distress. She has no wheezes. She has no rales. She exhibits no tenderness.  Persistent junky sounding cough  Diffusely distant bs  Upper airway sounds heard Few scattered rhonchi No rales No wheeze or prolonged exp phase  Not sob    Lymphadenopathy:    She has no cervical adenopathy.  Neurological: She is alert.  Skin: Skin is warm and dry. No rash noted.  Psychiatric: She has a normal mood and affect.          Assessment & Plan:   Problem List Items Addressed  This Visit      Respiratory   Acute bronchitis    Acute bronchitis in a smoker with low grade temps at night  Reassuring exam  May be viral but will cover with doxycycline given smoking status  Fluids/rest Tessalon 200 mg tid prn cough  guifen-codeine prn when not working or driving (disc risk of sedation) Disc symptomatic care - see instructions on AVS  Update if not starting to improve in a week or if worsening  (esp if any wheeze or sob)  She is working on smoking cessation        Other   Smoker  Disc in detail risks of smoking and possible outcomes including copd, vascular/ heart disease, cancer , respiratory and sinus infections  Pt voices understanding Pt has not had cigarette since Friday and hopes she can quit for good this time   (unsure if ready)

## 2017-05-12 ENCOUNTER — Other Ambulatory Visit: Payer: Self-pay | Admitting: Internal Medicine

## 2017-05-17 ENCOUNTER — Encounter: Payer: Self-pay | Admitting: Internal Medicine

## 2017-05-17 ENCOUNTER — Ambulatory Visit (INDEPENDENT_AMBULATORY_CARE_PROVIDER_SITE_OTHER): Payer: BLUE CROSS/BLUE SHIELD | Admitting: Internal Medicine

## 2017-05-17 VITALS — BP 92/70 | HR 90 | Temp 98.0°F | Wt 127.0 lb

## 2017-05-17 DIAGNOSIS — G8929 Other chronic pain: Secondary | ICD-10-CM | POA: Diagnosis not present

## 2017-05-17 MED ORDER — HYDROCODONE-ACETAMINOPHEN 5-325 MG PO TABS: 1.0000 | ORAL_TABLET | Freq: Four times a day (QID) | ORAL | 0 refills | Status: DC | PRN

## 2017-05-17 NOTE — Patient Instructions (Signed)
Please read about [place]'s STOP act.

## 2017-05-17 NOTE — Progress Notes (Signed)
Subjective:    Patient ID: Heather Russo, female    DOB: 04-14-1966, 51 y.o.   MRN: [id number]  HPI Here for follow up -- mostly for chronic pain management  Mostly better from the bronchitis Still coughing Hasn't smoked in 3 weeks--really trying to stop  Still uses the hydrocodone regularly Has to sit down due to severe pain if she is on her feet for awhile Better if she sits--then recurs Still takes it bid  Current Outpatient Prescriptions on File Prior to Visit  Medication Sig Dispense Refill  . benzonatate (TESSALON) 200 MG capsule Take 1 capsule (200 mg total) by mouth 3 (three) times daily as needed for cough. Swallow whole, do not bite pill 30 capsule 0  . busPIRone (BUSPAR) 10 MG tablet Take 1 tablet (10 mg total) by mouth 2 (two) times daily. Start with 1/2 tablet bid for first week only 60 tablet 2  . fexofenadine (ALLEGRA) 180 MG tablet Take 180 mg by mouth daily as needed.      Marland Kitchen FLUoxetine (PROZAC) 20 MG capsule TAKE 1 CAPSULE(20 MG) BY MOUTH DAILY 90 capsule 0  . gabapentin (NEURONTIN) 100 MG capsule Take 2 capsules by mouth 3 (three) times daily.    Marland Kitchen guaiFENesin-codeine (ROBITUSSIN AC) 100-10 MG/5ML syrup Take 5 mLs by mouth 4 (four) times daily as needed for cough. When not working or driving 407 mL 0  . HYDROcodone-acetaminophen (NORCO/VICODIN) 5-325 MG tablet TAKE 1 TABLET BY MOUTH EVERY 4 HOURS AS NEEDED FOR PAIN 60 tablet 0  . Multiple Vitamin (MULTIVITAMIN) tablet Take 1 tablet by mouth daily.    . pantoprazole (PROTONIX) 40 MG tablet TAKE 1 TABLET(40 MG) BY MOUTH DAILY 90 tablet 1  . venlafaxine XR (EFFEXOR-XR) 75 MG 24 hr capsule TAKE 3 CAPSULES BY MOUTH EVERY DAY 90 capsule 0   No current facility-administered medications on file prior to visit.     Allergies  Allergen Reactions  . Bupropion Hcl     REACTION: rash, nausea, bad taste in mouth, blurred vision  . Lisinopril     REACTION: cough,dizziness  . Mirtazapine     REACTION: unspecified  .  Mysoline [Primidone] Hives    Likely cause of hives    Past Medical History:  Diagnosis Date  . Allergic rhinitis   . Allergy   . Anxiety   . Depression   . Depression   . GERD (gastroesophageal reflux disease)   . HLD (hyperlipidemia)    mild  . HTN (hypertension)   . Migraine   . 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 Syncope and collapse   . Tremor, essential     Past Surgical History:  Procedure Laterality Date  . ESOPHAGOGASTRODUODENOSCOPY  05/05/06  . LAPAROSCOPIC CHOLECYSTECTOMY  03/2008  . wisdom teeth removeal      Family History  Problem Relation Age of Onset  . Hypertension Father   . Heart attack Father   . Diabetes Father   . Hypertension Mother   . Tremor Mother   . Hypertension Brother   . Diabetes Brother   . Hypertension Brother   . Cancer Paternal Grandmother        Lung  . Tremor Brother   . Colon cancer Neg Hx   . Esophageal cancer Neg Hx   . Pancreatic cancer Neg Hx   . Rectal cancer Neg Hx   . Stomach cancer Neg Hx     Social History  Social History  . Marital status: Widowed    Spouse name: N/A  . Number of children: 1  . Years of education: N/A   Occupational History  .  Inventory clerk-- laid off 2015         Social History Main Topics  . Smoking status: Current Every Day Smoker    Packs/day: 1.00    Types: Cigarettes  . Smokeless tobacco: Never Used     Comment: states cut back smoking to1 pack a week.  . Alcohol use 0.0 oz/week     Comment: rare  . Drug use: No  . Sexual activity: Not on file   Other Topics Concern  . Not on file   Social History Narrative   Widowed 10/12   Engaged with 2 year relationship--but he died 2023-09-08   Review of Systems  Sleeping some better Depression persists--but not severe     Objective:   Physical Exam  Constitutional: She appears well-nourished. No distress.  Psychiatric: She has a normal mood and affect. Her behavior is normal.            Assessment & Plan:

## 2017-05-17 NOTE — Assessment & Plan Note (Signed)
Continues on hydrocodone regularly Now off lorazepam and on clonazepam for her tremors Zolpidem at night CSRS checked--- Dr Melrose Nakayama gives zolpidem and clonazepam. Narcotics only from this office

## 2017-05-24 ENCOUNTER — Other Ambulatory Visit: Payer: Self-pay | Admitting: Internal Medicine

## 2017-06-15 ENCOUNTER — Other Ambulatory Visit: Payer: Self-pay | Admitting: Neurology

## 2017-06-15 DIAGNOSIS — R519 Headache, unspecified: Secondary | ICD-10-CM

## 2017-06-15 DIAGNOSIS — R251 Tremor, unspecified: Secondary | ICD-10-CM

## 2017-06-15 DIAGNOSIS — R51 Headache: Secondary | ICD-10-CM

## 2017-06-22 ENCOUNTER — Other Ambulatory Visit: Payer: Self-pay | Admitting: Internal Medicine

## 2017-06-24 ENCOUNTER — Ambulatory Visit
Admission: RE | Admit: 2017-06-24 | Discharge: 2017-06-24 | Disposition: A | Discharge status: Home / Self Care | Payer: BLUE CROSS/BLUE SHIELD | Source: Ambulatory Visit | Attending: Neurology | Admitting: Neurology

## 2017-06-24 DIAGNOSIS — R51 Headache: Secondary | ICD-10-CM | POA: Insufficient documentation

## 2017-06-24 DIAGNOSIS — H539 Unspecified visual disturbance: Secondary | ICD-10-CM | POA: Insufficient documentation

## 2017-06-24 DIAGNOSIS — R930 Abnormal findings on diagnostic imaging of skull and head, not elsewhere classified: Secondary | ICD-10-CM | POA: Diagnosis not present

## 2017-06-24 DIAGNOSIS — R251 Tremor, unspecified: Secondary | ICD-10-CM | POA: Diagnosis not present

## 2017-06-24 DIAGNOSIS — R519 Headache, unspecified: Secondary | ICD-10-CM

## 2017-06-24 MED ORDER — GADOBENATE DIMEGLUMINE 529 MG/ML IV SOLN
10.0000 mL | Freq: Once | INTRAVENOUS | Status: AC | PRN
  Administered 2017-06-24: 10 mL via INTRAVENOUS

## 2017-08-01 ENCOUNTER — Other Ambulatory Visit: Payer: Self-pay

## 2017-08-01 MED ORDER — HYDROCODONE-ACETAMINOPHEN 5-325 MG PO TABS: 1.0000 | ORAL_TABLET | Freq: Four times a day (QID) | ORAL | 0 refills | Status: DC | PRN

## 2017-08-01 NOTE — Telephone Encounter (Signed)
Pt left v/m requesting rx hydrocodone apap. Call when ready for pick up. rx last printed for # 30 and # 60 on 05/17/17. Pt last seen 05/17/17.

## 2017-08-01 NOTE — Telephone Encounter (Signed)
Spoke to pt and informed her Rx is available for pickup from the front desk 

## 2017-08-17 ENCOUNTER — Ambulatory Visit (INDEPENDENT_AMBULATORY_CARE_PROVIDER_SITE_OTHER): Payer: BLUE CROSS/BLUE SHIELD | Admitting: Internal Medicine

## 2017-08-17 ENCOUNTER — Encounter: Payer: Self-pay | Admitting: Internal Medicine

## 2017-08-17 VITALS — BP 108/78 | HR 77 | Temp 98.1°F | Wt 131.0 lb

## 2017-08-17 DIAGNOSIS — M549 Dorsalgia, unspecified: Secondary | ICD-10-CM | POA: Diagnosis not present

## 2017-08-17 DIAGNOSIS — G8929 Other chronic pain: Secondary | ICD-10-CM | POA: Diagnosis not present

## 2017-08-17 DIAGNOSIS — Z23 Encounter for immunization: Secondary | ICD-10-CM

## 2017-08-17 MED ORDER — HYDROCODONE-ACETAMINOPHEN 5-325 MG PO TABS: 1.0000 | ORAL_TABLET | Freq: Four times a day (QID) | ORAL | 0 refills | Status: DC | PRN

## 2017-08-17 NOTE — Patient Instructions (Signed)
Please use the back brace at work. Ice your back after work--and consider heat before. You may benefit from an inversion table

## 2017-08-17 NOTE — Assessment & Plan Note (Signed)
Ongoing pain Discussed options for non pharmacologic Rx

## 2017-08-17 NOTE — Addendum Note (Signed)
Addended by: Pilar Grammes on: 08/17/2017 02:40 PM   Modules accepted: Orders

## 2017-08-17 NOTE — Progress Notes (Signed)
Subjective:    Patient ID: Heather Russo, female    DOB: May 06, 1966, 51 y.o.   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 busPIRone (BUSPAR) 10 MG tablet Take 1 tablet (10 mg total) by mouth 2 (two) times daily. Start with 1/2 tablet bid for first week only 60 tablet 2  . fexofenadine (ALLEGRA) 180 MG tablet Take 180 mg by mouth daily as needed.      Marland Kitchen FLUoxetine (PROZAC) 20 MG capsule TAKE 1 CAPSULE(20 MG) BY MOUTH DAILY 90 capsule 1  . gabapentin (NEURONTIN) 100 MG capsule Take 2 capsules by mouth 3 (three) times daily.    Marland Kitchen HYDROcodone-acetaminophen (NORCO/VICODIN) 5-325 MG tablet Take 1 tablet by mouth every 6 (six) hours as needed for moderate pain. 60 tablet 0  . Multiple Vitamin (MULTIVITAMIN) tablet Take 1 tablet by mouth daily.    . pantoprazole (PROTONIX) 40 MG tablet TAKE 1 TABLET(40 MG) BY MOUTH DAILY 90 tablet 1  . venlafaxine XR (EFFEXOR-XR) 75 MG 24 hr capsule TAKE 3 CAPSULES BY MOUTH EVERY DAY 90 capsule 1  . clonazePAM (KLONOPIN) 0.5 MG tablet Take 1 tablet by mouth 2 (two) times daily as needed.     No current facility-administered medications on file prior to visit.     Allergies  Allergen Reactions  . Bupropion Hcl     REACTION: rash, nausea, bad taste in mouth, blurred vision  . Lisinopril     REACTION: cough,dizziness  . Mirtazapine     REACTION: unspecified  . Mysoline [Primidone] Hives    Likely cause of hives    Past Medical History:  Diagnosis Date  . Allergic  rhinitis   . Allergy   . Anxiety   . Depression   . Depression   . GERD (gastroesophageal reflux disease)   . HLD (hyperlipidemia)    mild  . HTN (hypertension)   . Migraine   . 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 Syncope and collapse   . Tremor, essential     Past Surgical History:  Procedure Laterality Date  . ESOPHAGOGASTRODUODENOSCOPY  05/05/06  . LAPAROSCOPIC CHOLECYSTECTOMY  03/2008  . wisdom teeth removeal      Family History  Problem Relation Age of Onset  . Hypertension Father   . Heart attack Father   . Diabetes Father   . Hypertension Mother   . Tremor Mother   . Hypertension Brother   . Diabetes Brother   . Hypertension Brother   . Cancer Paternal Grandmother        Lung  . Tremor Brother   . Colon cancer Neg Hx   . Esophageal cancer Neg Hx   . Pancreatic cancer Neg Hx   . Rectal cancer Neg Hx   . Stomach cancer Neg Hx     Social History   Social History  . Marital status: Widowed    Spouse name: N/A  . Number of children: 1  . Years of education: N/A   Occupational History  .  Inventory clerk-- laid off 2015        . Engineer, materials    Social History Main Topics  . Smoking status: Current Every Day Smoker    Packs/day: 1.00    Types: Cigarettes  . Smokeless tobacco: Never Used     Comment: states cut back smoking to1 pack a week.  . Alcohol use 0.0 oz/week     Comment: rare  . Drug use: No  . Sexual activity: Not on file   Other Topics Concern  . Not on file   Social History Narrative   Widowed 10/12   Engaged with 2 year relationship--but he died 06-Oct-2023   Review of Systems Has to get up 4AM now---trouble adjusting schedule Appetite is okay    Objective:   Physical Exam  Constitutional: No distress.  Psychiatric: She has a normal mood and affect. Her behavior is normal.          Assessment & Plan:

## 2017-08-17 NOTE — Assessment & Plan Note (Signed)
Having more pain with her new job standing on concrete Discussed---will not increase narcotic She will use back brace Consider inversion table Ice after work, heat otherwise Reviewed CSRS--- sedative/hypnotics from neurologist. Nothing unexepected

## 2017-08-25 ENCOUNTER — Other Ambulatory Visit: Payer: Self-pay | Admitting: Internal Medicine

## 2017-09-29 ENCOUNTER — Other Ambulatory Visit: Payer: Self-pay | Admitting: Internal Medicine

## 2017-09-29 NOTE — Telephone Encounter (Signed)
Copied from [place] [id number]. Topic: Inquiry >> Sep 29, 2017  2:18 PM Oliver Pila B wrote: Reason for CRM: PT is needing a VICODIN refill, please contact and advise

## 2017-09-29 NOTE — Telephone Encounter (Signed)
Last OV and Rx 08/17/2017.

## 2017-09-30 MED ORDER — HYDROCODONE-ACETAMINOPHEN 5-325 MG PO TABS: 1.0000 | ORAL_TABLET | Freq: Four times a day (QID) | ORAL | 0 refills | Status: DC | PRN

## 2017-09-30 NOTE — Telephone Encounter (Signed)
Left message on vm per DPR that rx is up front ready for pickup.

## 2017-10-05 ENCOUNTER — Other Ambulatory Visit: Payer: Self-pay | Admitting: Internal Medicine

## 2017-10-07 ENCOUNTER — Other Ambulatory Visit: Payer: Self-pay | Admitting: *Deleted

## 2017-10-07 ENCOUNTER — Telehealth: Payer: Self-pay

## 2017-10-07 ENCOUNTER — Inpatient Hospital Stay
Admission: RE | Admit: 2017-10-07 | Discharge: 2017-10-07 | Disposition: A | Discharge status: Home / Self Care | Payer: Self-pay | Source: Ambulatory Visit | Attending: *Deleted | Admitting: *Deleted

## 2017-10-07 DIAGNOSIS — N6459 Other signs and symptoms in breast: Secondary | ICD-10-CM

## 2017-10-07 DIAGNOSIS — Z9289 Personal history of other medical treatment: Secondary | ICD-10-CM

## 2017-10-07 NOTE — Telephone Encounter (Signed)
Spoke to pt. She said she is having sensitivity in the nipples of both breasts. [facility] is requiring she have a Diagnostic Mammogram

## 2017-10-07 NOTE — Telephone Encounter (Signed)
Copied from [place] [phone]. Topic: Inquiry >> Oct 07, 2017  1:29 PM Ether Griffins B wrote: Reason for CRM:  Pt trying to set up a mammo appt at [facility] and they are needing the doc to send paperwork to that office so they can schedule her for the appt.

## 2017-10-08 NOTE — Telephone Encounter (Signed)
Order placed

## 2017-10-19 ENCOUNTER — Other Ambulatory Visit: Payer: Self-pay | Admitting: Internal Medicine

## 2017-10-19 DIAGNOSIS — N6459 Other signs and symptoms in breast: Secondary | ICD-10-CM

## 2017-10-26 ENCOUNTER — Other Ambulatory Visit: Payer: Self-pay | Admitting: Internal Medicine

## 2017-10-31 ENCOUNTER — Other Ambulatory Visit: Payer: BLUE CROSS/BLUE SHIELD

## 2017-11-07 ENCOUNTER — Other Ambulatory Visit: Payer: Self-pay | Admitting: Internal Medicine

## 2017-11-07 MED ORDER — HYDROCODONE-ACETAMINOPHEN 5-325 MG PO TABS: 1.0000 | ORAL_TABLET | Freq: Four times a day (QID) | ORAL | 0 refills | Status: DC | PRN

## 2017-11-07 NOTE — Telephone Encounter (Signed)
Pt requesting rx hydrocodone apap. Call when ready for pick up. Last printed # 60 on 09/30/17. Last seen 08/17/17. Last UDS 01/28/17.

## 2017-11-07 NOTE — Telephone Encounter (Signed)
Spoke to pt. Rx up front ready for pickup 

## 2017-11-07 NOTE — Telephone Encounter (Signed)
Copied from [place] [id number]. Topic: Quick Communication - See Telephone Encounter >> Nov 07, 2017 10:44 AM Ether Griffins B wrote: CRM for notification. See Telephone encounter for:  Pt needing refill on vicodin pt would like a call once its ready for pick up. Pt is going to be in the area tmrw and would like to pick it up then if possible  11/07/17.

## 2017-11-08 ENCOUNTER — Ambulatory Visit
Admission: RE | Admit: 2017-11-08 | Discharge: 2017-11-08 | Disposition: A | Discharge status: Home / Self Care | Payer: BLUE CROSS/BLUE SHIELD | Source: Ambulatory Visit | Attending: Internal Medicine | Admitting: Internal Medicine

## 2017-11-08 DIAGNOSIS — N6459 Other signs and symptoms in breast: Secondary | ICD-10-CM | POA: Diagnosis not present

## 2017-11-08 HISTORY — DX: Malignant (primary) neoplasm, unspecified: C80.1

## 2017-11-09 ENCOUNTER — Encounter: Payer: Self-pay | Admitting: *Deleted

## 2017-11-10 ENCOUNTER — Ambulatory Visit: Payer: BLUE CROSS/BLUE SHIELD | Admitting: Internal Medicine

## 2017-11-26 ENCOUNTER — Other Ambulatory Visit: Payer: Self-pay | Admitting: Internal Medicine

## 2017-11-27 ENCOUNTER — Emergency Department: Payer: BLUE CROSS/BLUE SHIELD

## 2017-11-27 ENCOUNTER — Emergency Department
Admission: EM | Admit: 2017-11-27 | Discharge: 2017-11-27 | Disposition: A | Discharge status: Home / Self Care | Payer: BLUE CROSS/BLUE SHIELD | Attending: Emergency Medicine | Admitting: Emergency Medicine

## 2017-11-27 ENCOUNTER — Other Ambulatory Visit: Payer: Self-pay

## 2017-11-27 ENCOUNTER — Encounter: Payer: Self-pay | Admitting: Emergency Medicine

## 2017-11-27 DIAGNOSIS — R6883 Chills (without fever): Secondary | ICD-10-CM | POA: Insufficient documentation

## 2017-11-27 DIAGNOSIS — I10 Essential (primary) hypertension: Secondary | ICD-10-CM | POA: Insufficient documentation

## 2017-11-27 DIAGNOSIS — Z85828 Personal history of other malignant neoplasm of skin: Secondary | ICD-10-CM | POA: Insufficient documentation

## 2017-11-27 DIAGNOSIS — N390 Urinary tract infection, site not specified: Secondary | ICD-10-CM

## 2017-11-27 DIAGNOSIS — B349 Viral infection, unspecified: Secondary | ICD-10-CM | POA: Insufficient documentation

## 2017-11-27 DIAGNOSIS — Z79899 Other long term (current) drug therapy: Secondary | ICD-10-CM | POA: Insufficient documentation

## 2017-11-27 DIAGNOSIS — F17210 Nicotine dependence, cigarettes, uncomplicated: Secondary | ICD-10-CM | POA: Insufficient documentation

## 2017-11-27 LAB — COMPREHENSIVE METABOLIC PANEL
ALT: 22 U/L (ref 14–54)
AST: 28 U/L (ref 15–41)
Albumin: 4.5 g/dL (ref 3.5–5.0)
Alkaline Phosphatase: 108 U/L (ref 38–126)
Anion gap: 12 (ref 5–15)
BUN: 15 mg/dL (ref 6–20)
CO2: 21 mmol/L — ABNORMAL LOW (ref 22–32)
Calcium: 9.5 mg/dL (ref 8.9–10.3)
Chloride: 105 mmol/L (ref 101–111)
Creatinine, Ser: 0.83 mg/dL (ref 0.44–1.00)
GFR calc Af Amer: >60 mL/min (ref >60)
GFR calc non Af Amer: >60 mL/min (ref >60)
Glucose, Bld: 119 mg/dL — ABNORMAL HIGH (ref 65–99)
Potassium: 3.5 mmol/L (ref 3.5–5.1)
Sodium: 138 mmol/L (ref 135–145)
Total Bilirubin: 0.7 mg/dL (ref 0.3–1.2)
Total Protein: 8.1 g/dL (ref 6.5–8.1)

## 2017-11-27 LAB — URINALYSIS, COMPLETE (UACMP) WITH MICROSCOPIC
Bacteria, UA: NONE SEEN
Bilirubin Urine: NEGATIVE
Glucose, UA: NEGATIVE mg/dL
Ketones, ur: 5 mg/dL — AB
Nitrite: NEGATIVE
Protein, ur: NEGATIVE mg/dL
Specific Gravity, Urine: 1.023 (ref 1.005–1.030)
pH: 5 (ref 5.0–8.0)

## 2017-11-27 LAB — CBC WITH DIFFERENTIAL/PLATELET
Basophils Absolute: 0.1 10*3/uL (ref 0–0.1)
Basophils Relative: 1 %
Eosinophils Absolute: 0.1 10*3/uL (ref 0–0.7)
Eosinophils Relative: 1 %
HCT: 49.1 % — ABNORMAL HIGH (ref 35.0–47.0)
Hemoglobin: 16.4 g/dL — ABNORMAL HIGH (ref 12.0–16.0)
Lymphocytes Relative: 28 %
Lymphs Abs: 3.2 10*3/uL (ref 1.0–3.6)
MCH: 29.3 pg (ref 26.0–34.0)
MCHC: 33.3 g/dL (ref 32.0–36.0)
MCV: 88 fL (ref 80.0–100.0)
Monocytes Absolute: 0.6 10*3/uL (ref 0.2–0.9)
Monocytes Relative: 5 %
Neutro Abs: 7.6 10*3/uL — ABNORMAL HIGH (ref 1.4–6.5)
Neutrophils Relative %: 65 %
Platelets: 367 10*3/uL (ref 150–440)
RBC: 5.58 MIL/uL — ABNORMAL HIGH (ref 3.80–5.20)
RDW: 13.6 % (ref 11.5–14.5)
WBC: 11.6 10*3/uL — ABNORMAL HIGH (ref 3.6–11.0)

## 2017-11-27 MED ORDER — CIPROFLOXACIN HCL 500 MG PO TABS: 500.0000 mg | ORAL_TABLET | Freq: Two times a day (BID) | ORAL | 0 refills | Status: DC

## 2017-11-27 MED ORDER — BENZONATATE 100 MG PO CAPS: 200.0000 mg | ORAL_CAPSULE | Freq: Three times a day (TID) | ORAL | 0 refills | Status: DC | PRN

## 2017-11-27 MED ORDER — DEXTROSE 5 % IV SOLN
1.0000 g | Freq: Once | INTRAVENOUS | Status: AC
  Administered 2017-11-27: 1 g via INTRAVENOUS
  Filled 2017-11-27: qty 10

## 2017-11-27 MED ORDER — SODIUM CHLORIDE 0.9 % IV BOLUS (SEPSIS)
1000.0000 mL | Freq: Once | INTRAVENOUS | Status: AC
  Administered 2017-11-27: 1000 mL via INTRAVENOUS

## 2017-11-27 MED ORDER — ACETAMINOPHEN 500 MG PO TABS
1000.0000 mg | ORAL_TABLET | Freq: Once | ORAL | Status: AC
  Administered 2017-11-27: 1000 mg via ORAL
  Filled 2017-11-27: qty 2

## 2017-11-27 NOTE — ED Notes (Signed)
Pt reports that she has been feeling bad for four or five day - denies nausea or vomiting  Diarrhea this am

## 2017-11-27 NOTE — ED Notes (Signed)

## 2017-11-27 NOTE — Discharge Instructions (Signed)
Begin taking antibiotics as directed. Also Tessalon Perles as needed for cough. Increase fluids at home. Tylenol if needed for body aches or fever. Follow-up with her primary care for recheck of your blood pressure and if not improving.

## 2017-11-27 NOTE — ED Triage Notes (Signed)
Pt to ED v/o POV c/o Diarrhea, chills, cough, chest pain, and body aches. Pt states that she is unsure if she has had fever. Pt has had symptoms 5-6 days.

## 2017-11-27 NOTE — ED Provider Notes (Signed)
[facility] Emergency Department Provider Note  ____________________________________________   First MD Initiated Contact with Patient 11/27/17 1249     (approximate)  I have reviewed the triage vital signs and the nursing notes.   HISTORY  Chief Complaint No chief complaint on file.    HPI Heather Russo is a 52 y.o. female is here with complaint of diarrhea, chills, cough, body aches for the last 5-6 days. Patient is unsure if she has had fever. She denies any vomiting. Patient has decreased intake due to feeling bad. No other family members at this time have similar symptoms.patient states that in the last 24 hours she has probably had at least 6-10 diarrhea stools.  Family member states that her PCP called and a prescription for amoxicillin which she took for approximately 5 days. She began doing much better and discontinued taking it when she began feeling much worse.she has not taken any in several days.   Past Medical History:  Diagnosis Date  . Allergic rhinitis   . Allergy   . Anxiety   . Cancer ([place])    skin  . Depression   . Depression   . GERD (gastroesophageal reflux disease)   . HLD (hyperlipidemia)    mild  . HTN (hypertension)   . Migraine   . 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 Syncope and collapse   . Tremor, essential     Patient Active Problem List   Diagnosis Date Noted  . Encounter for chronic pain management 05/17/2017  . Acute bronchitis 05/02/2017  . Smoker 05/02/2017  . Syncope 08/06/2016  . Special screening for malignant neoplasms, colon 05/28/2016  . Early satiety 05/28/2016  . Esophageal dysphagia 05/18/2016  . MDD (major depressive disorder), recurrent episode, moderate ([place]) 12/24/2015  . Chronic back pain 12/24/2015  . Routine general medical examination at a health care facility 04/03/2014  . Essential tremor 01/22/2011  . ANXIETY, SITUATIONAL 05/14/2010  . COMMON MIGRAINE  04/03/2007  . Essential hypertension, benign 04/03/2007  . ALLERGIC RHINITIS 04/03/2007  . GERD 04/03/2007    Past Surgical History:  Procedure Laterality Date  . ESOPHAGOGASTRODUODENOSCOPY  05/05/06  . LAPAROSCOPIC CHOLECYSTECTOMY  03/2008  . wisdom teeth removeal      Prior to Admission medications   Medication Sig Start Date End Date Taking? Authorizing Provider  benzonatate (TESSALON PERLES) 100 MG capsule Take 2 capsules (200 mg total) by mouth 3 (three) times daily as needed. 11/27/17 11/27/18  Johnn Hai, PA-C  busPIRone (BUSPAR) 10 MG tablet Take 1 tablet (10 mg total) by mouth 2 (two) times daily. Start with 1/2 tablet bid for first week only 06/07/16   Gatha Mayer, MD  ciprofloxacin (CIPRO) 500 MG tablet Take 1 tablet (500 mg total) by mouth 2 (two) times daily. 11/27/17   Johnn Hai, PA-C  clonazePAM (KLONOPIN) 0.5 MG tablet Take 1 tablet by mouth 2 (two) times daily as needed. 04/26/17 05/26/17  [provider]  fexofenadine (ALLEGRA) 180 MG tablet Take 180 mg by mouth daily as needed.      [provider]  FLUoxetine (PROZAC) 20 MG capsule TAKE 1 CAPSULE(20 MG) BY MOUTH DAILY 05/24/17   [name], MD  gabapentin (NEURONTIN) 100 MG capsule Take 2 capsules by mouth 3 (three) times daily. 11/08/16   [provider]  HYDROcodone-acetaminophen (NORCO/VICODIN) 5-325 MG tablet Take 1 tablet by mouth every 6 (six) hours as needed for moderate  pain. 11/07/17   [name], MD  Multiple Vitamin (MULTIVITAMIN) tablet Take 1 tablet by mouth daily.    [provider]  pantoprazole (PROTONIX) 40 MG tablet TAKE 1 TABLET(40 MG) BY MOUTH DAILY 05/24/17   [name], MD  traZODone (DESYREL) 50 MG tablet Take 1/2-1 tab nightly 07/29/17   [provider]  venlafaxine XR (EFFEXOR-XR) 75 MG 24 hr capsule TAKE 3 CAPSULES BY MOUTH EVERY DAY 10/26/17   [name], MD    Allergies Bupropion hcl; Lisinopril; Mirtazapine; and  Mysoline [primidone]  Family History  Problem Relation Age of Onset  . Hypertension Father   . Heart attack Father   . Diabetes Father   . Hypertension Mother   . Tremor Mother   . Hypertension Brother   . Diabetes Brother   . Hypertension Brother   . Cancer Paternal Grandmother        Lung  . Tremor Brother   . Colon cancer Neg Hx   . Esophageal cancer Neg Hx   . Pancreatic cancer Neg Hx   . Rectal cancer Neg Hx   . Stomach cancer Neg Hx   . Breast cancer Neg Hx     Social History Social History   Tobacco Use  . Smoking status: Current Every Day Smoker    Packs/day: 1.00    Types: Cigarettes  . Smokeless tobacco: Never Used  . Tobacco comment: states cut back smoking to1 pack a week.  Substance Use Topics  . Alcohol use: Yes    Alcohol/week: 0.0 oz    Comment: rare  . Drug use: No    Review of Systems Constitutional: subjective fever/chills Eyes: No visual changes. ENT: No sore throat. Cardiovascular: Denies chest pain. Respiratory: Denies shortness of breath. Positive for cough. Gastrointestinal: No abdominal pain.  No nausea, no vomiting.  positive diarrhea.   Genitourinary: Negative for dysuria. Musculoskeletal: positive for body aches. Skin: Negative for rash. Neurological: Negative for headaches, focal weakness or numbness. ____________________________________________   PHYSICAL EXAM:  VITAL SIGNS: ED Triage Vitals  Enc Vitals Group     BP 11/27/17 1218 (!) 144/107     Pulse Rate 11/27/17 1218 (!) 121     Resp 11/27/17 1218 16     Temp 11/27/17 1218 98.4 F (36.9 C)     Temp Source 11/27/17 1218 Oral     SpO2 11/27/17 1218 99 %     Weight 11/27/17 1220 130 lb (59 kg)     Height 11/27/17 1220 5\' 1"  (1.549 m)     Head Circumference --      Peak Flow --      Pain Score 11/27/17 1223 10     Pain Loc --      Pain Edu? --      Excl. in [place]? --    Constitutional: Alert and oriented. Well appearing and in no acute distress. Eyes: Conjunctivae  are normal. PERRL. EOMI. Head: Atraumatic. Nose: No congestion/rhinnorhea. Mouth/Throat: Mucous membranes are moist.  Oropharynx non-erythematous. Neck: No stridor.   Hematological/Lymphatic/Immunilogical: No cervical lymphadenopathy. Cardiovascular: Normal rate, regular rhythm. Grossly normal heart sounds.  Good peripheral circulation. Respiratory: Normal respiratory effort.  No retractions. Lungs CTAB. Gastrointestinal: Soft and nontender. No distention. Musculoskeletal: No lower extremity tenderness nor edema.  No joint effusions. Neurologic:  Normal speech and language. No gross focal neurologic deficits are appreciated. No gait instability. Skin:  Skin is warm, dry and intact. No rash noted. Psychiatric: Mood and affect are normal. Speech  and behavior are normal.  ____________________________________________   LABS (all labs ordered are listed, but only abnormal results are displayed)  Labs Reviewed  CBC WITH DIFFERENTIAL/PLATELET - Abnormal; Notable for the following components:      Result Value   WBC 11.6 (*)    RBC 5.58 (*)    Hemoglobin 16.4 (*)    HCT 49.1 (*)    Neutro Abs 7.6 (*)    All other components within normal limits  COMPREHENSIVE METABOLIC PANEL - Abnormal; Notable for the following components:   CO2 21 (*)    Glucose, Bld 119 (*)    All other components within normal limits  URINALYSIS, COMPLETE (UACMP) WITH MICROSCOPIC - Abnormal; Notable for the following components:   Color, Urine YELLOW (*)    APPearance HAZY (*)    Hgb urine dipstick MODERATE (*)    Ketones, ur 5 (*)    Leukocytes, UA SMALL (*)    Squamous Epithelial / LPF 0-5 (*)    All other components within normal limits    RADIOLOGY  Dg Chest 2 View  Result Date: 11/27/2017 CLINICAL DATA:  Cough with chills and chest pain EXAM: CHEST  2 VIEW COMPARISON:  08/28/2014 FINDINGS: Mild hyperexpansion. The lungs are clear without focal pneumonia, edema, pneumothorax or pleural effusion.  Interstitial markings are diffusely coarsened with chronic features. The cardiopericardial silhouette is within normal limits for size. The visualized bony structures of the thorax are intact. IMPRESSION: No active cardiopulmonary disease. Electronically Signed   By: Misty Stanley M.D.   On: 11/27/2017 14:41    ____________________________________________   PROCEDURES  Procedure(s) performed: None  Procedures  Critical Care performed: No  ____________________________________________   INITIAL IMPRESSION / ASSESSMENT AND PLAN / ED COURSE Patient was given bolus of normal saline while waiting for laboratory report. Patient was made aware that chest x-ray was negative. Urinalysis showed evidence of a UTI for which she may have been taken amoxicillin for. Patient was given Rocephin 1 g IV while in the department. She is to increase fluids.  Patient is given a prescription for [facility] as needed for cough and Cipro to continue for the next 7 days. She is to follow-up with her PCP for recheck of her blood pressure.  ____________________________________________   FINAL CLINICAL IMPRESSION(S) / ED DIAGNOSES  Final diagnoses:  Acute urinary tract infection  Viral illness     ED Discharge Orders        Ordered    ciprofloxacin (CIPRO) 500 MG tablet  2 times daily     11/27/17 1603    benzonatate (TESSALON PERLES) 100 MG capsule  3 times daily PRN     11/27/17 1603       Note:  This document was prepared using Dragon voice recognition software and may include unintentional dictation errors.    Johnn Hai, PA-C 11/27/17 1606    Nance Pear, MD 11/27/17 [phone]

## 2017-12-05 ENCOUNTER — Other Ambulatory Visit: Payer: Self-pay

## 2017-12-05 MED ORDER — HYDROCODONE-ACETAMINOPHEN 5-325 MG PO TABS: 1.0000 | ORAL_TABLET | Freq: Four times a day (QID) | ORAL | 0 refills | Status: DC | PRN

## 2017-12-05 NOTE — Telephone Encounter (Signed)
Pt requesting rx hydrocodone apap. Call when ready for pick up.rx last printed # 60 on 11/07/17. Last seen 08/17/17. Last UDS 01/28/17.

## 2017-12-05 NOTE — Telephone Encounter (Signed)
Rx sent 

## 2017-12-05 NOTE — Telephone Encounter (Signed)
Copied from [place] [phone]. Topic: Quick Communication - Rx Refill/Question >> Dec 05, 2017 10:33 AM [name] wrote: Medication: HYDROcodone-acetaminophen (NORCO/VICODIN) 5-325 MG tablet    Has the patient contacted their pharmacy? No, controlled substance   (Agent: If no, request that the patient contact the pharmacy for the refill.)   Preferred Pharmacy (with phone number or street name): [facility] in [place]: Please be advised that RX refills may take up to 3 business days. We ask that you follow-up with your pharmacy.

## 2017-12-05 NOTE — Telephone Encounter (Signed)
Spoke to pt. Rx at pharmacy

## 2017-12-29 ENCOUNTER — Telehealth: Payer: Self-pay | Admitting: Internal Medicine

## 2017-12-29 NOTE — Telephone Encounter (Signed)
Thank you for trying.

## 2017-12-29 NOTE — Telephone Encounter (Signed)
FYI  Copied from [place]. Topic: General - Other >> Dec 28, 2017  3:59 PM Ahmed Prima L wrote: Patient called and said she needed a ER follow up from 1/6. I advised patient that Dr Silvio Pate did not have anything until next week. I called office and Larene [place] approved for her to be seen for this on 2/13 at 11:15. She said this did not work for her. She did not sch a hospital follow-up.

## 2018-01-06 ENCOUNTER — Telehealth: Payer: Self-pay | Admitting: Internal Medicine

## 2018-01-06 MED ORDER — HYDROCODONE-ACETAMINOPHEN 5-325 MG PO TABS: 1.0000 | ORAL_TABLET | Freq: Four times a day (QID) | ORAL | 0 refills | Status: DC | PRN

## 2018-01-06 NOTE — Telephone Encounter (Signed)
Needs OV before I can fill again

## 2018-01-06 NOTE — Telephone Encounter (Signed)
Copied from [place]. Topic: Quick Communication - Rx Refill/Question >> Jan 06, 2018  1:01 PM Margot Ables wrote: Medication: vicodin, pt has 2 left, takes 4/day Has the patient contacted their pharmacy? Yes.  Was advised to call MD office Preferred Pharmacy (with phone number or street name): [place], [place] - [place] [phone] (Phone) [phone] (Fax)  Agent: Please be advised that RX refills may take up to 3 business days. We ask that you follow-up with your pharmacy.

## 2018-01-06 NOTE — Telephone Encounter (Signed)
Last office visit 08/17/17 Last refill 12/05/17 #60

## 2018-01-09 MED ORDER — HYDROCODONE-ACETAMINOPHEN 5-325 MG PO TABS: 1.0000 | ORAL_TABLET | Freq: Four times a day (QID) | ORAL | 0 refills | Status: DC | PRN

## 2018-01-09 NOTE — Telephone Encounter (Signed)
Pt made appt for 01/16/18, is asking for enough qty to get through until appt - pt is out of meds.  [facility] [place]

## 2018-01-09 NOTE — Telephone Encounter (Signed)
Let her know I sent a week's worth

## 2018-01-10 NOTE — Telephone Encounter (Signed)
Was sent to wrong pharmacy, please send to [facility] in [place]. Please call once sent, [phone]

## 2018-01-11 MED ORDER — HYDROCODONE-ACETAMINOPHEN 5-325 MG PO TABS: 1.0000 | ORAL_TABLET | Freq: Four times a day (QID) | ORAL | 0 refills | Status: DC | PRN

## 2018-01-11 NOTE — Telephone Encounter (Signed)
Called [facility] and left a message reiterating that both rxs were to be cancelled. As my last note stated cancelled rxs at [facility].

## 2018-01-11 NOTE — Telephone Encounter (Signed)
Spoke to pt. Cancelled the rxs at [facility]

## 2018-01-11 NOTE — Telephone Encounter (Signed)
Copied from [place] [phone]. Topic: General - Other >> Jan 11, 2018  3:20 PM Neva Seat wrote: Laveda Abbe - [phone]  Asking if Beatriz Stallion meant to cancel both hydrocodone Rx's - one on 01-06-18 and 01-09-18. Please call Caryl Pina back to discuss.

## 2018-01-11 NOTE — Telephone Encounter (Signed)
Let her know I sent it to the one in [place]. Cancel the other please

## 2018-01-16 ENCOUNTER — Encounter: Payer: Self-pay | Admitting: Internal Medicine

## 2018-01-16 ENCOUNTER — Ambulatory Visit (INDEPENDENT_AMBULATORY_CARE_PROVIDER_SITE_OTHER): Payer: Self-pay | Admitting: Internal Medicine

## 2018-01-16 ENCOUNTER — Other Ambulatory Visit: Payer: Self-pay

## 2018-01-16 VITALS — BP 120/84 | HR 69 | Temp 98.1°F | Wt 141.0 lb

## 2018-01-16 DIAGNOSIS — G8929 Other chronic pain: Secondary | ICD-10-CM

## 2018-01-16 DIAGNOSIS — F3341 Major depressive disorder, recurrent, in partial remission: Secondary | ICD-10-CM

## 2018-01-16 DIAGNOSIS — F17200 Nicotine dependence, unspecified, uncomplicated: Secondary | ICD-10-CM

## 2018-01-16 MED ORDER — HYDROCODONE-ACETAMINOPHEN 5-325 MG PO TABS: 1.0000 | ORAL_TABLET | Freq: Four times a day (QID) | ORAL | 0 refills | Status: DC | PRN

## 2018-01-16 NOTE — Assessment & Plan Note (Signed)
Stopped 2 weeks ago--cold [place] Boyfriend has stopped so she did

## 2018-01-16 NOTE — Assessment & Plan Note (Signed)
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

## 2018-01-16 NOTE — Progress Notes (Signed)
Subjective:    Patient ID: Heather Russo, female    DOB: 16-Mar-1966, 52 y.o.   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 nortriptyline (PAMELOR) 10 MG capsule Take by mouth.    . clonazePAM (KLONOPIN) 0.5 MG tablet Take 1 tablet by mouth 2 (two) times daily as needed.    . fexofenadine (ALLEGRA) 180 MG tablet Take 180 mg by mouth daily as needed.      Marland Kitchen FLUoxetine (PROZAC) 20 MG capsule TAKE 1 CAPSULE(20 MG) BY MOUTH DAILY 90 capsule 0  . gabapentin (NEURONTIN) 100 MG capsule Take 2 capsules by mouth 3 (three) times daily.    Marland Kitchen HYDROcodone-acetaminophen (NORCO/VICODIN) 5-325 MG tablet Take 1 tablet by mouth every 6 (six) hours as needed for moderate pain. 15 tablet 0  . Multiple Vitamin (MULTIVITAMIN) tablet Take 1 tablet by mouth daily.    . pantoprazole (PROTONIX) 40 MG tablet TAKE 1 TABLET(40 MG) BY MOUTH DAILY 90 tablet 1  . traZODone (DESYREL) 50 MG tablet Take 1/2-1 tab nightly    . venlafaxine XR (EFFEXOR-XR) 75 MG 24 hr capsule TAKE 3 CAPSULES BY MOUTH EVERY DAY 90 capsule 1   No current facility-administered medications on file prior to visit.     Allergies  Allergen Reactions  . Bupropion Hcl     REACTION: rash, nausea, bad taste in mouth, blurred vision  . Lisinopril     REACTION: cough,dizziness  . Mirtazapine     REACTION: unspecified  . Mysoline [Primidone] Hives    Likely cause of hives    Past Medical History:  Diagnosis Date  . Allergic rhinitis   . Allergy   . Anxiety   . Cancer ([place])    skin  . Depression   .  Depression   . GERD (gastroesophageal reflux disease)   . HLD (hyperlipidemia)    mild  . HTN (hypertension)   . Migraine   . 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 Syncope and collapse   . Tremor, essential     Past Surgical History:  Procedure Laterality Date  . ESOPHAGOGASTRODUODENOSCOPY  05/05/06  . LAPAROSCOPIC CHOLECYSTECTOMY  03/2008  . wisdom teeth removeal      Family History  Problem Relation Age of Onset  . Hypertension Father   . Heart attack Father   . Diabetes Father   . Hypertension Mother   . Tremor Mother   . Hypertension Brother   . Diabetes Brother   . Hypertension Brother   . Cancer Paternal Grandmother        Lung  . Tremor Brother   . Colon cancer Neg Hx   . Esophageal cancer Neg Hx   . Pancreatic cancer Neg Hx   . Rectal cancer Neg Hx   . Stomach cancer Neg Hx   . Breast cancer Neg Hx     Social History   Socioeconomic History  . Marital status: Widowed    Spouse name: Not on file  . Number of children: 1  .  Years of education: Not on file  . Highest education level: Not on file  Social Needs  . Financial resource strain: Not on file  . Food insecurity - worry: Not on file  . Food insecurity - inability: Not on file  . Transportation needs - medical: Not on file  . Transportation needs - non-medical: Not on file  Occupational History  . Occupation:  Information systems manager-- laid off 2015    Comment:    . Occupation: Engineer, materials  Tobacco Use  . Smoking status: Current Every Day Smoker    Packs/day: 1.00    Types: Cigarettes  . Smokeless tobacco: Never Used  . Tobacco comment: states cut back smoking to1 pack a week.  Substance and Sexual Activity  . Alcohol use: Yes    Alcohol/week: 0.0 oz    Comment: rare  . Drug use: No  . Sexual activity: Not on file  Other Topics Concern  . Not on file  Social History Narrative   Widowed 10/12   Engaged with 2 year relationship--but he died 13-Sep-2023   Review of  Systems Sleeping okay Appetite is better Weight up some---?from working in [employer]? Doesn't tolerate NSAIDs Dr Melrose Nakayama prescribes the clonazepam and zolpidem Did stop smoking 2 weeks ago    Objective:   Physical Exam  Constitutional: She appears well-developed. No distress.  Psychiatric: She has a normal mood and affect. Her behavior is normal.          Assessment & Plan:

## 2018-01-16 NOTE — Assessment & Plan Note (Signed)
Continue current meds 

## 2018-01-20 LAB — PAIN MGMT, PROFILE 8 W/CONF, U
6 Acetylmorphine: NEGATIVE ng/mL (ref <10)
Alcohol Metabolites: NEGATIVE ng/mL (ref <500)
Alphahydroxyalprazolam: NEGATIVE ng/mL (ref <25)
Alphahydroxymidazolam: NEGATIVE ng/mL (ref <50)
Alphahydroxytriazolam: NEGATIVE ng/mL (ref <50)
Aminoclonazepam: 277 ng/mL — ABNORMAL HIGH (ref <25)
Amphetamines: NEGATIVE ng/mL (ref <500)
Benzodiazepines: POSITIVE ng/mL — AB (ref <100)
Buprenorphine, Urine: NEGATIVE ng/mL (ref <5)
Cocaine Metabolite: NEGATIVE ng/mL (ref <150)
Codeine: NEGATIVE ng/mL (ref <50)
Creatinine: 102 mg/dL
Hydrocodone: 852 ng/mL — ABNORMAL HIGH (ref <50)
Hydromorphone: NEGATIVE ng/mL (ref <50)
Hydroxyethylflurazepam: NEGATIVE ng/mL (ref <50)
Lorazepam: NEGATIVE ng/mL (ref <50)
MDMA: NEGATIVE ng/mL (ref <500)
Marijuana Metabolite: 515 ng/mL — ABNORMAL HIGH (ref <5)
Marijuana Metabolite: POSITIVE ng/mL — AB (ref <20)
Morphine: NEGATIVE ng/mL (ref <50)
Nordiazepam: NEGATIVE ng/mL (ref <50)
Norhydrocodone: 1148 ng/mL — ABNORMAL HIGH (ref <50)
Opiates: POSITIVE ng/mL — AB (ref <100)
Oxazepam: NEGATIVE ng/mL (ref <50)
Oxidant: NEGATIVE ug/mL (ref <200)
Oxycodone: NEGATIVE ng/mL (ref <100)
Temazepam: NEGATIVE ng/mL (ref <50)
pH: 6.73 (ref 4.5–9.0)

## 2018-01-25 ENCOUNTER — Telehealth: Payer: Self-pay | Admitting: Internal Medicine

## 2018-01-25 NOTE — Telephone Encounter (Signed)
Copied from [place] [phone]. Topic: Quick Communication - Rx Refill/Question >> Jan 25, 2018  2:20 PM Aurelio Brash B wrote: Medication: venlafaxine XR (EFFEXOR-XR) 75 MG 24 hr capsule    Has the patient contacted their pharmacy? Yes, she changed pharmacies    (Agent: If no, request that the patient contact the pharmacy for the refill.)   Preferred Pharmacy (with phone number or street name): [place], [place] - [place] [phone] (Phone) [phone] (Fax)     Agent: Please be advised that RX refills may take up to 3 business days. We ask that you follow-up with your pharmacy.

## 2018-01-26 MED ORDER — VENLAFAXINE HCL ER 75 MG PO CP24: 225.0000 mg | ORAL_CAPSULE | Freq: Every day | ORAL | 1 refills | Status: DC

## 2018-02-09 ENCOUNTER — Other Ambulatory Visit: Payer: Self-pay | Admitting: Internal Medicine

## 2018-02-09 MED ORDER — HYDROCODONE-ACETAMINOPHEN 5-325 MG PO TABS: 1.0000 | ORAL_TABLET | Freq: Four times a day (QID) | ORAL | 0 refills | Status: DC | PRN

## 2018-02-09 NOTE — Telephone Encounter (Signed)
Copied from [place]. Topic: Quick Communication - Rx Refill/Question >> Feb 09, 2018 11:38 AM Margot Ables wrote: Medication: hydrocodone - 4 days left - pt said she didn't know she had to call  Has the patient contacted their pharmacy? No - controlled Preferred Pharmacy (with phone number or street name): [place] [address] [place] - [place] [phone] (Phone) [phone] (Fax)

## 2018-02-09 NOTE — Telephone Encounter (Signed)
Last Ov and Rx 02/25 #75.

## 2018-03-14 ENCOUNTER — Other Ambulatory Visit: Payer: Self-pay | Admitting: Internal Medicine

## 2018-03-21 ENCOUNTER — Other Ambulatory Visit: Payer: Self-pay | Admitting: Internal Medicine

## 2018-03-21 NOTE — Telephone Encounter (Signed)
Requesting refill hydrocodone apap to [facility] [place] Last refilled # 75 on 02/09/18 Last seen 01/16/18 UDS; 01/16/18.

## 2018-03-21 NOTE — Telephone Encounter (Signed)
Rx refill request: hydrocodone acetaminophen 5-325 mg    Filled: 02/09/18  # 75   LOV: 01/16/18  PCP: [place]: verified

## 2018-03-21 NOTE — Telephone Encounter (Signed)
Copied from [place] [id number]. Topic: Quick Communication - Rx Refill/Question >> Mar 21, 2018 11:05 AM Robina Ade, Helene Kelp D wrote: Medication:HYDROcodone-acetaminophen (NORCO/VICODIN) 5-325 MG tablet Has the patient contacted their pharmacy? Yes (Agent: If no, request that the patient contact the pharmacy for the refill.) Preferred Pharmacy (with phone number or street name): [place], [place] Agent: Please be advised that RX refills may take up to 3 business days. We ask that you follow-up with your pharmacy.

## 2018-03-22 MED ORDER — HYDROCODONE-ACETAMINOPHEN 5-325 MG PO TABS: 1.0000 | ORAL_TABLET | Freq: Four times a day (QID) | ORAL | 0 refills | Status: DC | PRN

## 2018-04-24 ENCOUNTER — Encounter: Payer: Self-pay | Admitting: Internal Medicine

## 2018-04-24 ENCOUNTER — Ambulatory Visit: Payer: Self-pay | Admitting: Internal Medicine

## 2018-04-24 VITALS — BP 112/80 | HR 69 | Temp 98.0°F | Ht 62.0 in | Wt 135.0 lb

## 2018-04-24 DIAGNOSIS — G5711 Meralgia paresthetica, right lower limb: Secondary | ICD-10-CM | POA: Insufficient documentation

## 2018-04-24 DIAGNOSIS — G8929 Other chronic pain: Secondary | ICD-10-CM

## 2018-04-24 MED ORDER — TRAZODONE HCL 50 MG PO TABS: ORAL_TABLET | ORAL | 11 refills | Status: DC

## 2018-04-24 MED ORDER — HYDROCODONE-ACETAMINOPHEN 5-325 MG PO TABS: 1.0000 | ORAL_TABLET | Freq: Four times a day (QID) | ORAL | 0 refills | Status: DC | PRN

## 2018-04-24 NOTE — Assessment & Plan Note (Signed)
CSRS reviewed---nothing of concern Does okay with with the hydrocodone

## 2018-04-24 NOTE — Progress Notes (Signed)
Subjective:    Patient ID: Heather Russo, female    DOB: November 04, 1966, 52 y.o.   MRN: [id number]  HPI Here for follow up of chronic narcotic dependence  Only new issue is "warm sensation" down lateral right upper thigh Did have some hip pain but that is better No known injury  Ongoing back pain Uses the narcotic regularly  Still on clonazepam from neurologist  Current Outpatient Medications on File Prior to Visit  Medication Sig Dispense Refill  . fexofenadine (ALLEGRA) 180 MG tablet Take 180 mg by mouth daily as needed.      Marland Kitchen FLUoxetine (PROZAC) 20 MG capsule TAKE 1 CAPSULE(20 MG) BY MOUTH DAILY 90 capsule 0  . gabapentin (NEURONTIN) 100 MG capsule Take 2 capsules by mouth 3 (three) times daily.    Marland Kitchen HYDROcodone-acetaminophen (NORCO/VICODIN) 5-325 MG tablet Take 1 tablet by mouth every 6 (six) hours as needed for moderate pain. 75 tablet 0  . Multiple Vitamin (MULTIVITAMIN) tablet Take 1 tablet by mouth daily.    . nortriptyline (PAMELOR) 10 MG capsule Take by mouth.    . pantoprazole (PROTONIX) 40 MG tablet TAKE 1 TABLET(40 MG) BY MOUTH DAILY 90 tablet 1  . traZODone (DESYREL) 50 MG tablet Take 1/2-1 tab nightly    . venlafaxine XR (EFFEXOR-XR) 75 MG 24 hr capsule TAKE 3 CAPSULES BY MOUTH ONCE DAILY 90 capsule 2  . clonazePAM (KLONOPIN) 0.5 MG tablet Take 1 tablet by mouth 2 (two) times daily as needed.     No current facility-administered medications on file prior to visit.     Allergies  Allergen Reactions  . Bupropion Hcl     REACTION: rash, nausea, bad taste in mouth, blurred vision  . Lisinopril     REACTION: cough,dizziness  . Mirtazapine     REACTION: unspecified  . Mysoline [Primidone] Hives    Likely cause of hives    Past Medical History:  Diagnosis Date  . Allergic rhinitis   . Allergy   . Anxiety   . Cancer ([place])    skin  . Depression   . Depression   . GERD (gastroesophageal reflux disease)   . HLD (hyperlipidemia)    mild  . HTN (hypertension)    . Migraine   . 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 Syncope and collapse   . Tremor, essential     Past Surgical History:  Procedure Laterality Date  . ESOPHAGOGASTRODUODENOSCOPY  05/05/06  . LAPAROSCOPIC CHOLECYSTECTOMY  03/2008  . wisdom teeth removeal      Family History  Problem Relation Age of Onset  . Hypertension Father   . Heart attack Father   . Diabetes Father   . Hypertension Mother   . Tremor Mother   . Hypertension Brother   . Diabetes Brother   . Hypertension Brother   . Cancer Paternal Grandmother        Lung  . Tremor Brother   . Colon cancer Neg Hx   . Esophageal cancer Neg Hx   . Pancreatic cancer Neg Hx   . Rectal cancer Neg Hx   . Stomach cancer Neg Hx   . Breast cancer Neg Hx     Social History   Socioeconomic History  . Marital status: Widowed    Spouse name: Not on file  . Number of children: 1  . Years of education: Not on file  . Highest education level: Not on file  Occupational History  .  Occupation:  Information systems manager-- laid off 2015    Comment:    . Occupation: Engineer, materials  Social Needs  . Financial resource strain: Not on file  . Food insecurity:    Worry: Not on file    Inability: Not on file  . Transportation needs:    Medical: Not on file    Non-medical: Not on file  Tobacco Use  . Smoking status: Former Smoker    Packs/day: 1.00    Types: Cigarettes    Last attempt to quit: 01/02/2018    Years since quitting: 0.3  . Smokeless tobacco: Never Used  . Tobacco comment: states cut back smoking to1 pack a week.  Substance and Sexual Activity  . Alcohol use: Yes    Alcohol/week: 0.0 oz    Comment: rare  . Drug use: No  . Sexual activity: Not on file  Lifestyle  . Physical activity:    Days per week: Not on file    Minutes per session: Not on file  . Stress: Not on file  Relationships  . Social connections:    Talks on phone: Not on file    Gets together: Not on file    Attends religious  service: Not on file    Active member of club or organization: Not on file    Attends meetings of clubs or organizations: Not on file    Relationship status: Not on file  . Intimate partner violence:    Fear of current or ex partner: Not on file    Emotionally abused: Not on file    Physically abused: Not on file    Forced sexual activity: Not on file  Other Topics Concern  . Not on file  Social History Narrative   Widowed 10/12   Engaged with 2 year relationship--but he died September 14, 2023   Review of Systems Sleeps okay---uses the clonazepam (tid) Appetite is fine Weight stable Depression is some better    Objective:   Physical Exam  Constitutional: She appears well-developed. No distress.  Musculoskeletal:  SLR negative Normal ROM in hip  Neurological:  Gait is normal Normal sensation in lateral thigh  Psychiatric: She has a normal mood and affect. Her behavior is normal.           Assessment & Plan:

## 2018-04-24 NOTE — Assessment & Plan Note (Signed)
Discussed that symptoms appear to be sensory only She will let me know if any weakness, extension, etc (that would not go along with this diagnosis)

## 2018-05-30 ENCOUNTER — Other Ambulatory Visit: Payer: Self-pay | Admitting: Internal Medicine

## 2018-05-30 NOTE — Telephone Encounter (Signed)
Hydrocodone refill Last OV:04/24/18 Last refill:04/24/18 75 tab/0 refill JTT:[id number] Pharmacy: [facility] [place], [place], [address] AT [place] [phone] (Phone) [phone] (Fax)

## 2018-05-30 NOTE — Telephone Encounter (Signed)
Name of Medication: Hydrocodone apap 5-325 Name of Pharmacy: [facility] [place] or Written Date and Quantity: #75 on 04/24/18 Last Office Visit and Type: 04/24/18 pain mgt Next Office Visit and Type: 08/03/18 3 mth f/u Last Controlled Substance Agreement Date: 01/16/18 Last UDS:01/16/18

## 2018-05-30 NOTE — Telephone Encounter (Signed)
Copied from [place] [id number]. Topic: Quick Communication - Rx Refill/Question >> May 30, 2018  9:37 AM Alfredia Ferguson R wrote: Medication: HYDROcodone-acetaminophen (NORCO/VICODIN) 5-325 MG tablet  Has the patient contacted their pharmacy? Yes (Agent: If no, request that the patient contact the pharmacy for the refill.) (Agent: If yes, when and what did the pharmacy advise?)  Preferred Pharmacy (with phone number or street name): [facility] [place], [place] [address] AT [place] [phone] (Phone) [phone] (Fax)      Agent: Please be advised that RX refills may take up to 3 business days. We ask that you follow-up with your pharmacy.

## 2018-05-31 MED ORDER — HYDROCODONE-ACETAMINOPHEN 5-325 MG PO TABS: 1.0000 | ORAL_TABLET | Freq: Four times a day (QID) | ORAL | 0 refills | Status: DC | PRN

## 2018-06-19 ENCOUNTER — Other Ambulatory Visit: Payer: Self-pay | Admitting: Internal Medicine

## 2018-07-03 ENCOUNTER — Other Ambulatory Visit: Payer: Self-pay | Admitting: Internal Medicine

## 2018-07-03 NOTE — Telephone Encounter (Signed)
Copied from [place] [id number]. Topic: Quick Communication - Rx Refill/Question >> Jul 03, 2018  5:01 PM Marin Olp L wrote: Medication: HYDROcodone-acetaminophen (NORCO/VICODIN) 5-325 MG tablet  Has the patient contacted their pharmacy? Yes.   (Agent: If no, request that the patient contact the pharmacy for the refill.) (Agent: If yes, when and what did the pharmacy advise?)  Preferred Pharmacy (with phone number or street name): [place], [place] - [address] [address] Phone: [phone] Fax: [phone]  Agent: Please be advised that RX refills may take up to 3 business days. We ask that you follow-up with your pharmacy.

## 2018-07-04 MED ORDER — HYDROCODONE-ACETAMINOPHEN 5-325 MG PO TABS: 1.0000 | ORAL_TABLET | Freq: Four times a day (QID) | ORAL | 0 refills | Status: DC | PRN

## 2018-07-04 NOTE — Telephone Encounter (Signed)
Name of Medication: Hydrocodone apap 5-325 Name of Pharmacy: [facility] or Written Date and Quantity: #75 on 05/31/18 Last Office Visit and Type: 04/24/18 pain mgt Next Office Visit and Type:08/03/18  3 mth FU Last Controlled Substance Agreement Date: 01/16/18 Last UDS:01/16/18

## 2018-08-03 ENCOUNTER — Encounter: Payer: Self-pay | Admitting: Internal Medicine

## 2018-08-03 ENCOUNTER — Ambulatory Visit: Payer: Self-pay | Admitting: Internal Medicine

## 2018-08-03 VITALS — BP 112/70 | HR 81 | Temp 98.5°F | Wt 141.8 lb

## 2018-08-03 DIAGNOSIS — M7711 Lateral epicondylitis, right elbow: Secondary | ICD-10-CM

## 2018-08-03 DIAGNOSIS — M7061 Trochanteric bursitis, right hip: Secondary | ICD-10-CM

## 2018-08-03 DIAGNOSIS — M7710 Lateral epicondylitis, unspecified elbow: Secondary | ICD-10-CM | POA: Insufficient documentation

## 2018-08-03 DIAGNOSIS — F1120 Opioid dependence, uncomplicated: Secondary | ICD-10-CM

## 2018-08-03 NOTE — Assessment & Plan Note (Signed)
Also some medial pain Discussed trying strap

## 2018-08-03 NOTE — Assessment & Plan Note (Signed)
CSRS reviewed Gets clonazepam still from neurologist No concerns

## 2018-08-03 NOTE — Assessment & Plan Note (Signed)
Discussed ice Stay active

## 2018-08-03 NOTE — Progress Notes (Signed)
Subjective:    Patient ID: Heather Russo, female    DOB: 11/06/1966, 52 y.o.   MRN: [id number]  HPI Here for review of chronic pain and narcotic dependence  Having problem with her right elbow lately Also right hip Hands are stiff and painful in the morning---they do loosen up after a while  No injuries Hydrocodone does help for all that Ongoing back pain--- same dosing on hydrocodone  Current Outpatient Medications on File Prior to Visit  Medication Sig Dispense Refill  . fexofenadine (ALLEGRA) 180 MG tablet Take 180 mg by mouth daily as needed.      Marland Kitchen FLUoxetine (PROZAC) 20 MG capsule TAKE 1 CAPSULE(20 MG) BY MOUTH DAILY 90 capsule 0  . gabapentin (NEURONTIN) 100 MG capsule Take 2 capsules by mouth 3 (three) times daily.    Marland Kitchen HYDROcodone-acetaminophen (NORCO/VICODIN) 5-325 MG tablet Take 1 tablet by mouth every 6 (six) hours as needed for moderate pain. 75 tablet 0  . Multiple Vitamin (MULTIVITAMIN) tablet Take 1 tablet by mouth daily.    . nortriptyline (PAMELOR) 10 MG capsule Take by mouth.    . pantoprazole (PROTONIX) 40 MG tablet TAKE 1 TABLET(40 MG) BY MOUTH DAILY 90 tablet 1  . traZODone (DESYREL) 50 MG tablet Take 1/2-1 tab nightly 30 tablet 11  . venlafaxine XR (EFFEXOR-XR) 75 MG 24 hr capsule TAKE 3 CAPSULES BY MOUTH ONCE DAILY 90 capsule 1  . clonazePAM (KLONOPIN) 0.5 MG tablet Take 1 tablet by mouth 2 (two) times daily as needed.     No current facility-administered medications on file prior to visit.     Allergies  Allergen Reactions  . Bupropion Hcl     REACTION: rash, nausea, bad taste in mouth, blurred vision  . Lisinopril     REACTION: cough,dizziness  . Mirtazapine     REACTION: unspecified  . Mysoline [Primidone] Hives    Likely cause of hives    Past Medical History:  Diagnosis Date  . Allergic rhinitis   . Allergy   . Anxiety   . Cancer ([place])    skin  . Depression   . Depression   . GERD (gastroesophageal reflux disease)   . HLD  (hyperlipidemia)    mild  . HTN (hypertension)   . Migraine   . 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 Syncope and collapse   . Tremor, essential     Past Surgical History:  Procedure Laterality Date  . ESOPHAGOGASTRODUODENOSCOPY  05/05/06  . LAPAROSCOPIC CHOLECYSTECTOMY  03/2008  . wisdom teeth removeal      Family History  Problem Relation Age of Onset  . Hypertension Father   . Heart attack Father   . Diabetes Father   . Hypertension Mother   . Tremor Mother   . Hypertension Brother   . Diabetes Brother   . Hypertension Brother   . Cancer Paternal Grandmother        Lung  . Tremor Brother   . Colon cancer Neg Hx   . Esophageal cancer Neg Hx   . Pancreatic cancer Neg Hx   . Rectal cancer Neg Hx   . Stomach cancer Neg Hx   . Breast cancer Neg Hx     Social History   Socioeconomic History  . Marital status: Widowed    Spouse name: Not on file  . Number of children: 1  . Years of education: Not on file  . Highest education level: Not on  file  Occupational History  . Occupation:  Information systems manager-- laid off 2015    Comment:    . Occupation: Engineer, materials  Social Needs  . Financial resource strain: Not on file  . Food insecurity:    Worry: Not on file    Inability: Not on file  . Transportation needs:    Medical: Not on file    Non-medical: Not on file  Tobacco Use  . Smoking status: Former Smoker    Packs/day: 1.00    Types: Cigarettes    Last attempt to quit: 01/02/2018    Years since quitting: 0.5  . Smokeless tobacco: Never Used  . Tobacco comment: states cut back smoking to1 pack a week.  Substance and Sexual Activity  . Alcohol use: Yes    Alcohol/week: 0.0 standard drinks    Comment: rare  . Drug use: No  . Sexual activity: Not on file  Lifestyle  . Physical activity:    Days per week: Not on file    Minutes per session: Not on file  . Stress: Not on file  Relationships  . Social connections:    Talks on phone:  Not on file    Gets together: Not on file    Attends religious service: Not on file    Active member of club or organization: Not on file    Attends meetings of clubs or organizations: Not on file    Relationship status: Not on file  . Intimate partner violence:    Fear of current or ex partner: Not on file    Emotionally abused: Not on file    Physically abused: Not on file    Forced sexual activity: Not on file  Other Topics Concern  . Not on file  Social History Narrative   Widowed 10/12   Engaged with 2 year relationship--but he died 2023-09-14   Review of Systems  Appetite is okay Sleeping fine     Objective:   Physical Exam  Constitutional: She appears well-developed. No distress.  Musculoskeletal:  No hand synovitis Normal ROM of right elbow--tenderness along tendons Tender over bursa--right hip  Neurological:  Normal gait No weakness           Assessment & Plan:

## 2018-08-08 ENCOUNTER — Other Ambulatory Visit: Payer: Self-pay | Admitting: Internal Medicine

## 2018-08-08 NOTE — Telephone Encounter (Signed)
Copied from [place] [id number]. Topic: Quick Communication - Rx Refill/Question >> Aug 08, 2018  4:45 PM Oliver Pila B wrote: Medication: HYDROcodone-acetaminophen (NORCO/VICODIN) 5-325 MG tablet [[id number]]   Has the patient contacted their pharmacy? Yes.   (Agent: If no, request that the patient contact the pharmacy for the refill.) (Agent: If yes, when and what did the pharmacy advise?)  [facility]  Agent: Please be advised that RX refills may take up to 3 business days. We ask that you follow-up with your pharmacy.

## 2018-08-09 MED ORDER — HYDROCODONE-ACETAMINOPHEN 5-325 MG PO TABS: 1.0000 | ORAL_TABLET | Freq: Four times a day (QID) | ORAL | 0 refills | Status: DC | PRN

## 2018-08-09 NOTE — Telephone Encounter (Signed)
Name of Medication:  Hydrocodone Name of Pharmacy: [facility] [place] Last Fill or Written Date and Quantity: 07-04-18 #75 Last Office Visit and Type: 3 Month F/U 08-03-18 Next Office Visit and Type: 3 Month F/U 10-30-18 Last Controlled Substance Agreement Date: 01-16-18 Last UDS: 01-16-18

## 2018-08-10 ENCOUNTER — Ambulatory Visit: Payer: Self-pay | Admitting: Internal Medicine

## 2018-08-14 ENCOUNTER — Ambulatory Visit: Payer: Self-pay | Admitting: Family Medicine

## 2018-08-15 ENCOUNTER — Ambulatory Visit (INDEPENDENT_AMBULATORY_CARE_PROVIDER_SITE_OTHER)
Admission: RE | Admit: 2018-08-15 | Discharge: 2018-08-15 | Disposition: A | Discharge status: Home / Self Care | Payer: Self-pay | Source: Ambulatory Visit | Attending: Internal Medicine | Admitting: Internal Medicine

## 2018-08-15 ENCOUNTER — Encounter: Payer: Self-pay | Admitting: Internal Medicine

## 2018-08-15 ENCOUNTER — Ambulatory Visit (INDEPENDENT_AMBULATORY_CARE_PROVIDER_SITE_OTHER): Payer: Self-pay | Admitting: Internal Medicine

## 2018-08-15 VITALS — BP 122/86 | HR 104 | Temp 98.0°F | Wt 138.0 lb

## 2018-08-15 DIAGNOSIS — M79672 Pain in left foot: Secondary | ICD-10-CM

## 2018-08-15 DIAGNOSIS — M7989 Other specified soft tissue disorders: Secondary | ICD-10-CM

## 2018-08-15 DIAGNOSIS — S9032XA Contusion of left foot, initial encounter: Secondary | ICD-10-CM

## 2018-08-15 DIAGNOSIS — W108XXA Fall (on) (from) other stairs and steps, initial encounter: Secondary | ICD-10-CM

## 2018-08-15 NOTE — Patient Instructions (Signed)
RICE for Routine Care of Injuries Many injuries can be cared for using rest, ice, compression, and elevation (RICE therapy). Using RICE therapy can help to lessen pain and swelling. It can help your body to heal. Rest Reduce your normal activities and avoid using the injured part of your body. You can go back to your normal activities when you feel okay and your doctor says it is okay. Ice Do not put ice on your bare skin.  Put ice in a plastic bag.  Place a towel between your skin and the bag.  Leave the ice on for 20 minutes, 2-3 times a day.  Do this for as long as told by your doctor. Compression Compression means putting pressure on the injured area. This can be done with an elastic bandage. If an elastic bandage has been applied:  Remove and reapply the bandage every 3-4 hours or as told by your doctor.  Make sure the bandage is not wrapped too tight. Wrap the bandage more loosely if part of your body beyond the bandage is blue, swollen, cold, painful, or loses feeling (numb).  See your doctor if the bandage seems to make your problems worse.  Elevation Elevation means keeping the injured area raised. Raise the injured area above your heart or the center of your chest if you can. When should I get help? You should get help if:  You keep having pain and swelling.  Your symptoms get worse.  Get help right away if: You should get help right away if:  You have sudden bad pain at or below the area of your injury.  You have redness or more swelling around your injury.  You have tingling or numbness at or below the injury that does not go away when you take off the bandage.  This information is not intended to replace advice given to you by your health care provider. Make sure you discuss any questions you have with your health care provider. Document Released: 04/26/2008 Document Revised: 10/05/2016 Document Reviewed: 10/16/2014 Elsevier Interactive Patient Education  2017  Elsevier Inc.  

## 2018-08-15 NOTE — Progress Notes (Signed)
Subjective:    Patient ID: Heather Russo, female    DOB: 1966/05/22, 52 y.o.   MRN: [id number]  HPI  Pt presents to the clinic today with c/o left foot pain. She reports she was coming down concrete steps and missed the last step. Her ankle rolled. She reports pain, swelling and difficulty with weight bearing since that time. She has tried rest, ice and elevation with minimal relief.  Review of Systems  Past Medical History:  Diagnosis Date  . Allergic rhinitis   . Allergy   . Anxiety   . Cancer ([place])    skin  . Depression   . Depression   . GERD (gastroesophageal reflux disease)   . HLD (hyperlipidemia)    mild  . HTN (hypertension)   . Migraine   . 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 Syncope and collapse   . Tremor, essential     Current Outpatient Medications  Medication Sig Dispense Refill  . fexofenadine (ALLEGRA) 180 MG tablet Take 180 mg by mouth daily as needed.      Marland Kitchen FLUoxetine (PROZAC) 20 MG capsule TAKE 1 CAPSULE(20 MG) BY MOUTH DAILY 90 capsule 0  . gabapentin (NEURONTIN) 100 MG capsule Take 2 capsules by mouth 3 (three) times daily.    Marland Kitchen HYDROcodone-acetaminophen (NORCO/VICODIN) 5-325 MG tablet Take 1 tablet by mouth every 6 (six) hours as needed for moderate pain. 75 tablet 0  . Multiple Vitamin (MULTIVITAMIN) tablet Take 1 tablet by mouth daily.    . nortriptyline (PAMELOR) 10 MG capsule Take by mouth.    . pantoprazole (PROTONIX) 40 MG tablet TAKE 1 TABLET(40 MG) BY MOUTH DAILY 90 tablet 1  . traZODone (DESYREL) 50 MG tablet Take 1/2-1 tab nightly 30 tablet 11  . venlafaxine XR (EFFEXOR-XR) 75 MG 24 hr capsule TAKE 3 CAPSULES BY MOUTH ONCE DAILY 90 capsule 1  . clonazePAM (KLONOPIN) 0.5 MG tablet Take 1 tablet by mouth 2 (two) times daily as needed.     No current facility-administered medications for this visit.     Allergies  Allergen Reactions  . Bupropion Hcl     REACTION: rash, nausea, bad taste in mouth, blurred vision   . Lisinopril     REACTION: cough,dizziness  . Mirtazapine     REACTION: unspecified  . Mysoline [Primidone] Hives    Likely cause of hives    Family History  Problem Relation Age of Onset  . Hypertension Father   . Heart attack Father   . Diabetes Father   . Hypertension Mother   . Tremor Mother   . Hypertension Brother   . Diabetes Brother   . Hypertension Brother   . Cancer Paternal Grandmother        Lung  . Tremor Brother   . Colon cancer Neg Hx   . Esophageal cancer Neg Hx   . Pancreatic cancer Neg Hx   . Rectal cancer Neg Hx   . Stomach cancer Neg Hx   . Breast cancer Neg Hx     Social History   Socioeconomic History  . Marital status: Widowed    Spouse name: Not on file  . Number of children: 1  . Years of education: Not on file  . Highest education level: Not on file  Occupational History  . Occupation:  Information systems manager-- laid off 2015    Comment:    . Occupation: Engineer, materials  Social Needs  . Emergency planning/management officer  strain: Not on file  . Food insecurity:    Worry: Not on file    Inability: Not on file  . Transportation needs:    Medical: Not on file    Non-medical: Not on file  Tobacco Use  . Smoking status: Former Smoker    Packs/day: 1.00    Types: Cigarettes    Last attempt to quit: 01/02/2018    Years since quitting: 0.6  . Smokeless tobacco: Never Used  . Tobacco comment:    Substance and Sexual Activity  . Alcohol use: Yes    Alcohol/week: 0.0 standard drinks    Comment: rare  . Drug use: No  . Sexual activity: Not on file  Lifestyle  . Physical activity:    Days per week: Not on file    Minutes per session: Not on file  . Stress: Not on file  Relationships  . Social connections:    Talks on phone: Not on file    Gets together: Not on file    Attends religious service: Not on file    Active member of club or organization: Not on file    Attends meetings of clubs or organizations: Not on file    Relationship status: Not on  file  . Intimate partner violence:    Fear of current or ex partner: Not on file    Emotionally abused: Not on file    Physically abused: Not on file    Forced sexual activity: Not on file  Other Topics Concern  . Not on file  Social History Narrative   Widowed 10/12   Engaged with 2 year relationship--but he died 2023/09/13     Constitutional: Denies fever, malaise, fatigue, headache or abrupt weight changes.  Musculoskeletal: Pt reports left foot pain, swelling and difficulty with gait. Denies decrease in range of motion, muscle pain.  Skin: Denies redness, rashes, lesions or ulcercations.    No other specific complaints in a complete review of systems (except as listed in HPI above).     Objective:   Physical Exam  BP 122/86   Pulse (!) 104   Temp 98 F (36.7 C) (Oral)   Wt 138 lb (62.6 kg)   LMP 07/24/2015   BMI 25.24 kg/m  Wt Readings from Last 3 Encounters:  08/15/18 138 lb (62.6 kg)  08/03/18 141 lb 12 oz (64.3 kg)  04/24/18 135 lb (61.2 kg)    General: Appears her stated age, well developed, well nourished in NAD. Skin: Warm, dry and intact. Bruising noted over 2nd, 3rd, 4th and 5th metatarsals. Cardiovascular:  Pedal pulse 1+ bilaterally Musculoskeletal: Decreased flexion, extension and rotation of the left ankle due to pain in the forefoot. Able to wiggle toes. No pain with palpation of the Achilles tendon or bilateral malleoli. Pain with palpation over the 3rd, 4th and 5th metatarsals. Difficulty with gait, with crutch. Neurological: Alert and oriented.   BMET    Component Value Date/Time   NA 138 11/27/2017 1304   K 3.5 11/27/2017 1304   CL 105 11/27/2017 1304   CO2 21 (L) 11/27/2017 1304   GLUCOSE 119 (H) 11/27/2017 1304   BUN 15 11/27/2017 1304   CREATININE 0.83 11/27/2017 1304   CALCIUM 9.5 11/27/2017 1304   GFRNONAA >60 11/27/2017 1304   GFRAA >60 11/27/2017 1304    Lipid Panel     Component Value Date/Time   CHOL 144 05/27/2015 0854   TRIG  93.0 05/27/2015 0854   HDL 33.20 (L) 05/27/2015 2202  CHOLHDL 4 05/27/2015 0854   VLDL 18.6 05/27/2015 0854   LDLCALC 92 05/27/2015 0854    CBC    Component Value Date/Time   WBC 11.6 (H) 11/27/2017 1304   RBC 5.58 (H) 11/27/2017 1304   HGB 16.4 (H) 11/27/2017 1304   HCT 49.1 (H) 11/27/2017 1304   PLT 367 11/27/2017 1304   MCV 88.0 11/27/2017 1304   MCH 29.3 11/27/2017 1304   MCHC 33.3 11/27/2017 1304   RDW 13.6 11/27/2017 1304   LYMPHSABS 3.2 11/27/2017 1304   MONOABS 0.6 11/27/2017 1304   EOSABS 0.1 11/27/2017 1304   BASOSABS 0.1 11/27/2017 1304    Hgb A1C No results found for: HGBA1C          Assessment & Plan:   Left Foot Pain, Swelling, Bruising s/p Fall:  Xray left foot today, no obvious fracture but will await radiology confirmation Left foot wrapped in ACE wrap Crutches given Encouraged RICE Has Hydrocodone to take, advised her to take this prn  Will follow up after xray, return precautions discussed Webb Silversmith, NP

## 2018-08-16 ENCOUNTER — Telehealth: Payer: Self-pay | Admitting: Internal Medicine

## 2018-08-16 ENCOUNTER — Other Ambulatory Visit: Payer: Self-pay | Admitting: Internal Medicine

## 2018-08-16 DIAGNOSIS — S92245A Nondisplaced fracture of medial cuneiform of left foot, initial encounter for closed fracture: Secondary | ICD-10-CM

## 2018-08-16 NOTE — Telephone Encounter (Signed)
Copied from [place] [id number]. Topic: Quick Communication - See Telephone Encounter >> Aug 16, 2018 11:47 AM Gardiner Ramus wrote: CRM for notification. See Telephone encounter for: 08/16/18. Pt calling for xray results from 08/15/18. Please advise Cb#[phone]

## 2018-08-16 NOTE — Telephone Encounter (Signed)
Spoke with patient and advised of Xray result. Patient does not have insurance and wonders if she can just put a boot on or something that would not be as expensive for this? Not sure if she can afford going to a specialist. Patient asks for someone to call her back today please with the advise. Thank Edrick Kins, RMA

## 2018-08-16 NOTE — Telephone Encounter (Signed)
If she does not want to see a specialist, I would like her to see Dr. Lorelei Pont in this office.

## 2018-08-16 NOTE — Telephone Encounter (Signed)
Copied from [place] [id number]. Topic: Quick Communication - See Telephone Encounter >> Aug 16, 2018 11:47 AM Gardiner Ramus wrote: CRM for notification. See Telephone encounter for: 08/16/18. Pt calling for xray results from 08/15/18. Please advise Cb#[phone]

## 2018-08-16 NOTE — Progress Notes (Signed)
amb  

## 2018-08-16 NOTE — Telephone Encounter (Signed)
Pt has an appt scheduled with Dr Lorelei Pont tomorrow

## 2018-08-16 NOTE — Telephone Encounter (Signed)
The results are in but Rollene Fare does not come in until this afternoon. I will call when I receive the results

## 2018-08-17 ENCOUNTER — Encounter: Payer: Self-pay | Admitting: Family Medicine

## 2018-08-17 ENCOUNTER — Ambulatory Visit: Payer: Self-pay | Admitting: Family Medicine

## 2018-08-17 VITALS — BP 118/84 | HR 79 | Temp 98.3°F | Ht 62.0 in | Wt 142.2 lb

## 2018-08-17 DIAGNOSIS — S92245A Nondisplaced fracture of medial cuneiform of left foot, initial encounter for closed fracture: Secondary | ICD-10-CM

## 2018-08-17 DIAGNOSIS — M79672 Pain in left foot: Secondary | ICD-10-CM

## 2018-08-17 NOTE — Patient Instructions (Signed)
Get your CAM walker boot.   Use your walker like we talked about. Partial non-weightbearing.   I will see you in 3 weeks.

## 2018-08-17 NOTE — Progress Notes (Signed)
Dr. Frederico Hamman T. Copland, MD, [facility] [place], [address] Phone: [phone] Fax: [phone]  08/17/2018  Patient: Heather Russo, MRN: [id number], DOB: 08-23-1966, 52 y.o.  Primary Physician:  [name], MD   Chief Complaint  Patient presents with  . Follow-up    Left Foot Fx   Subjective:   Heather Russo is a 52 y.o. very pleasant female patient who presents with the following:  DOI 08/13/2018  The patient was referred to me for management for a medial cuneiform fracture, as well as midfoot injury.  She stepped off of a very awkward angle with extreme dorsiflexion as well as inversion on Sunday.  At that time, she was unable to ambulate and bear weight.  Subsequently she came to our office yesterday after she had persistent pain and swelling.  She comes in today bearing weight on both feet, and she is using one crutch.  She does have 2 crutches at home.  Medial cuneiform fracture.    Walker Sunday late after   Past Medical History, Surgical History, Social History, Family History, Problem List, Medications, and Allergies have been reviewed and updated if relevant.  Patient Active Problem List   Diagnosis Date Noted  . Trochanteric bursitis of right hip 08/03/2018  . Epicondylitis, lateral (tennis elbow) 08/03/2018  . Narcotic dependence ([place]) 08/03/2018  . Meralgia paresthetica of right side 04/24/2018  . Encounter for chronic pain management 05/17/2017  . Smoker 05/02/2017  . Special screening for malignant neoplasms, colon 05/28/2016  . Esophageal dysphagia 05/18/2016  . MDD (major depressive disorder), recurrent, in partial remission ([place]) 12/24/2015  . Chronic back pain 12/24/2015  . Routine general medical examination at a health care facility 04/03/2014  . Essential tremor 01/22/2011  . COMMON MIGRAINE 04/03/2007  . Essential hypertension, benign 04/03/2007  . ALLERGIC RHINITIS 04/03/2007    . GERD 04/03/2007    Past Medical History:  Diagnosis Date  . Allergic rhinitis   . Allergy   . Anxiety   . Cancer ([place])    skin  . Depression   . Depression   . GERD (gastroesophageal reflux disease)   . HLD (hyperlipidemia)    mild  . HTN (hypertension)   . Migraine   . 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 Syncope and collapse   . Tremor, essential     Past Surgical History:  Procedure Laterality Date  . ESOPHAGOGASTRODUODENOSCOPY  05/05/06  . LAPAROSCOPIC CHOLECYSTECTOMY  03/2008  . wisdom teeth removeal      Social History   Socioeconomic History  . Marital status: Widowed    Spouse name: Not on file  . Number of children: 1  . Years of education: Not on file  . Highest education level: Not on file  Occupational History  . Occupation:  Information systems manager-- laid off 2015    Comment:    . Occupation: Engineer, materials  Social Needs  . Financial resource strain: Not on file  . Food insecurity:    Worry: Not on file    Inability: Not on file  . Transportation needs:    Medical: Not on file    Non-medical: Not on file  Tobacco Use  . Smoking status: Former Smoker    Packs/day: 1.00    Types: Cigarettes    Last attempt to quit: 01/02/2018    Years since quitting: 0.6  . Smokeless tobacco: Never  Used  . Tobacco comment:    Substance and Sexual Activity  . Alcohol use: Yes    Alcohol/week: 0.0 standard drinks    Comment: rare  . Drug use: No  . Sexual activity: Not on file  Lifestyle  . Physical activity:    Days per week: Not on file    Minutes per session: Not on file  . Stress: Not on file  Relationships  . Social connections:    Talks on phone: Not on file    Gets together: Not on file    Attends religious service: Not on file    Active member of club or organization: Not on file    Attends meetings of clubs or organizations: Not on file    Relationship status: Not on file  . Intimate partner violence:    Fear of  current or ex partner: Not on file    Emotionally abused: Not on file    Physically abused: Not on file    Forced sexual activity: Not on file  Other Topics Concern  . Not on file  Social History Narrative   Widowed 10/12   Engaged with 2 year relationship--but he died 09/14/2023    Family History  Problem Relation Age of Onset  . Hypertension Father   . Heart attack Father   . Diabetes Father   . Hypertension Mother   . Tremor Mother   . Hypertension Brother   . Diabetes Brother   . Hypertension Brother   . Cancer Paternal Grandmother        Lung  . Tremor Brother   . Colon cancer Neg Hx   . Esophageal cancer Neg Hx   . Pancreatic cancer Neg Hx   . Rectal cancer Neg Hx   . Stomach cancer Neg Hx   . Breast cancer Neg Hx     Allergies  Allergen Reactions  . Bupropion Hcl     REACTION: rash, nausea, bad taste in mouth, blurred vision  . Lisinopril     REACTION: cough,dizziness  . Mirtazapine     REACTION: unspecified  . Mysoline [Primidone] Hives    Likely cause of hives    Medication list reviewed and updated in full in [place].  GEN: No fevers, chills. Nontoxic. Primarily MSK c/o today. MSK: Detailed in the HPI GI: tolerating PO intake without difficulty Neuro: No numbness, parasthesias, or tingling associated. Otherwise the pertinent positives of the ROS are noted above.   Objective:   BP 118/84   Pulse 79   Temp 98.3 F (36.8 C) (Oral)   Ht 5\' 2"  (1.575 m)   Wt 142 lb 4 oz (64.5 kg)   LMP 07/24/2015   BMI 26.02 kg/m    GEN: WDWN, NAD, Non-toxic, Alert & Oriented x 3 HEENT: Atraumatic, Normocephalic.  Ears and Nose: No external deformity. EXTR: No clubbing/cyanosis/edema NEURO: minimally weightbearing PSYCH: Normally interactive. Conversant. Not depressed or anxious appearing.  Calm demeanor.    Patient has no tenderness at the medial malleolus or lateral malleolus.  There is no tenderness at the ATFL, CFL, or deltoid ligaments.  Anterior  drawer testing is negative.  Nontender throughout the toes.  Nontender throughout all metatarsals.  The midfoot is tender almost in its entirety, with greatest tenderness laterally.  She also does have tenderness at the medial cuneiform.  No significant navicular tenderness.  There is some mild swelling with no bruising on the dorsum of the foot.  Radiology: Dg Foot Complete Left  Result Date: 08/15/2018 CLINICAL DATA:  Left foot pain, swelling and limited weight-bearing since falling 2 days ago. EXAM: LEFT FOOT - COMPLETE 3+ VIEW COMPARISON:  None. FINDINGS: The mineralization appears adequate. There is a suspected fracture involving the medial base of the middle cuneiform, best seen on the PA view. No other evidence of acute fracture or dislocation. Os peronei noted. The joint spaces are maintained. There appears to be mild soft tissue swelling dorsally and medially in the midfoot. IMPRESSION: Suspected small intra-articular fracture of the medial cuneiform. Electronically Signed   By: Richardean Sale M.D.   On: 08/15/2018 17:08    Assessment and Plan:   Closed nondisplaced fracture of medial cuneiform of left foot, initial encounter  Acute foot pain, left  Isolated medial cuneiform fractures are an extremely uncommon entity.  Secondary to limited finances, we are unable to obtain advanced imaging of the foot.  Certainly, the patient has probable midfoot sprain, multiple areas as well as bone bruising throughout much of the midfoot.  Other occult fracture cannot be excluded.  I do not think that the patient given age and stability and balance would do well in a cast, short of putting her in a wheelchair.  I am going to place the patient in a cam walker boot with partial weightbearing.  They do have a walker at home.  I would expect approximately 8 weeks as needed time for immobilization.  I reviewed with the patient that if she does poorly and does not heal, which is a possibility, I will  likely want to refer her to foot and ankle surgeon.  Nevertheless, she is a reasonable candidate for conservative management and treatment of this injury.  Risk factors include smoking.  Follow-up: Return in about 3 weeks (around 09/07/2018).  Signed,  Maud Deed. Copland, MD   Allergies as of 08/17/2018      Reactions   Bupropion Hcl    REACTION: rash, nausea, bad taste in mouth, blurred vision   Lisinopril    REACTION: cough,dizziness   Mirtazapine    REACTION: unspecified   Mysoline [primidone] Hives   Likely cause of hives      Medication List        Accurate as of 08/17/18 11:59 PM. Always use your most recent med list.          clonazePAM 0.5 MG tablet Commonly known as:  KLONOPIN Take 1 tablet by mouth 2 (two) times daily as needed.   fexofenadine 180 MG tablet Commonly known as:  ALLEGRA Take 180 mg by mouth daily as needed.   FLUoxetine 20 MG capsule Commonly known as:  PROZAC TAKE 1 CAPSULE(20 MG) BY MOUTH DAILY   gabapentin 100 MG capsule Commonly known as:  NEURONTIN Take 2 capsules by mouth 3 (three) times daily.   HYDROcodone-acetaminophen 5-325 MG tablet Commonly known as:  NORCO/VICODIN Take 1 tablet by mouth every 6 (six) hours as needed for moderate pain.   multivitamin tablet Take 1 tablet by mouth daily.   nortriptyline 10 MG capsule Commonly known as:  PAMELOR Take by mouth.   pantoprazole 40 MG tablet Commonly known as:  PROTONIX TAKE 1 TABLET(40 MG) BY MOUTH DAILY   traZODone 50 MG tablet Commonly known as:  DESYREL Take 1/2-1 tab nightly   venlafaxine XR 75 MG 24 hr capsule Commonly known as:  EFFEXOR-XR TAKE 3 CAPSULES BY MOUTH ONCE DAILY

## 2018-09-02 ENCOUNTER — Other Ambulatory Visit: Payer: Self-pay | Admitting: Internal Medicine

## 2018-09-06 ENCOUNTER — Ambulatory Visit (INDEPENDENT_AMBULATORY_CARE_PROVIDER_SITE_OTHER)
Admission: RE | Admit: 2018-09-06 | Discharge: 2018-09-06 | Disposition: A | Discharge status: Home / Self Care | Payer: Self-pay | Source: Ambulatory Visit | Attending: Family Medicine | Admitting: Family Medicine

## 2018-09-06 ENCOUNTER — Encounter: Payer: Self-pay | Admitting: Family Medicine

## 2018-09-06 ENCOUNTER — Ambulatory Visit: Payer: Self-pay | Admitting: Family Medicine

## 2018-09-06 VITALS — BP 110/80 | HR 66 | Temp 98.0°F | Ht 62.0 in | Wt 147.8 lb

## 2018-09-06 DIAGNOSIS — S92245A Nondisplaced fracture of medial cuneiform of left foot, initial encounter for closed fracture: Secondary | ICD-10-CM

## 2018-09-06 DIAGNOSIS — M79672 Pain in left foot: Secondary | ICD-10-CM

## 2018-09-06 NOTE — Progress Notes (Signed)
Dr. Frederico Hamman T. Copland, MD, [facility] Medicine [place], [address] Phone: [phone] Fax: [phone]  09/06/2018  Patient: Heather Russo, MRN: [id number], DOB: 1966-06-26, 52 y.o.  Primary Physician:  [name], MD   Chief Complaint  Patient presents with  . Follow-up    Left Foot Fx   Subjective:   Heather Russo is a 52 y.o. very pleasant female patient who presents with the following:  L foot pain: a little bit better, swelling improved, no significant bruising now. She is partial to non-weightbearing with a walker. She reports compliance with wearing cam  Make PNWB  08/17/2018 Last OV with Owens Loffler, MD  DOI 08/13/2018  The patient was referred to me for management for a medial cuneiform fracture, as well as midfoot injury.  She stepped off of a very awkward angle with extreme dorsiflexion as well as inversion on Sunday.  At that time, she was unable to ambulate and bear weight.  Subsequently she came to our office yesterday after she had persistent pain and swelling.  She comes in today bearing weight on both feet, and she is using one crutch.  She does have 2 crutches at home.  Medial cuneiform fracture.    Walker Sunday late after   Past Medical History, Surgical History, Social History, Family History, Problem List, Medications, and Allergies have been reviewed and updated if relevant.  Patient Active Problem List   Diagnosis Date Noted  . Trochanteric bursitis of right hip 08/03/2018  . Epicondylitis, lateral (tennis elbow) 08/03/2018  . Narcotic dependence ([place]) 08/03/2018  . Meralgia paresthetica of right side 04/24/2018  . Encounter for chronic pain management 05/17/2017  . Smoker 05/02/2017  . Special screening for malignant neoplasms, colon 05/28/2016  . Esophageal dysphagia 05/18/2016  . MDD (major depressive disorder), recurrent, in partial remission ([place]) 12/24/2015  . Chronic  back pain 12/24/2015  . Routine general medical examination at a health care facility 04/03/2014  . Essential tremor 01/22/2011  . COMMON MIGRAINE 04/03/2007  . Essential hypertension, benign 04/03/2007  . ALLERGIC RHINITIS 04/03/2007  . GERD 04/03/2007    Past Medical History:  Diagnosis Date  . Allergic rhinitis   . Allergy   . Anxiety   . Cancer ([place])    skin  . Depression   . Depression   . GERD (gastroesophageal reflux disease)   . HLD (hyperlipidemia)    mild  . HTN (hypertension)   . Migraine   . 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 Syncope and collapse   . Tremor, essential     Past Surgical History:  Procedure Laterality Date  . ESOPHAGOGASTRODUODENOSCOPY  05/05/06  . LAPAROSCOPIC CHOLECYSTECTOMY  03/2008  . wisdom teeth removeal      Social History   Socioeconomic History  . Marital status: Widowed    Spouse name: Not on file  . Number of children: 1  . Years of education: Not on file  . Highest education level: Not on file  Occupational History  . Occupation:  Information systems manager-- laid off 2015    Comment:    . Occupation: Engineer, materials  Social Needs  . Financial resource strain: Not on file  . Food insecurity:    Worry: Not on file    Inability: Not on file  . Transportation needs:    Medical: Not on file    Non-medical: Not on file  Tobacco Use  . Smoking status: Former Smoker    Packs/day: 1.00    Types: Cigarettes    Last attempt to quit: 01/02/2018    Years since quitting: 0.6  . Smokeless tobacco: Never Used  . Tobacco comment:    Substance and Sexual Activity  . Alcohol use: Yes    Alcohol/week: 0.0 standard drinks    Comment: rare  . Drug use: No  . Sexual activity: Not on file  Lifestyle  . Physical activity:    Days per week: Not on file    Minutes per session: Not on file  . Stress: Not on file  Relationships  . Social connections:    Talks on phone: Not on file    Gets together: Not on file     Attends religious service: Not on file    Active member of club or organization: Not on file    Attends meetings of clubs or organizations: Not on file    Relationship status: Not on file  . Intimate partner violence:    Fear of current or ex partner: Not on file    Emotionally abused: Not on file    Physically abused: Not on file    Forced sexual activity: Not on file  Other Topics Concern  . Not on file  Social History Narrative   Widowed 10/12   Engaged with 2 year relationship--but he died Sep 27, 2023    Family History  Problem Relation Age of Onset  . Hypertension Father   . Heart attack Father   . Diabetes Father   . Hypertension Mother   . Tremor Mother   . Hypertension Brother   . Diabetes Brother   . Hypertension Brother   . Cancer Paternal Grandmother        Lung  . Tremor Brother   . Colon cancer Neg Hx   . Esophageal cancer Neg Hx   . Pancreatic cancer Neg Hx   . Rectal cancer Neg Hx   . Stomach cancer Neg Hx   . Breast cancer Neg Hx     Allergies  Allergen Reactions  . Bupropion Hcl     REACTION: rash, nausea, bad taste in mouth, blurred vision  . Lisinopril     REACTION: cough,dizziness  . Mirtazapine     REACTION: unspecified  . Mysoline [Primidone] Hives    Likely cause of hives    Medication list reviewed and updated in full in [place].  GEN: No fevers, chills. Nontoxic. Primarily MSK c/o today. MSK: Detailed in the HPI GI: tolerating PO intake without difficulty Neuro: No numbness, parasthesias, or tingling associated. Otherwise the pertinent positives of the ROS are noted above.   Objective:   BP 110/80   Pulse 66   Temp 98 F (36.7 C) (Oral)   Ht 5\' 2"  (1.575 m)   Wt 147 lb 12 oz (67 kg)   LMP 07/24/2015   BMI 27.02 kg/m    GEN: WDWN, NAD, Non-toxic, Alert & Oriented x 3 HEENT: Atraumatic, Normocephalic.  Ears and Nose: No external deformity. EXTR: No clubbing/cyanosis/edema NEURO: antalgic gait PSYCH: Normally  interactive. Conversant. Not depressed or anxious appearing.  Calm demeanor.    Mild sdorsal swelling along midfoot and remains ttp, ttp medially, NT at ankle, all ankle ligaments and toes, along with metatarsals.   Radiology: Dg Foot Complete Left  Result Date: 2018/09/26 CLINICAL DATA:  Closed fracture of medial cuneiform LEFT foot, acute LEFT foot pain, follow-up EXAM: LEFT FOOT -  COMPLETE 3+ VIEW COMPARISON:  08/15/2018 FINDINGS: Again identified small displaced fracture fragment at the medial base of the medial cuneiform LEFT foot consistent with fracture. Osseous mineralization normal for technique. Joint spaces preserved. No additional fracture, dislocation, or bone destruction. Accessory ossicle identified adjacent to the lateral margin of the cuboid. IMPRESSION: Again identified minimally displaced fracture of the medial cuneiform at its medial proximal base. Electronically Signed   By: Lavonia Dana M.D.   On: 09/06/2018 16:44   Dg Foot Complete Left  Result Date: 08/15/2018 CLINICAL DATA:  Left foot pain, swelling and limited weight-bearing since falling 2 days ago. EXAM: LEFT FOOT - COMPLETE 3+ VIEW COMPARISON:  None. FINDINGS: The mineralization appears adequate. There is a suspected fracture involving the medial base of the middle cuneiform, best seen on the PA view. No other evidence of acute fracture or dislocation. Os peronei noted. The joint spaces are maintained. There appears to be mild soft tissue swelling dorsally and medially in the midfoot. IMPRESSION: Suspected small intra-articular fracture of the medial cuneiform. Electronically Signed   By: Richardean Sale M.D.   On: 08/15/2018 17:08    Assessment and Plan:   Closed nondisplaced fracture of medial cuneiform of left foot, initial encounter - Plan: DG Foot Complete Left  Acute foot pain, left - Plan: DG Foot Complete Left  Persistent midfoot sprain and medial cuneiform fx at medial proximal base  Follow-up: Return in  about 3 weeks (around 09/27/2018) for with X-Rays prior to appointment.  Orders Placed This Encounter  Procedures  . DG Foot Complete Left    Signed,  Spencer T. Copland, MD   Allergies as of 09/06/2018      Reactions   Bupropion Hcl    REACTION: rash, nausea, bad taste in mouth, blurred vision   Lisinopril    REACTION: cough,dizziness   Mirtazapine    REACTION: unspecified   Mysoline [primidone] Hives   Likely cause of hives      Medication List        Accurate as of 09/06/18 11:59 PM. Always use your most recent med list.          clonazePAM 0.5 MG tablet Commonly known as:  KLONOPIN Take 1 tablet by mouth 2 (two) times daily as needed.   fexofenadine 180 MG tablet Commonly known as:  ALLEGRA Take 180 mg by mouth daily as needed.   FLUoxetine 20 MG capsule Commonly known as:  PROZAC TAKE 1 CAPSULE(20 MG) BY MOUTH DAILY   gabapentin 100 MG capsule Commonly known as:  NEURONTIN Take 2 capsules by mouth 3 (three) times daily.   HYDROcodone-acetaminophen 5-325 MG tablet Commonly known as:  NORCO/VICODIN Take 1 tablet by mouth every 6 (six) hours as needed for moderate pain.   multivitamin tablet Take 1 tablet by mouth daily.   nortriptyline 10 MG capsule Commonly known as:  PAMELOR Take by mouth.   pantoprazole 40 MG tablet Commonly known as:  PROTONIX TAKE 1 TABLET(40 MG) BY MOUTH DAILY   traZODone 50 MG tablet Commonly known as:  DESYREL Take 1/2-1 tab nightly   venlafaxine XR 75 MG 24 hr capsule Commonly known as:  EFFEXOR-XR TAKE 3 CAPSULES BY MOUTH ONCE DAILY

## 2018-09-07 ENCOUNTER — Other Ambulatory Visit: Payer: Self-pay | Admitting: Internal Medicine

## 2018-09-07 ENCOUNTER — Telehealth: Payer: Self-pay | Admitting: Internal Medicine

## 2018-09-07 ENCOUNTER — Encounter: Payer: Self-pay | Admitting: Family Medicine

## 2018-09-07 MED ORDER — VENLAFAXINE HCL ER 75 MG PO CP24: 225.0000 mg | ORAL_CAPSULE | Freq: Every day | ORAL | 1 refills | Status: DC

## 2018-09-07 NOTE — Telephone Encounter (Signed)
Received a refill request from [facility] in [place]. I sent it there.

## 2018-09-07 NOTE — Telephone Encounter (Signed)
I was unable to speak with pt to verify what pharmacy med was to go to. venlafaxine XR 75 mg was sent electronically to [facility] on 09/04/18. Pt has 2 other pharmacies on pharmacy list.

## 2018-09-07 NOTE — Telephone Encounter (Signed)
Pt calling back.  States [facility] is the correct pharmacy.  But they are telling her that they have nothing. Pt can be reached at [phone]

## 2018-09-07 NOTE — Telephone Encounter (Signed)
Sent rx to [facility]

## 2018-09-07 NOTE — Telephone Encounter (Signed)
Copied from [place] [id number]. Topic: Quick Communication - See Telephone Encounter >> Sep 07, 2018  3:32 PM Blase Mess A wrote: CRM for notification. See Telephone encounter for: 09/07/18. Patient states that venlafaxine XR (EFFEXOR-XR) 75 MG 24 hr capsule [[id number]]  script was not sent to the pharmacy.  The computer states that it was sent 10/114/19.  Can the script be resent?

## 2018-09-13 ENCOUNTER — Other Ambulatory Visit: Payer: Self-pay | Admitting: Internal Medicine

## 2018-09-13 NOTE — Telephone Encounter (Signed)
Copied from [place] [id number]. Topic: Quick Communication - Rx Refill/Question >> Sep 13, 2018  3:30 PM Selinda Flavin B, [place] wrote: Medication: HYDROcodone-acetaminophen (NORCO/VICODIN) 5-325 MG tablet  Has the patient contacted their pharmacy? Yes.  Told her to call office (Agent: If no, request that the patient contact the pharmacy for the refill.) (Agent: If yes, when and what did the pharmacy advise?)  Preferred Pharmacy (with phone number or street name): [facility], [place]: Please be advised that RX refills may take up to 3 business days. We ask that you follow-up with your pharmacy.

## 2018-09-13 NOTE — Telephone Encounter (Signed)
  Requesting refill of HYDROcodone-acetaminophen (NORCO/VICODIN) 5-325 MG tablet     [facility] [place], [place]

## 2018-09-14 MED ORDER — HYDROCODONE-ACETAMINOPHEN 5-325 MG PO TABS: 1.0000 | ORAL_TABLET | Freq: Four times a day (QID) | ORAL | 0 refills | Status: DC | PRN

## 2018-09-14 NOTE — Telephone Encounter (Signed)
Name of Medication: Hydrocodone apap 5-325 mg Name of Pharmacy: [facility] Last Fill or Written Date and Quantity: # 58 on 08/09/18 Last Office Visit and Type: 08/03/18 3 month FU Next Office Visit and Type: 10/30/18 3 month FU Last Controlled Substance Agreement Date: 01-16-18 Last UDS:01-16-18

## 2018-09-27 ENCOUNTER — Ambulatory Visit (INDEPENDENT_AMBULATORY_CARE_PROVIDER_SITE_OTHER): Payer: Self-pay

## 2018-09-27 ENCOUNTER — Ambulatory Visit: Payer: Self-pay | Admitting: Family Medicine

## 2018-09-27 VITALS — BP 120/76 | HR 88 | Temp 98.1°F | Ht 62.0 in | Wt 147.0 lb

## 2018-09-27 DIAGNOSIS — S92245A Nondisplaced fracture of medial cuneiform of left foot, initial encounter for closed fracture: Secondary | ICD-10-CM | POA: Insufficient documentation

## 2018-09-27 DIAGNOSIS — S92245D Nondisplaced fracture of medial cuneiform of left foot, subsequent encounter for fracture with routine healing: Secondary | ICD-10-CM

## 2018-09-27 NOTE — Patient Instructions (Signed)
Nice to meet you  Please try to ice the area if its sore  Please walk around without the boot on. You may need to put it on at the end of the day or if you're walking somewhere new I will call you with the results from today  Please follow up in 3-4 weeks.

## 2018-09-27 NOTE — Assessment & Plan Note (Signed)
Appears to have healing on ultrasound.  Has been in the cam walker for 6 weeks.  No significant swelling or bruising today. -X-ray today. -Counseled on home exercise therapy and supportive care. -Counseled on how to come out of the cam walker over the next couple of weeks. -Provided Pen[place] samples and consent more and if she gets relief with pain with this. -Can follow-up in 3 to 4 weeks.

## 2018-09-27 NOTE — Progress Notes (Signed)
Heather Russo - 52 y.o. female MRN [id number]  Date of birth: 08/21/1966  SUBJECTIVE:  Including CC & ROS.  Chief Complaint  Patient presents with  . Follow-up    pt. here today to follow-up with left foot fracture; she reports improved pain since boot was placed on foot; current pain level is 1/10 on a 0-10 pain scale    Heather Russo is a 52 y.o. female that is presenting with a fracture of the medial cuneiform on the left foot.  She initially presented on 9/24.  She has been placed in a cam walker for the past 6 weeks.  She denies coming out of it to walk.  She has been using a rolling walker as well.  Denies any significant pain.  Reports no bruising or swelling.  Independent review of the left foot x-ray from 10/16 shows minimally displaced fracture of the medial cuneiform   Review of Systems  Constitutional: Negative for fever.  HENT: Negative for congestion.   Respiratory: Negative for cough.   Cardiovascular: Negative for chest pain.  Gastrointestinal: Negative for abdominal pain.  Musculoskeletal: Positive for gait problem.  Skin: Negative for color change.  Neurological: Negative for weakness.  Hematological: Negative for adenopathy.  Psychiatric/Behavioral: Negative for agitation.    HISTORY: Past Medical, Surgical, Social, and Family History Reviewed & Updated per EMR.   Pertinent Historical Findings include:  Past Medical History:  Diagnosis Date  . Allergic rhinitis   . Allergy   . Anxiety   . Cancer ([place])    skin  . Depression   . Depression   . GERD (gastroesophageal reflux disease)   . HLD (hyperlipidemia)    mild  . HTN (hypertension)   . Migraine   . 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 Syncope and collapse   . Tremor, essential     Past Surgical History:  Procedure Laterality Date  . ESOPHAGOGASTRODUODENOSCOPY  05/05/06  . LAPAROSCOPIC CHOLECYSTECTOMY  03/2008  . wisdom teeth removeal      Allergies  Allergen Reactions   . Bupropion Hcl     REACTION: rash, nausea, bad taste in mouth, blurred vision  . Lisinopril     REACTION: cough,dizziness  . Mirtazapine     REACTION: unspecified  . Mysoline [Primidone] Hives    Likely cause of hives    Family History  Problem Relation Age of Onset  . Hypertension Father   . Heart attack Father   . Diabetes Father   . Hypertension Mother   . Tremor Mother   . Hypertension Brother   . Diabetes Brother   . Hypertension Brother   . Cancer Paternal Grandmother        Lung  . Tremor Brother   . Colon cancer Neg Hx   . Esophageal cancer Neg Hx   . Pancreatic cancer Neg Hx   . Rectal cancer Neg Hx   . Stomach cancer Neg Hx   . Breast cancer Neg Hx      Social History   Socioeconomic History  . Marital status: Widowed    Spouse name: Not on file  . Number of children: 1  . Years of education: Not on file  . Highest education level: Not on file  Occupational History  . Occupation:  Information systems manager-- laid off 2015    Comment:    . Occupation: Engineer, materials  Social Needs  . Financial resource strain: Not on file  .  Food insecurity:    Worry: Not on file    Inability: Not on file  . Transportation needs:    Medical: Not on file    Non-medical: Not on file  Tobacco Use  . Smoking status: Former Smoker    Packs/day: 1.00    Types: Cigarettes    Last attempt to quit: 01/02/2018    Years since quitting: 0.7  . Smokeless tobacco: Never Used  . Tobacco comment:    Substance and Sexual Activity  . Alcohol use: Yes    Alcohol/week: 0.0 standard drinks    Comment: rare  . Drug use: No  . Sexual activity: Not on file  Lifestyle  . Physical activity:    Days per week: Not on file    Minutes per session: Not on file  . Stress: Not on file  Relationships  . Social connections:    Talks on phone: Not on file    Gets together: Not on file    Attends religious service: Not on file    Active member of club or organization: Not on file    Attends  meetings of clubs or organizations: Not on file    Relationship status: Not on file  . Intimate partner violence:    Fear of current or ex partner: Not on file    Emotionally abused: Not on file    Physically abused: Not on file    Forced sexual activity: Not on file  Other Topics Concern  . Not on file  Social History Narrative   Widowed 10/12   Engaged with 2 year relationship--but he died 10-03-2023     PHYSICAL EXAM:  VS: BP 120/76 (BP Location: Left Arm, Cuff Size: Normal)   Pulse 88   Temp 98.1 F (36.7 C) (Oral)   Ht 5\' 2"  (1.575 m)   Wt 147 lb (66.7 kg)   LMP 07/24/2015   SpO2 98%   BMI 26.89 kg/m  Physical Exam Gen: NAD, alert, cooperative with exam, well-appearing ENT: normal lips, normal nasal mucosa,  Eye: normal EOM, normal conjunctiva and lids CV:  no edema, +2 pedal pulses   Resp: no accessory muscle use, non-labored,  Skin: no rashes, no areas of induration  Neuro: normal tone, normal sensation to touch Psych:  normal insight, alert and oriented MSK:  Left foot:  No ecchymosis or swelling. Mild tenderness to palpation at the base of the medial cuneiform. Normal range of motion. Normal strength to resistance. Fairly cavus foot. Neurovascularly intact  Limited ultrasound: Left foot:  Normal-appearing first tarsometatarsal joint Base of the medial cuneiform has some inconsistencies which likely demonstrates a healing fracture.  There is no increased vascularity in this region  Summary: Healing medial cuneiform fracture  Ultrasound and interpretation by Clearance Coots, MD    ASSESSMENT & PLAN:   Closed nondisplaced fracture of medial cuneiform of left foot Appears to have healing on ultrasound.  Has been in the cam walker for 6 weeks.  No significant swelling or bruising today. -X-ray today. -Counseled on home exercise therapy and supportive care. -Counseled on how to come out of the cam walker over the next couple of weeks. -Provided Pennsaid  samples and consent more and if she gets relief with pain with this. -Can follow-up in 3 to 4 weeks.

## 2018-09-28 ENCOUNTER — Telehealth: Payer: Self-pay | Admitting: Family Medicine

## 2018-09-28 NOTE — Telephone Encounter (Signed)
Left VM for patient. If she calls back please have her speak with a nurse/CMA and inform that her xray shows healing of the fracture. The [facility] can report results to patient.   If any questions then please take the best time and phone number to call and I will try to call her back.   Rosemarie Ax, MD [facility] 09/28/2018, 10:37 AM

## 2018-10-25 ENCOUNTER — Other Ambulatory Visit: Payer: Self-pay

## 2018-10-25 NOTE — Telephone Encounter (Signed)
Name of Medication: hydrocodone apap 5-325 mg Name of Pharmacy: [facility] or Written Date and Quantity: # 75 on 09/14/18 Last Office Visit and Type: pain mgt FU 08/03/18 Next Office Visit and Type: 10/30/18 3 mth FU Last Controlled Substance Agreement Date: 01/16/18 Last UDS:01/16/18

## 2018-10-26 MED ORDER — HYDROCODONE-ACETAMINOPHEN 5-325 MG PO TABS: 1.0000 | ORAL_TABLET | Freq: Four times a day (QID) | ORAL | 0 refills | Status: DC | PRN

## 2018-10-30 ENCOUNTER — Ambulatory Visit: Payer: Self-pay | Admitting: Internal Medicine

## 2018-11-17 ENCOUNTER — Other Ambulatory Visit: Payer: Self-pay | Admitting: Internal Medicine

## 2018-11-23 ENCOUNTER — Encounter: Payer: Self-pay | Admitting: Internal Medicine

## 2018-11-23 ENCOUNTER — Ambulatory Visit (INDEPENDENT_AMBULATORY_CARE_PROVIDER_SITE_OTHER): Payer: BLUE CROSS/BLUE SHIELD | Admitting: Internal Medicine

## 2018-11-23 VITALS — BP 122/78 | HR 80 | Temp 98.7°F | Ht 62.0 in | Wt 146.0 lb

## 2018-11-23 DIAGNOSIS — M549 Dorsalgia, unspecified: Secondary | ICD-10-CM | POA: Diagnosis not present

## 2018-11-23 DIAGNOSIS — F3341 Major depressive disorder, recurrent, in partial remission: Secondary | ICD-10-CM

## 2018-11-23 DIAGNOSIS — G8929 Other chronic pain: Secondary | ICD-10-CM | POA: Diagnosis not present

## 2018-11-23 DIAGNOSIS — F1120 Opioid dependence, uncomplicated: Secondary | ICD-10-CM | POA: Diagnosis not present

## 2018-11-23 MED ORDER — PANTOPRAZOLE SODIUM 40 MG PO TBEC: DELAYED_RELEASE_TABLET | ORAL | 3 refills | Status: DC

## 2018-11-23 MED ORDER — HYDROCODONE-ACETAMINOPHEN 5-325 MG PO TABS: 1.0000 | ORAL_TABLET | Freq: Four times a day (QID) | ORAL | 0 refills | Status: DC | PRN

## 2018-11-23 MED ORDER — NORTRIPTYLINE HCL 10 MG PO CAPS: 10.0000 mg | ORAL_CAPSULE | Freq: Every day | ORAL | 3 refills | Status: DC

## 2018-11-23 MED ORDER — FLUOXETINE HCL 20 MG PO CAPS: ORAL_CAPSULE | ORAL | 3 refills | Status: DC

## 2018-11-23 NOTE — Assessment & Plan Note (Signed)
Ongoing issues Wonders about brace to keep shoulders back--may be useful Same meds

## 2018-11-23 NOTE — Assessment & Plan Note (Signed)
CSRS reviewed No concerns 

## 2018-11-23 NOTE — Progress Notes (Signed)
Subjective:    Patient ID: Heather Russo, female    DOB: June 03, 1966, 53 y.o.   MRN: [id number]  HPI Here for follow up of chronic pain and narcotic dependence  Had broken left foot  Better now Back to full weight bearing, etc  "My life sucks" A lot on her mind--"weighing me down" Still grieves Mom having a hard time--not getting around much Daughter going through separation--moved in with her (and he 2 kids-- 5/8) Not a fan of counseling Not involved with church  The back pain is ongoing--especially up from her shoulder blade to shoulder Continues on the hydrocodone Gabapentin as well --from neurologist Takes 1/2 clonazepam bid and then whole one at bedtime  Still gets some panic attacks Chest pain, etc Calms and it resolves  Current Outpatient Medications on File Prior to Visit  Medication Sig Dispense Refill  . clonazePAM (KLONOPIN) 0.5 MG tablet Take 1 tablet by mouth 2 (two) times daily as needed.    . fexofenadine (ALLEGRA) 180 MG tablet Take 180 mg by mouth daily as needed.      Marland Kitchen FLUoxetine (PROZAC) 20 MG capsule TAKE 1 CAPSULE(20 MG) BY MOUTH DAILY 90 capsule 0  . gabapentin (NEURONTIN) 100 MG capsule Take 2 capsules by mouth 3 (three) times daily.    Marland Kitchen HYDROcodone-acetaminophen (NORCO/VICODIN) 5-325 MG tablet Take 1 tablet by mouth every 6 (six) hours as needed for moderate pain. 75 tablet 0  . Multiple Vitamin (MULTIVITAMIN) tablet Take 1 tablet by mouth daily.    . nortriptyline (PAMELOR) 10 MG capsule Take by mouth.    . pantoprazole (PROTONIX) 40 MG tablet TAKE 1 TABLET(40 MG) BY MOUTH DAILY 90 tablet 1  . traZODone (DESYREL) 50 MG tablet Take 1/2-1 tab nightly 30 tablet 11  . venlafaxine XR (EFFEXOR-XR) 75 MG 24 hr capsule TAKE 3 CAPSULES BY MOUTH ONCE DAILY 90 capsule 11   No current facility-administered medications on file prior to visit.     Allergies  Allergen Reactions  . Bupropion Hcl     REACTION: rash, nausea, bad taste in mouth, blurred vision    . Lisinopril     REACTION: cough,dizziness  . Mirtazapine     REACTION: unspecified  . Mysoline [Primidone] Hives    Likely cause of hives    Past Medical History:  Diagnosis Date  . Allergic rhinitis   . Allergy   . Anxiety   . Cancer ([place])    skin  . Depression   . Depression   . GERD (gastroesophageal reflux disease)   . HLD (hyperlipidemia)    mild  . HTN (hypertension)   . Migraine   . 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 Syncope and collapse   . Tremor, essential     Past Surgical History:  Procedure Laterality Date  . ESOPHAGOGASTRODUODENOSCOPY  05/05/06  . LAPAROSCOPIC CHOLECYSTECTOMY  03/2008  . wisdom teeth removeal      Family History  Problem Relation Age of Onset  . Hypertension Father   . Heart attack Father   . Diabetes Father   . Hypertension Mother   . Tremor Mother   . Hypertension Brother   . Diabetes Brother   . Hypertension Brother   . Cancer Paternal Grandmother        Lung  . Tremor Brother   . Colon cancer Neg Hx   . Esophageal cancer Neg Hx   . Pancreatic cancer Neg Hx   .  Rectal cancer Neg Hx   . Stomach cancer Neg Hx   . Breast cancer Neg Hx     Social History   Socioeconomic History  . Marital status: Widowed    Spouse name: Not on file  . Number of children: 1  . Years of education: Not on file  . Highest education level: Not on file  Occupational History  . Occupation:  Information systems manager-- laid off 2015    Comment:    . Occupation: Engineer, materials  Social Needs  . Financial resource strain: Not on file  . Food insecurity:    Worry: Not on file    Inability: Not on file  . Transportation needs:    Medical: Not on file    Non-medical: Not on file  Tobacco Use  . Smoking status: Former Smoker    Packs/day: 1.00    Types: Cigarettes    Last attempt to quit: 01/02/2018    Years since quitting: 0.8  . Smokeless tobacco: Never Used  . Tobacco comment:    Substance and Sexual Activity  .  Alcohol use: Yes    Alcohol/week: 0.0 standard drinks    Comment: rare  . Drug use: No  . Sexual activity: Not on file  Lifestyle  . Physical activity:    Days per week: Not on file    Minutes per session: Not on file  . Stress: Not on file  Relationships  . Social connections:    Talks on phone: Not on file    Gets together: Not on file    Attends religious service: Not on file    Active member of club or organization: Not on file    Attends meetings of clubs or organizations: Not on file    Relationship status: Not on file  . Intimate partner violence:    Fear of current or ex partner: Not on file    Emotionally abused: Not on file    Physically abused: Not on file    Forced sexual activity: Not on file  Other Topics Concern  . Not on file  Social History Narrative   Widowed 10/12   Engaged with 2 year relationship--but he died 09/18/2023   Review of Systems  Sleeps okay with the trazodone Appetite is off with the stress Weight is stable    Objective:   Physical Exam  Constitutional: She appears well-developed. No distress.  Neck: No thyromegaly present.  Cardiovascular: Normal rate, regular rhythm and normal heart sounds. Exam reveals no gallop.  No murmur heard. Respiratory: Effort normal and breath sounds normal. No respiratory distress. She has no wheezes. She has no rales.  Musculoskeletal:        General: No edema.  Lymphadenopathy:    She has no cervical adenopathy.  Psychiatric: She has a normal mood and affect. Her behavior is normal.           Assessment & Plan:

## 2018-11-23 NOTE — Assessment & Plan Note (Signed)
Having a hard time now---multiple stressors Continue the meds No suicidal ideation  not excited about trying counseling again

## 2018-11-27 LAB — PAIN MGMT, PROFILE 8 W/CONF, U
6 Acetylmorphine: NEGATIVE ng/mL (ref <10)
Alcohol Metabolites: NEGATIVE ng/mL (ref <500)
Alphahydroxyalprazolam: NEGATIVE ng/mL (ref <25)
Alphahydroxymidazolam: NEGATIVE ng/mL (ref <50)
Alphahydroxytriazolam: NEGATIVE ng/mL (ref <50)
Amphetamines: NEGATIVE ng/mL (ref <500)
Benzodiazepines: NEGATIVE ng/mL (ref <100)
Buprenorphine, Urine: NEGATIVE ng/mL (ref <5)
Cocaine Metabolite: NEGATIVE ng/mL (ref <150)
Codeine: NEGATIVE ng/mL (ref <50)
Creatinine: 239.7 mg/dL
Hydrocodone: 1804 ng/mL — ABNORMAL HIGH (ref <50)
Hydromorphone: 53 ng/mL — ABNORMAL HIGH (ref <50)
Hydroxyethylflurazepam: NEGATIVE ng/mL (ref <50)
Lorazepam: NEGATIVE ng/mL (ref <50)
MDMA: NEGATIVE ng/mL (ref <500)
Marijuana Metabolite: >500 ng/mL — ABNORMAL HIGH (ref <5)
Marijuana Metabolite: POSITIVE ng/mL — AB (ref <20)
Morphine: NEGATIVE ng/mL (ref <50)
Nordiazepam: NEGATIVE ng/mL (ref <50)
Norhydrocodone: 2991 ng/mL — ABNORMAL HIGH (ref <50)
Opiates: POSITIVE ng/mL — AB (ref <100)
Oxazepam: NEGATIVE ng/mL (ref <50)
Oxidant: NEGATIVE ug/mL (ref <200)
Oxycodone: NEGATIVE ng/mL (ref <100)
Temazepam: NEGATIVE ng/mL (ref <50)
pH: 6.24 (ref 4.5–9.0)

## 2018-12-15 ENCOUNTER — Telehealth: Payer: Self-pay | Admitting: Internal Medicine

## 2018-12-15 NOTE — Telephone Encounter (Signed)
Rx was sent 11-17-18. She needs to ask them to check her profile for new rxs. Spoke to pt.

## 2018-12-15 NOTE — Telephone Encounter (Signed)
Pt is requesting a refill on the Venlafaxine. Pt uses [facility] in [place]. Pt says she is out of the medication.

## 2018-12-26 ENCOUNTER — Other Ambulatory Visit: Payer: Self-pay

## 2018-12-26 NOTE — Telephone Encounter (Addendum)
Pharmacy said her insurance will only cover 14 days. Last filled 11-23-18. She paid for the remaining. I advised her to make sure the pharmacy sends me PA info so I can get her an extended refill option.

## 2018-12-27 MED ORDER — HYDROCODONE-ACETAMINOPHEN 5-325 MG PO TABS: 1.0000 | ORAL_TABLET | Freq: Four times a day (QID) | ORAL | 0 refills | Status: DC | PRN

## 2018-12-27 NOTE — Telephone Encounter (Signed)
Name of Medication: Hydrocodone Name of Pharmacy: [facility] Last Fill or Written Date and Quantity: 11-23-18 Last Office Visit and Type: 11-23-18 3 Month F/U Next Office Visit and Type: 03-22-19 3 Month F/U Last Controlled Substance Agreement Date: 11-23-18 Last UDS: 11-23-18

## 2018-12-29 ENCOUNTER — Other Ambulatory Visit: Payer: Self-pay

## 2018-12-29 ENCOUNTER — Other Ambulatory Visit: Payer: Self-pay | Admitting: Internal Medicine

## 2018-12-29 DIAGNOSIS — Z1231 Encounter for screening mammogram for malignant neoplasm of breast: Secondary | ICD-10-CM

## 2019-01-02 ENCOUNTER — Telehealth: Payer: Self-pay

## 2019-01-02 NOTE — Telephone Encounter (Signed)
Completed form on CoverMyMeds. Asked that it be urgent.

## 2019-01-16 ENCOUNTER — Ambulatory Visit
Admission: RE | Admit: 2019-01-16 | Discharge: 2019-01-16 | Disposition: A | Discharge status: Home / Self Care | Payer: BLUE CROSS/BLUE SHIELD | Source: Ambulatory Visit | Attending: Internal Medicine | Admitting: Internal Medicine

## 2019-01-16 DIAGNOSIS — Z1231 Encounter for screening mammogram for malignant neoplasm of breast: Secondary | ICD-10-CM | POA: Diagnosis not present

## 2019-01-24 ENCOUNTER — Telehealth: Payer: Self-pay

## 2019-01-24 IMAGING — MG MM DIGITAL DIAGNOSTIC BILAT W/ TOMO W/ CAD
9 of 13 series · 9 of 29 positions shown · non-contrast
Comparison: Previous exam(s).

CLINICAL DATA: Patient presents for a bilateral diagnostic
examination due to slight worsening over the past year of bilateral
long-standing nipple inversion.Patient is also due for a bilateral
mammogram as patient has not had a mammogram since 0666.

EXAM:
2D DIGITAL DIAGNOSTIC BILATERAL MAMMOGRAM WITH CAD AND ADJUNCT TOMO

[R XCCL]
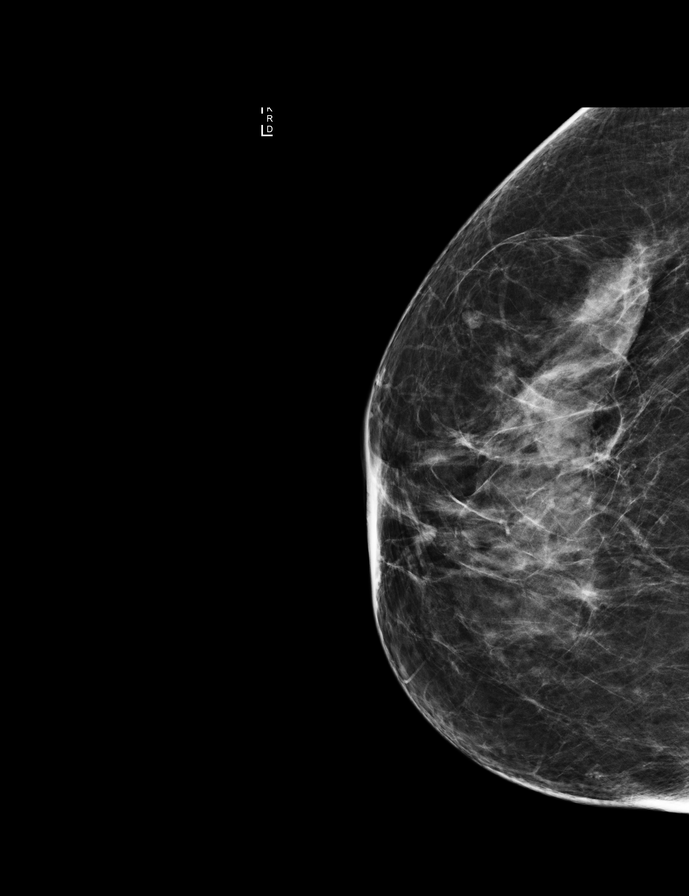

[R CC]
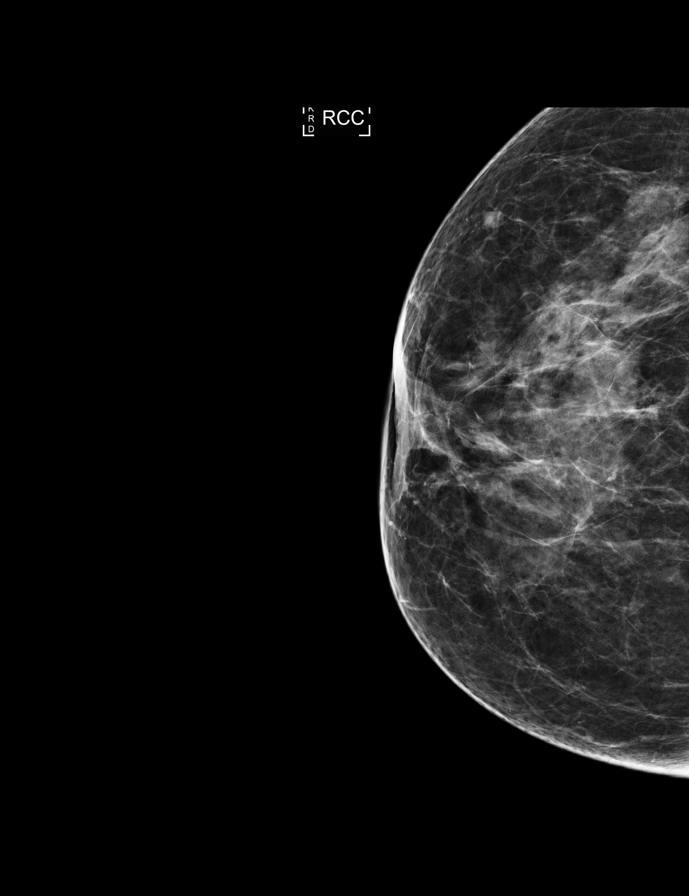

[R CC synth-2D]
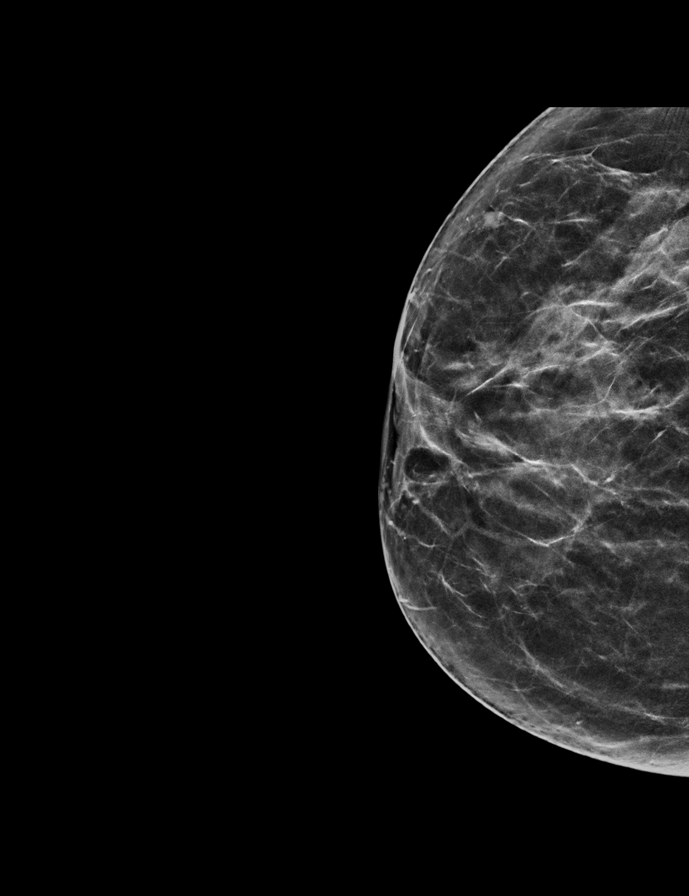

[L CC synth-2D]
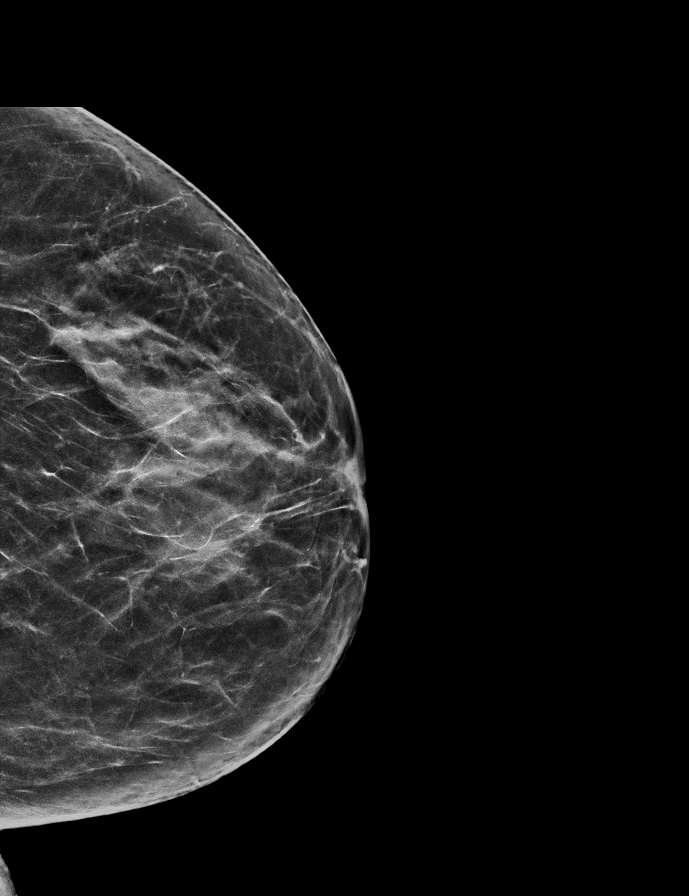

[L MLO synth-2D]
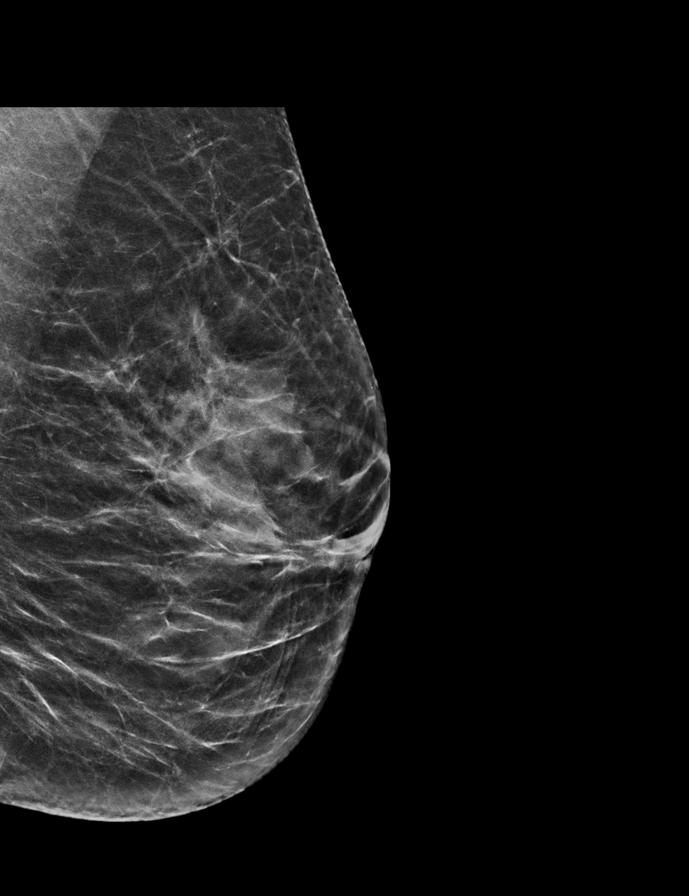

[L CC]
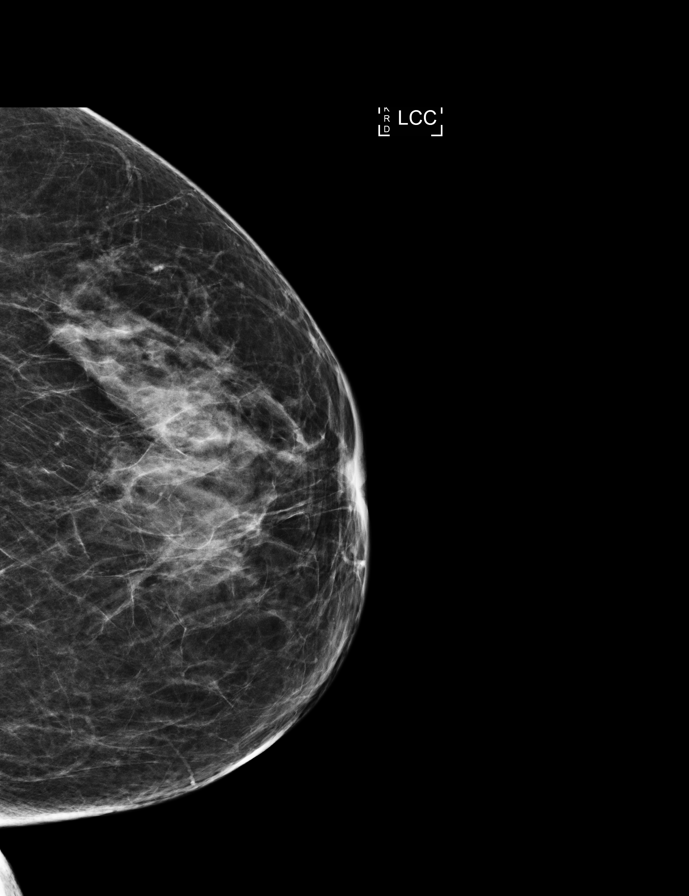

[R MLO]
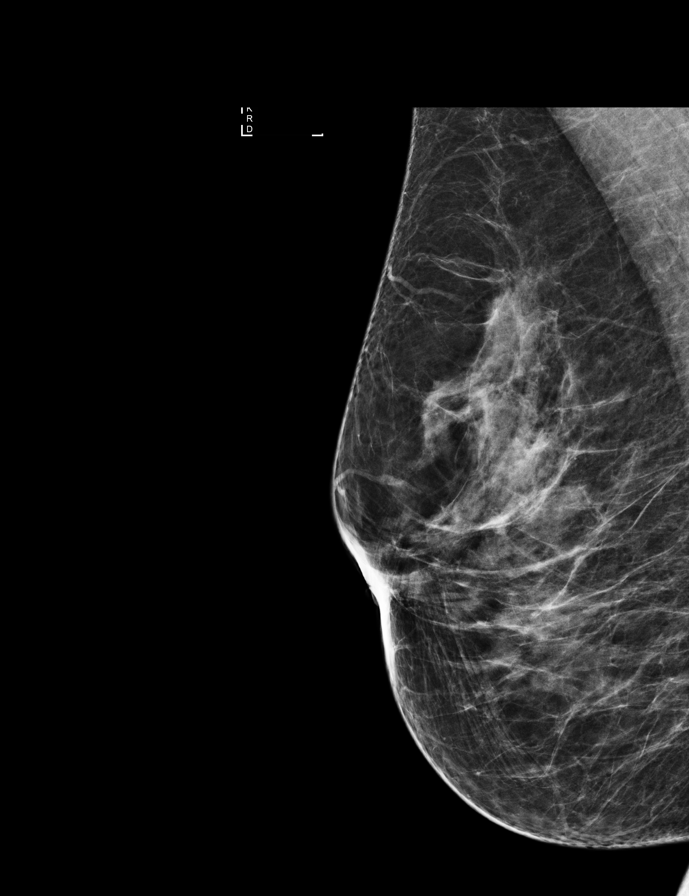

[R MLO synth-2D]
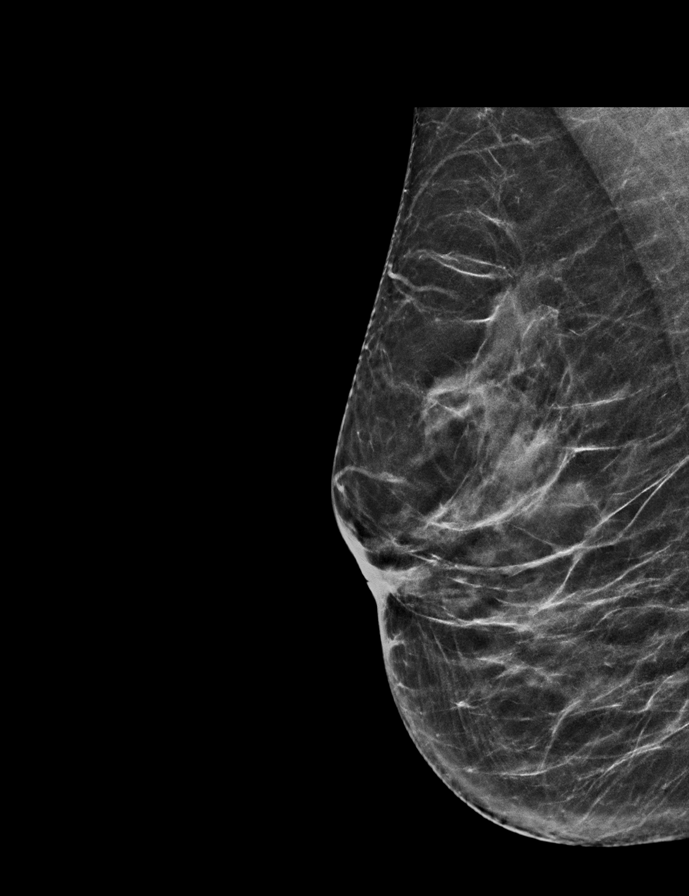

[L MLO]
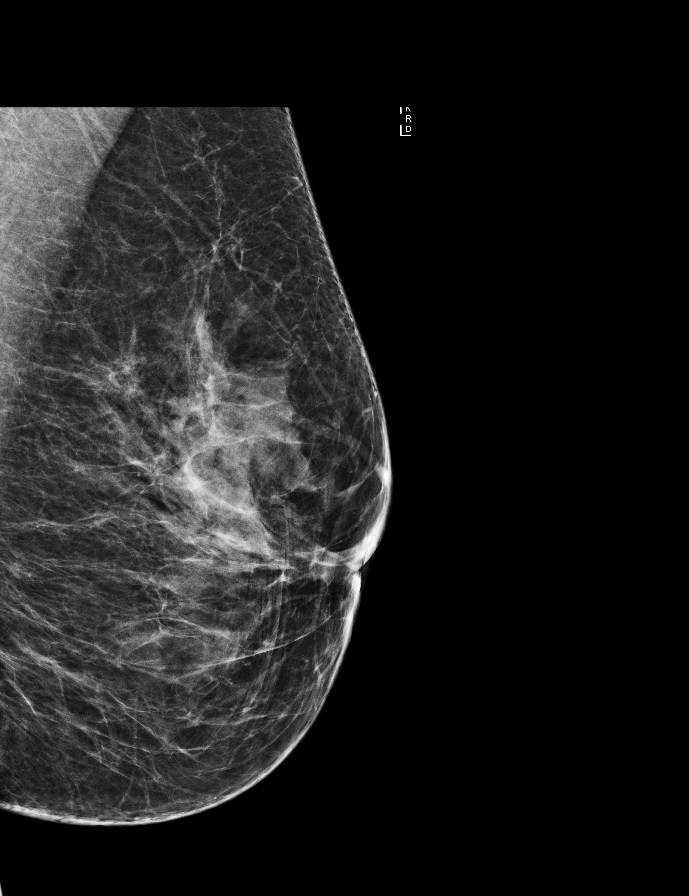

[9 of 29 positions shown; findings below may reference images not displayed]

ACR Breast Density Category c: The breast tissue is heterogeneously
dense, which may obscure small masses.
FINDINGS: Examination demonstrates mild symmetric bilateral nipple inversion
without significant change compared to the prior exam. There is no
focal abnormality within the retroareolar regions. Remainder of the
exam is unchanged.

Mammographic images were processed with CAD.
IMPRESSION: Mild symmetric stable bilateral nipple inversion compatible with a
normal variant. No focal retroareolar abnormality.

RECOMMENDATION:
Recommend continued annual bilateral screening mammographic
followup.

I have discussed the findings and recommendations with the patient.
Results were also provided in writing at the conclusion of the
visit. If applicable, a reminder letter will be sent to the patient
regarding the next appointment.

BI-RADS CATEGORY  1: Negative.

## 2019-01-24 NOTE — Telephone Encounter (Signed)
Patient is calling to check status of paperwork on her refill for her hydrocodone.  States that the pharmacy was supposed to send [place] some kind of paperwork to fill out so that she could get her medication at a cheaper price.  She says she has not heard anything and she will need a new refill on 3/7 and cannot continue to afford paying full price.  Please call her back with an update and if she needs to bring some kind of paperwork to you from the pharmacy, she is happy to do it.  Just asks that we please give her an update.   I see where it looks like some kind of prior auth was initiated on 01/02/19 but am not sure what med it was for or what the outcome was.  Will forward to CMA to investigate further and f/u with patient.

## 2019-01-24 NOTE — Telephone Encounter (Signed)
Spoke to pt. Advised her the PA was approved for 1 month until 02-01-19. Hopefully the rx she has coming up due will go through. She will contact the insurance company and find out what she needs to do to get an extended PA done.

## 2019-01-26 ENCOUNTER — Other Ambulatory Visit: Payer: Self-pay

## 2019-01-26 MED ORDER — HYDROCODONE-ACETAMINOPHEN 5-325 MG PO TABS: 1.0000 | ORAL_TABLET | Freq: Four times a day (QID) | ORAL | 0 refills | Status: DC | PRN

## 2019-01-26 NOTE — Telephone Encounter (Signed)
Name of Medication: Hydrocodone apap 5-325 Name of Pharmacy: [facility] Last Fill or Written Date and Quantity:# 56 on 12/27/18 Last Office Visit and Type: 11/23/18 for 3 mth FU Next Office Visit and Type:03/22/19 CPX  Last Controlled Substance Agreement Date: 01/16/18 Last UDS:11/23/18

## 2019-02-22 ENCOUNTER — Other Ambulatory Visit: Payer: Self-pay | Admitting: Internal Medicine

## 2019-02-23 MED ORDER — HYDROCODONE-ACETAMINOPHEN 5-325 MG PO TABS: 1.0000 | ORAL_TABLET | Freq: Four times a day (QID) | ORAL | 0 refills | Status: DC | PRN

## 2019-02-23 NOTE — Telephone Encounter (Signed)
Name of Medication: Hydrocodone Name of Pharmacy: [facility] or Written Date and Quantity: 01-26-19 #75 Last Office Visit and Type: 3 month f/u 11-23-18 Next Office Visit and Type: 3 month f/u 03-22-19 Last Controlled Substance Agreement Date: 11-23-18 Last UDS: 11-23-18

## 2019-03-15 ENCOUNTER — Encounter: Payer: Self-pay | Admitting: Internal Medicine

## 2019-03-15 ENCOUNTER — Ambulatory Visit (INDEPENDENT_AMBULATORY_CARE_PROVIDER_SITE_OTHER): Payer: BLUE CROSS/BLUE SHIELD | Admitting: Internal Medicine

## 2019-03-15 VITALS — Wt 139.0 lb

## 2019-03-15 DIAGNOSIS — F3341 Major depressive disorder, recurrent, in partial remission: Secondary | ICD-10-CM

## 2019-03-15 DIAGNOSIS — F1120 Opioid dependence, uncomplicated: Secondary | ICD-10-CM

## 2019-03-15 DIAGNOSIS — G8929 Other chronic pain: Secondary | ICD-10-CM

## 2019-03-15 DIAGNOSIS — M549 Dorsalgia, unspecified: Secondary | ICD-10-CM

## 2019-03-15 NOTE — Assessment & Plan Note (Signed)
Doing okay with the narcotic, gabapentin and nortriptyline

## 2019-03-15 NOTE — Assessment & Plan Note (Signed)
PDMP reviewed  No problems 

## 2019-03-15 NOTE — Progress Notes (Signed)
Subjective:    Patient ID: Heather Russo, female    DOB: Dec 19, 1965, 53 y.o.   MRN: [id number]  HPI Virtual visit for review of chronic pain and narcotic dependence Identification done Reviewed billing and consent given She is in her home--I am in my office  Hard getting use to stay in her home Tries to get outside as much as she can--yard work, etc Discussed walking Was working in [employer] and was let go due to COVID--hopes to go back eventually  Chronic pain persists Especially bad if she is on her feet for any extended time  Worried about her mom Is doing her shopping, etc Not really depressed right now Also worries about her daughter --who works in a long term care facility [facility])  Current Outpatient Medications on File Prior to Visit  Medication Sig Dispense Refill  . clonazePAM (KLONOPIN) 0.5 MG tablet Take 1 tablet by mouth 2 (two) times daily as needed.    . fexofenadine (ALLEGRA) 180 MG tablet Take 180 mg by mouth daily as needed.      Marland Kitchen FLUoxetine (PROZAC) 20 MG capsule TAKE 1 CAPSULE(20 MG) BY MOUTH DAILY 90 capsule 3  . gabapentin (NEURONTIN) 100 MG capsule Take 2 capsules by mouth 3 (three) times daily.    Marland Kitchen HYDROcodone-acetaminophen (NORCO/VICODIN) 5-325 MG tablet Take 1 tablet by mouth every 6 (six) hours as needed for moderate pain. 75 tablet 0  . Multiple Vitamin (MULTIVITAMIN) tablet Take 1 tablet by mouth daily.    . nortriptyline (PAMELOR) 10 MG capsule Take 1 capsule (10 mg total) by mouth at bedtime. 90 capsule 3  . pantoprazole (PROTONIX) 40 MG tablet TAKE 1 TABLET(40 MG) BY MOUTH DAILY 90 tablet 3  . traZODone (DESYREL) 50 MG tablet Take 1/2-1 tab nightly 30 tablet 11  . venlafaxine XR (EFFEXOR-XR) 75 MG 24 hr capsule TAKE 3 CAPSULES BY MOUTH ONCE DAILY 90 capsule 11   No current facility-administered medications on file prior to visit.     Allergies  Allergen Reactions  . Bupropion Hcl     REACTION: rash, nausea, bad taste in mouth,  blurred vision  . Lisinopril     REACTION: cough,dizziness  . Mirtazapine     REACTION: unspecified  . Mysoline [Primidone] Hives    Likely cause of hives    Past Medical History:  Diagnosis Date  . Allergic rhinitis   . Allergy   . Anxiety   . Cancer ([place])    skin  . Depression   . Depression   . GERD (gastroesophageal reflux disease)   . HLD (hyperlipidemia)    mild  . HTN (hypertension)   . Migraine   . 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 Syncope and collapse   . Tremor, essential     Past Surgical History:  Procedure Laterality Date  . ESOPHAGOGASTRODUODENOSCOPY  05/05/06  . LAPAROSCOPIC CHOLECYSTECTOMY  03/2008  . wisdom teeth removeal      Family History  Problem Relation Age of Onset  . Hypertension Father   . Heart attack Father   . Diabetes Father   . Hypertension Mother   . Tremor Mother   . Hypertension Brother   . Diabetes Brother   . Hypertension Brother   . Cancer Paternal Grandmother        Lung  . Tremor Brother   . Colon cancer Neg Hx   . Esophageal cancer Neg Hx   . Pancreatic  cancer Neg Hx   . Rectal cancer Neg Hx   . Stomach cancer Neg Hx   . Breast cancer Neg Hx     Social History   Socioeconomic History  . Marital status: Widowed    Spouse name: Not on file  . Number of children: 1  . Years of education: Not on file  . Highest education level: Not on file  Occupational History  . Occupation:  Information systems manager-- laid off 2015    Comment:    . Occupation: Engineer, materials  Social Needs  . Financial resource strain: Not on file  . Food insecurity:    Worry: Not on file    Inability: Not on file  . Transportation needs:    Medical: Not on file    Non-medical: Not on file  Tobacco Use  . Smoking status: Former Smoker    Packs/day: 1.00    Types: Cigarettes    Last attempt to quit: 01/02/2018    Years since quitting: 1.1  . Smokeless tobacco: Never Used  . Tobacco comment:    Substance and  Sexual Activity  . Alcohol use: Yes    Alcohol/week: 0.0 standard drinks    Comment: rare  . Drug use: No  . Sexual activity: Not on file  Lifestyle  . Physical activity:    Days per week: Not on file    Minutes per session: Not on file  . Stress: Not on file  Relationships  . Social connections:    Talks on phone: Not on file    Gets together: Not on file    Attends religious service: Not on file    Active member of club or organization: Not on file    Attends meetings of clubs or organizations: Not on file    Relationship status: Not on file  . Intimate partner violence:    Fear of current or ex partner: Not on file    Emotionally abused: Not on file    Physically abused: Not on file    Forced sexual activity: Not on file  Other Topics Concern  . Not on file  Social History Narrative   Widowed 10/12   Engaged with 2 year relationship--but he died 09-13-23   Review of Systems Sleeps okay Eating is okay    Objective:   Physical Exam  Constitutional: She appears well-developed. No distress.  Respiratory: Effort normal. No respiratory distress.  Psychiatric: She has a normal mood and affect. Her behavior is normal.           Assessment & Plan:

## 2019-03-15 NOTE — Assessment & Plan Note (Signed)
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

## 2019-03-22 ENCOUNTER — Encounter: Payer: BLUE CROSS/BLUE SHIELD | Admitting: Internal Medicine

## 2019-03-23 ENCOUNTER — Other Ambulatory Visit: Payer: Self-pay

## 2019-03-23 NOTE — Telephone Encounter (Signed)
I know at your site they are testing for COVID-19 are their required needed in order to be screened for it?   Thanks

## 2019-03-23 NOTE — Telephone Encounter (Signed)
Last OV 03/15/2019  Last refilled  75 tablet 0 02/23/2019     Next OV 06/15/2019  Sent to PCP for approval

## 2019-03-23 NOTE — Telephone Encounter (Signed)
Last OV  03/15/2019  Last refilled   75 tablet 0 02/23/2019     Next OV 06/15/2019  Sent to PCP for approval

## 2019-03-24 NOTE — Telephone Encounter (Signed)
Name of Medication: Hydrocodone Name of Pharmacy: [facility] Last Fill or Written Date and Quantity: 02-23-19 #75 Last Office Visit and Type: 03-15-19 Next Office Visit and Type: 06-15-19 Last Controlled Substance Agreement Date: 11-23-18 Last UDS: 11-23-18

## 2019-03-24 NOTE — Telephone Encounter (Signed)
Duplicate message. 

## 2019-03-25 MED ORDER — HYDROCODONE-ACETAMINOPHEN 5-325 MG PO TABS: 1.0000 | ORAL_TABLET | Freq: Four times a day (QID) | ORAL | 0 refills | Status: DC | PRN

## 2019-04-24 ENCOUNTER — Other Ambulatory Visit: Payer: Self-pay

## 2019-04-25 MED ORDER — HYDROCODONE-ACETAMINOPHEN 5-325 MG PO TABS: 1.0000 | ORAL_TABLET | Freq: Four times a day (QID) | ORAL | 0 refills | Status: DC | PRN

## 2019-04-25 NOTE — Telephone Encounter (Signed)
Name of Medication: Hydrocodone Name of Pharmacy: [facility] Last Fill or Written Date and Quantity: 03-25-19 #75 Last Office Visit and Type: 3 Month F/U 03-15-19 Next Office Visit and Type: 3 Month F/U 06-15-19 Last Controlled Substance Agreement Date: 11-23-18 Last UDS: 11-23-18

## 2019-05-28 ENCOUNTER — Other Ambulatory Visit: Payer: Self-pay

## 2019-05-29 MED ORDER — HYDROCODONE-ACETAMINOPHEN 5-325 MG PO TABS: 1.0000 | ORAL_TABLET | Freq: Four times a day (QID) | ORAL | 0 refills | Status: DC | PRN

## 2019-05-29 NOTE — Telephone Encounter (Signed)
Name of Medication: Hydrocodone Name of Pharmacy: [facility] Last Fill or Written Date and Quantity: 04-25-19 #75 Last Office Visit and Type: 3 Month F/U 03-15-19 Next Office Visit and Type: 3 Month F/U 06-15-19 Last Controlled Substance Agreement Date: 11-23-18 Last UDS: 11-23-18

## 2019-06-14 ENCOUNTER — Other Ambulatory Visit: Payer: Self-pay | Admitting: Internal Medicine

## 2019-06-14 DIAGNOSIS — I10 Essential (primary) hypertension: Secondary | ICD-10-CM

## 2019-06-15 ENCOUNTER — Encounter: Payer: Self-pay | Admitting: Internal Medicine

## 2019-06-15 ENCOUNTER — Other Ambulatory Visit: Payer: Self-pay

## 2019-06-15 ENCOUNTER — Ambulatory Visit (INDEPENDENT_AMBULATORY_CARE_PROVIDER_SITE_OTHER): Payer: BLUE CROSS/BLUE SHIELD | Admitting: Internal Medicine

## 2019-06-15 ENCOUNTER — Telehealth: Payer: Self-pay | Admitting: Internal Medicine

## 2019-06-15 DIAGNOSIS — F1120 Opioid dependence, uncomplicated: Secondary | ICD-10-CM

## 2019-06-15 DIAGNOSIS — F3341 Major depressive disorder, recurrent, in partial remission: Secondary | ICD-10-CM | POA: Diagnosis not present

## 2019-06-15 DIAGNOSIS — Z0000 Encounter for general adult medical examination without abnormal findings: Secondary | ICD-10-CM

## 2019-06-15 DIAGNOSIS — K2100 Gastro-esophageal reflux disease with esophagitis, without bleeding: Secondary | ICD-10-CM

## 2019-06-15 DIAGNOSIS — I10 Essential (primary) hypertension: Secondary | ICD-10-CM | POA: Diagnosis not present

## 2019-06-15 DIAGNOSIS — M545 Low back pain, unspecified: Secondary | ICD-10-CM

## 2019-06-15 DIAGNOSIS — G8929 Other chronic pain: Secondary | ICD-10-CM

## 2019-06-15 MED ORDER — PANTOPRAZOLE SODIUM 40 MG PO TBEC: 40.0000 mg | DELAYED_RELEASE_TABLET | Freq: Two times a day (BID) | ORAL | 6 refills | Status: DC

## 2019-06-15 NOTE — Assessment & Plan Note (Signed)
Okay when on the protonix--will refill

## 2019-06-15 NOTE — Assessment & Plan Note (Signed)
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

## 2019-06-15 NOTE — Progress Notes (Signed)
Subjective:    Patient ID: Heather Russo, female    DOB: 1966-05-02, 53 y.o.   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)  Back pain is stable Uses the hydrocodone regularly Hard for her at work--stays "bent over" all day---then bad pain upon straightening Hasn't been able to go to gym---discussed home exercise Hopes to lose 10# again--back to 130# Wondering about medication she saw on TV--advised not to  Current Outpatient Medications on File Prior to Visit  Medication Sig Dispense Refill  . clonazePAM (KLONOPIN) 0.5 MG tablet Take 1 tablet by mouth 2 (two) times daily as needed.    . fexofenadine (ALLEGRA) 180 MG tablet Take 180 mg by mouth daily as needed.      Marland Kitchen FLUoxetine (PROZAC) 20 MG capsule TAKE 1 CAPSULE(20 MG) BY MOUTH DAILY 90 capsule 3  . gabapentin (NEURONTIN) 100 MG capsule Take 2 capsules by mouth 3 (three) times daily.    Marland Kitchen HYDROcodone-acetaminophen (NORCO/VICODIN) 5-325 MG tablet Take 1 tablet by mouth every 6 (six) hours as needed for moderate pain. 75 tablet 0  . Multiple Vitamin (MULTIVITAMIN) tablet Take 1 tablet by mouth daily.    . nortriptyline (PAMELOR) 10 MG capsule Take 1 capsule (10 mg total) by mouth at bedtime. 90 capsule 3  . pantoprazole (PROTONIX) 40 MG tablet TAKE 1 TABLET(40 MG) BY MOUTH DAILY 90 tablet 3  . traZODone (DESYREL) 50 MG tablet Take 1/2-1 tab nightly 30 tablet 11   . venlafaxine XR (EFFEXOR-XR) 75 MG 24 hr capsule TAKE 3 CAPSULES BY MOUTH ONCE DAILY 90 capsule 11   No current facility-administered medications on file prior to visit.     Allergies  Allergen Reactions  . Bupropion Hcl     REACTION: rash, nausea, bad taste in mouth, blurred vision  . Lisinopril     REACTION: cough,dizziness  . Mirtazapine     REACTION: unspecified  . Mysoline [Primidone] Hives    Likely cause of hives    Past Medical History:  Diagnosis Date  . Allergic rhinitis   . Allergy   . Anxiety   . Cancer ([place])    skin  . Depression   . Depression   . GERD (gastroesophageal reflux disease)   . HLD (hyperlipidemia)    mild  . HTN (hypertension)   . Migraine   . 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 Syncope and collapse   . Tremor, essential     Past Surgical History:  Procedure Laterality Date  . ESOPHAGOGASTRODUODENOSCOPY  05/05/06  . LAPAROSCOPIC CHOLECYSTECTOMY  03/2008  . wisdom teeth removeal      Family History  Problem Relation Age of Onset  . Hypertension Father   . Heart attack Father   . Diabetes Father   . Hypertension Mother   . Tremor Mother   . Hypertension Brother   . Diabetes Brother   . Hypertension Brother   . Cancer Paternal  Grandmother        Lung  . Tremor Brother   . Colon cancer Neg Hx   . Esophageal cancer Neg Hx   . Pancreatic cancer Neg Hx   . Rectal cancer Neg Hx   . Stomach cancer Neg Hx   . Breast cancer Neg Hx     Social History   Socioeconomic History  . Marital status: Widowed    Spouse name: Not on file  . Number of children: 1  . Years of education: Not on file  . Highest education level: Not on file  Occupational History  . Occupation:  Information systems manager-- laid off 2015    Comment:    . Occupation: Engineer, materials  Social Needs  . Financial resource strain: Not on file  . Food insecurity    Worry: Not on file    Inability: Not on file  . Transportation needs     Medical: Not on file    Non-medical: Not on file  Tobacco Use  . Smoking status: Former Smoker    Packs/day: 1.00    Types: Cigarettes    Quit date: 01/02/2018    Years since quitting: 1.4  . Smokeless tobacco: Never Used  . Tobacco comment:    Substance and Sexual Activity  . Alcohol use: Yes    Alcohol/week: 0.0 standard drinks    Comment: rare  . Drug use: No  . Sexual activity: Not on file  Lifestyle  . Physical activity    Days per week: Not on file    Minutes per session: Not on file  . Stress: Not on file  Relationships  . Social Herbalist on phone: Not on file    Gets together: Not on file    Attends religious service: Not on file    Active member of club or organization: Not on file    Attends meetings of clubs or organizations: Not on file    Relationship status: Not on file  . Intimate partner violence    Fear of current or ex partner: Not on file    Emotionally abused: Not on file    Physically abused: Not on file    Forced sexual activity: Not on file  Other Topics Concern  . Not on file  Social History Narrative   Widowed 10/12   Engaged with 2 year relationship--but he died 2023/09/09   Review of Systems  Constitutional: Negative for fatigue and unexpected weight change.       Taking a daily vitamin now--she thinks it helps Wears seat belt  HENT: Positive for tinnitus. Negative for dental problem, hearing loss and trouble swallowing.   Eyes: Negative for visual disturbance.       No diplopia or unilateral vision loss  Respiratory: Negative for chest tightness and shortness of breath.        Coughs only due to PND from allergies  Cardiovascular: Positive for leg swelling.       Discussed support hose Chest pain/palpitations only with panic attacks  Gastrointestinal: Negative for blood in stool and constipation.       Some heartburn--- can't get the protonix filled  Endocrine: Negative for polydipsia and polyuria.       Is thirsty but not  drinking much  Genitourinary: Negative for dysuria and hematuria.  Musculoskeletal: Positive for back pain. Negative for joint swelling.       Some pain in wrists, fingers and elbows also Hands are stiff for a while  in the morning  Skin: Negative for rash.  Allergic/Immunologic: Positive for environmental allergies. Negative for immunocompromised state.       Allegra/claritin help  Neurological: Positive for dizziness. Negative for syncope and light-headedness.       Headaches are fairly quiet now Tremor only rarely now--with nerves  Hematological: Negative for adenopathy. Does not bruise/bleed easily.  Psychiatric/Behavioral: Positive for dysphoric mood. Negative for sleep disturbance. The patient is nervous/anxious.        Objective:   Physical Exam  Constitutional: She appears well-developed. No distress.  Respiratory: Effort normal. No respiratory distress.  Psychiatric: She has a normal mood and affect. Her behavior is normal.           Assessment & Plan:

## 2019-06-15 NOTE — Patient Instructions (Signed)
How to help anxiety - without medication.   1) Regular Exercise - walking, jogging, cycling, dancing, strength training   2)  Begin a Mindfulness/Meditation practice -- this can take a little as 3 minutes and is helpful for all kinds of mood issues  -- You can find resources in books  -- Or you can download apps like  ---- Headspace App (which currently has free content called "Weathering the Storm")  ---- Calm (which has a few free options)  ---- Insignt Timer  ---- Stop, Breathe & Think   # With each of these Apps - you should decline the "start free trial" offer and as you search through the App should be able to access some of their free content. You can also chose to pay for the content if you find one that works well for you.   # Many of them also offer sleep specific content which may help with insomnia   3) Healthy Diet  -- Avoid or decrease Caffeine  -- Avoid or decrease Alcohol  -- Drink plenty of water, have a balanced diet  -- Avoid cigarettes and marijuana (as well as other recreational drugs)   4) Consider contacting a professional therapist   

## 2019-06-15 NOTE — Assessment & Plan Note (Signed)
No concerns with PDMP

## 2019-06-15 NOTE — Assessment & Plan Note (Signed)
Lots of stress Affected by COVID Back to work so that has helped Will continue current medications

## 2019-06-15 NOTE — Telephone Encounter (Signed)
I left a detailed message on patient's voice mail for patient to call back and schedule 3 month f/u with Dr.Letvak.

## 2019-06-15 NOTE — Assessment & Plan Note (Signed)
Doing okay but ongoing stress Due for Td---will give next time in office Also due for Pap---will try to do it next time Colon due 2027 Still not smoking--congratulations

## 2019-06-15 NOTE — Assessment & Plan Note (Signed)
BP Readings from Last 3 Encounters:  11/23/18 122/78  09/27/18 120/76  09/06/18 110/80   Generally okay Due for labs

## 2019-06-18 ENCOUNTER — Other Ambulatory Visit (INDEPENDENT_AMBULATORY_CARE_PROVIDER_SITE_OTHER): Payer: BLUE CROSS/BLUE SHIELD

## 2019-06-18 DIAGNOSIS — I10 Essential (primary) hypertension: Secondary | ICD-10-CM

## 2019-06-19 LAB — COMPREHENSIVE METABOLIC PANEL
ALT: 16 U/L (ref 0–35)
AST: 20 U/L (ref 0–37)
Albumin: 4 g/dL (ref 3.5–5.2)
Alkaline Phosphatase: 94 U/L (ref 39–117)
BUN: 16 mg/dL (ref 6–23)
CO2: 29 mEq/L (ref 19–32)
Calcium: 8.9 mg/dL (ref 8.4–10.5)
Chloride: 104 mEq/L (ref 96–112)
Creatinine, Ser: 1.01 mg/dL (ref 0.40–1.20)
GFR: 57.18 mL/min — ABNORMAL LOW (ref >60.00)
Glucose, Bld: 103 mg/dL — ABNORMAL HIGH (ref 70–99)
Potassium: 4.1 mEq/L (ref 3.5–5.1)
Sodium: 138 mEq/L (ref 135–145)
Total Bilirubin: 0.3 mg/dL (ref 0.2–1.2)
Total Protein: 6.5 g/dL (ref 6.0–8.3)

## 2019-06-19 LAB — CBC
HCT: 39.8 % (ref 36.0–46.0)
Hemoglobin: 13.2 g/dL (ref 12.0–15.0)
MCHC: 33.2 g/dL (ref 30.0–36.0)
MCV: 91 fl (ref 78.0–100.0)
Platelets: 375 10*3/uL (ref 150.0–400.0)
RBC: 4.38 Mil/uL (ref 3.87–5.11)
RDW: 13.5 % (ref 11.5–15.5)
WBC: 10.8 10*3/uL — ABNORMAL HIGH (ref 4.0–10.5)

## 2019-06-27 ENCOUNTER — Other Ambulatory Visit: Payer: Self-pay

## 2019-06-28 MED ORDER — HYDROCODONE-ACETAMINOPHEN 5-325 MG PO TABS: 1.0000 | ORAL_TABLET | Freq: Four times a day (QID) | ORAL | 0 refills | Status: DC | PRN

## 2019-06-28 NOTE — Telephone Encounter (Signed)
Name of Medication: Hydrocodone Name of Pharmacy: [facility] or Written Date and Quantity: 05-29-19 #75 Last Office Visit and Type: 06-15-19 Next Office Visit and Type: 08-27-19 Last Controlled Substance Agreement Date: 11-23-18 Last UDS: 11-23-18

## 2019-08-01 ENCOUNTER — Other Ambulatory Visit: Payer: Self-pay | Admitting: Internal Medicine

## 2019-08-01 MED ORDER — HYDROCODONE-ACETAMINOPHEN 5-325 MG PO TABS: 1.0000 | ORAL_TABLET | Freq: Four times a day (QID) | ORAL | 0 refills | Status: DC | PRN

## 2019-08-01 NOTE — Telephone Encounter (Signed)
Name of Medication: hydrocodone apap 5-325 mg Name of Gross or Written Date and Quantity: # 41 on 06/28/19 Last Office Visit and Type: 06/15/19 annual Next Office Visit and Type: 08/27/19 for 3 mth FU Last Controlled Substance Agreement Date: 01/16/2018 Last UDS:11/23/18.

## 2019-08-01 NOTE — Telephone Encounter (Signed)
Patient called to get a refill on Vicodin. Patient uses [facility] - [place].

## 2019-08-27 ENCOUNTER — Encounter: Payer: Self-pay | Admitting: Internal Medicine

## 2019-08-27 ENCOUNTER — Ambulatory Visit (INDEPENDENT_AMBULATORY_CARE_PROVIDER_SITE_OTHER): Payer: BLUE CROSS/BLUE SHIELD | Admitting: Internal Medicine

## 2019-08-27 ENCOUNTER — Other Ambulatory Visit (HOSPITAL_COMMUNITY)
Admission: RE | Admit: 2019-08-27 | Payer: BLUE CROSS/BLUE SHIELD | Source: Ambulatory Visit | Admitting: Internal Medicine

## 2019-08-27 ENCOUNTER — Other Ambulatory Visit: Payer: Self-pay

## 2019-08-27 VITALS — BP 106/74 | HR 82 | Temp 98.1°F | Ht 62.0 in | Wt 151.0 lb

## 2019-08-27 DIAGNOSIS — Z23 Encounter for immunization: Secondary | ICD-10-CM

## 2019-08-27 DIAGNOSIS — M545 Low back pain, unspecified: Secondary | ICD-10-CM

## 2019-08-27 DIAGNOSIS — F3341 Major depressive disorder, recurrent, in partial remission: Secondary | ICD-10-CM

## 2019-08-27 DIAGNOSIS — Z01419 Encounter for gynecological examination (general) (routine) without abnormal findings: Secondary | ICD-10-CM

## 2019-08-27 DIAGNOSIS — F1120 Opioid dependence, uncomplicated: Secondary | ICD-10-CM

## 2019-08-27 DIAGNOSIS — G8929 Other chronic pain: Secondary | ICD-10-CM

## 2019-08-27 NOTE — Assessment & Plan Note (Signed)
Ongoing issues Continues on the hydrocodone

## 2019-08-27 NOTE — Progress Notes (Signed)
Subjective:    Patient ID: Heather Russo, female    DOB: [birth year], 53 y.o.   MRN: [id number]  HPI Here for follow up of chronic pain Also needs pap smear  Left foot feels "like it has been asleep and is waking up" Gets some foot swelling at times--but doesn't add salt Tends to be after being on feet all day--but better in AM  Ongoing bad back pain---especially bad in cervical area lately Will lead to headache Takes the hydrocodone 2-3 times a day  Uses the clonazepam from neurologist Helps with nerves--uses 1/2 bid and 1 at bedtime generally Depression controlled "most of the time"  Current Outpatient Medications on File Prior to Visit  Medication Sig Dispense Refill  . clonazePAM (KLONOPIN) 0.5 MG tablet Take 1 tablet by mouth 2 (two) times daily as needed.    . fexofenadine (ALLEGRA) 180 MG tablet Take 180 mg by mouth daily as needed.      Marland Kitchen FLUoxetine (PROZAC) 20 MG capsule TAKE 1 CAPSULE(20 MG) BY MOUTH DAILY 90 capsule 3  . gabapentin (NEURONTIN) 100 MG capsule Take 2 capsules by mouth 3 (three) times daily.    Marland Kitchen HYDROcodone-acetaminophen (NORCO/VICODIN) 5-325 MG tablet Take 1 tablet by mouth every 6 (six) hours as needed for moderate pain. 75 tablet 0  . Multiple Vitamin (MULTIVITAMIN) tablet Take 1 tablet by mouth daily.    . nortriptyline (PAMELOR) 10 MG capsule Take 1 capsule (10 mg total) by mouth at bedtime. 90 capsule 3  . pantoprazole (PROTONIX) 40 MG tablet Take 1 tablet (40 mg total) by mouth 2 (two) times daily. 90 tablet 6  . traZODone (DESYREL) 50 MG tablet Take 1/2-1 tab nightly 30 tablet 11  . venlafaxine XR (EFFEXOR-XR) 75 MG 24 hr capsule TAKE 3 CAPSULES BY MOUTH ONCE DAILY 90 capsule 11   No current facility-administered medications on file prior to visit.     Allergies  Allergen Reactions  . Bupropion Hcl     REACTION: rash, nausea, bad taste in mouth, blurred vision  . Lisinopril     REACTION: cough,dizziness  . Mirtazapine     REACTION:  unspecified  . Mysoline [Primidone] Hives    Likely cause of hives    Past Medical History:  Diagnosis Date  . Allergic rhinitis   . Allergy   . Anxiety   . Cancer ([place])    skin  . Depression   . Depression   . GERD (gastroesophageal reflux disease)   . HLD (hyperlipidemia)    mild  . HTN (hypertension)   . Migraine   . 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 Syncope and collapse   . Tremor, essential     Past Surgical History:  Procedure Laterality Date  . ESOPHAGOGASTRODUODENOSCOPY  05/05/06  . LAPAROSCOPIC CHOLECYSTECTOMY  03/2008  . wisdom teeth removeal      Family History  Problem Relation Age of Onset  . Hypertension Father   . Heart attack Father   . Diabetes Father   . Hypertension Mother   . Tremor Mother   . Hypertension Brother   . Diabetes Brother   . Hypertension Brother   . Cancer Paternal Grandmother        Lung  . Tremor Brother   . Colon cancer Neg Hx   . Esophageal cancer Neg Hx   . Pancreatic cancer Neg Hx   . Rectal cancer Neg Hx   . Stomach cancer Neg Hx   .  Breast cancer Neg Hx     Social History   Socioeconomic History  . Marital status: Widowed    Spouse name: Not on file  . Number of children: 1  . Years of education: Not on file  . Highest education level: Not on file  Occupational History  . Occupation:  Information systems manager-- laid off 2015    Comment:    . Occupation: Engineer, materials  Social Needs  . Financial resource strain: Not on file  . Food insecurity    Worry: Not on file    Inability: Not on file  . Transportation needs    Medical: Not on file    Non-medical: Not on file  Tobacco Use  . Smoking status: Former Smoker    Packs/day: 1.00    Types: Cigarettes    Quit date: 01/02/2018    Years since quitting: 1.6  . Smokeless tobacco: Never Used  . Tobacco comment:    Substance and Sexual Activity  . Alcohol use: Yes    Alcohol/week: 0.0 standard drinks    Comment: rare  . Drug use: No   . Sexual activity: Not on file  Lifestyle  . Physical activity    Days per week: Not on file    Minutes per session: Not on file  . Stress: Not on file  Relationships  . Social Herbalist on phone: Not on file    Gets together: Not on file    Attends religious service: Not on file    Active member of club or organization: Not on file    Attends meetings of clubs or organizations: Not on file    Relationship status: Not on file  . Intimate partner violence    Fear of current or ex partner: Not on file    Emotionally abused: Not on file    Physically abused: Not on file    Forced sexual activity: Not on file  Other Topics Concern  . Not on file  Social History Narrative   Widowed 10/12   Engaged with 2 year relationship--but he died 09/14/2023   Review of Systems Still working at [employer] floor Usually sleeps okay    Objective:   Physical Exam  Constitutional: She appears well-developed. No distress.  Genitourinary:    Genitourinary Comments: Normal introitus Cervix is normal Pap done   Psychiatric: She has a normal mood and affect. Her behavior is normal.           Assessment & Plan:

## 2019-08-27 NOTE — Assessment & Plan Note (Signed)
Ongoing In new relationship--hopefully this will be positive

## 2019-08-27 NOTE — Addendum Note (Signed)
Addended by: Pilar Grammes on: 08/27/2019 05:08 PM   Modules accepted: Orders

## 2019-08-27 NOTE — Assessment & Plan Note (Signed)
PDMP reviewed --no concerns 

## 2019-09-04 LAB — CYTOLOGY - PAP: High risk HPV: POSITIVE — AB

## 2019-09-20 ENCOUNTER — Other Ambulatory Visit: Payer: Self-pay

## 2019-09-20 MED ORDER — HYDROCODONE-ACETAMINOPHEN 5-325 MG PO TABS: 1.0000 | ORAL_TABLET | Freq: Four times a day (QID) | ORAL | 0 refills | Status: DC | PRN

## 2019-09-20 NOTE — Telephone Encounter (Signed)
Name of Medication:  Hydrocodone Name of Pharmacy: [facility] or Written Date and Quantity: 08-01-19 #75 Last Office Visit and Type: 08-27-19 Next Office Visit and Type: 11-26-19 Last Controlled Substance Agreement Date: 11-23-18 Last UDS: 11-23-18

## 2019-09-26 NOTE — Telephone Encounter (Signed)
Pt called stating pharmacy faxed over paperwork yesterday.  And this was returned to them today refused that pt would have to pay cash for rx.  Pt is confused and would like a call back  Pt has been out of meds for 3 days  Best number [phone]

## 2019-09-27 ENCOUNTER — Telehealth: Payer: Self-pay

## 2019-09-27 NOTE — Telephone Encounter (Signed)
Questions completed on CoverMyMeds  Waiting for response back. Can take 24-72 hours.

## 2019-10-20 ENCOUNTER — Other Ambulatory Visit: Payer: Self-pay | Admitting: Internal Medicine

## 2019-11-14 ENCOUNTER — Other Ambulatory Visit: Payer: Self-pay

## 2019-11-14 MED ORDER — HYDROCODONE-ACETAMINOPHEN 5-325 MG PO TABS: 1.0000 | ORAL_TABLET | Freq: Four times a day (QID) | ORAL | 0 refills | Status: DC | PRN

## 2019-11-14 NOTE — Telephone Encounter (Signed)
Patient left message on triage line asking for refill on Vicodin today. Patient states her back is killing her and she needs this refilled today please.  Name of Medication:  Hydrocodone Name of Pharmacy: [facility] or Written Date and Quantity: 09/20/2019 #75 with 0 refill Last Office Visit and Type: 08-27-19 Next Office Visit and Type: 11-26-19 Last Controlled Substance Agreement Date: 11-23-18 Last UDS: 11-23-18

## 2019-11-14 NOTE — Telephone Encounter (Signed)
Patient advised of refill on her voicemail

## 2019-11-26 ENCOUNTER — Other Ambulatory Visit: Payer: Self-pay

## 2019-11-26 ENCOUNTER — Ambulatory Visit (INDEPENDENT_AMBULATORY_CARE_PROVIDER_SITE_OTHER): Payer: BLUE CROSS/BLUE SHIELD | Admitting: Internal Medicine

## 2019-11-26 ENCOUNTER — Encounter: Payer: Self-pay | Admitting: Internal Medicine

## 2019-11-26 DIAGNOSIS — L989 Disorder of the skin and subcutaneous tissue, unspecified: Secondary | ICD-10-CM | POA: Diagnosis not present

## 2019-11-26 DIAGNOSIS — M5440 Lumbago with sciatica, unspecified side: Secondary | ICD-10-CM | POA: Diagnosis not present

## 2019-11-26 DIAGNOSIS — F3341 Major depressive disorder, recurrent, in partial remission: Secondary | ICD-10-CM

## 2019-11-26 DIAGNOSIS — G8929 Other chronic pain: Secondary | ICD-10-CM | POA: Diagnosis not present

## 2019-11-26 MED ORDER — TRIAMCINOLONE ACETONIDE 0.1 % EX CREA: 1.0000 "application " | TOPICAL_CREAM | Freq: Two times a day (BID) | CUTANEOUS | 1 refills | Status: DC | PRN

## 2019-11-26 NOTE — Patient Instructions (Signed)
Please let me know if you need help getting set up with a psychologist

## 2019-11-26 NOTE — Assessment & Plan Note (Signed)
Has well circumscribed near oval lesion on mid dorsum of right hand Mostly under the skin with some scaling ??wart ??seb keratosis  Will try some cortisone cream Consider dermatologist

## 2019-11-26 NOTE — Assessment & Plan Note (Signed)
Ongoing Gets by with hydrocodone and they last more than a month Not clearly radicular now--but still on gabapentin

## 2019-11-26 NOTE — Progress Notes (Signed)
Subjective:    Patient ID: Heather Russo, female    DOB: September 26, 1966, 54 y.o.   MRN: [id number]  HPI Here for follow up of chronic back pain and narcotic dependence  This visit occurred during the SARS-CoV-2 public health emergency.  Safety protocols were in place, including screening questions prior to the visit, additional usage of staff PPE, and extensive cleaning of exam room while observing appropriate contact time as indicated for disinfecting solutions.   Notes bilateral pain higher up--in low lumbar area Pain is not radiating to legs as much Foot are better in the past 3 weeks  Thinks he urine smells Trouble with urgency--some incontinence due to this No dysuria or hematuria  Depression is worse Mom now may have Parkinson's---she is taking her to neurologist. Can't cook and trouble drinking Dad seems to have early dementia Brother has COVID and is sick  Current Outpatient Medications on File Prior to Visit  Medication Sig Dispense Refill  . clonazePAM (KLONOPIN) 0.5 MG tablet Take 1 tablet by mouth 2 (two) times daily as needed.    . fexofenadine (ALLEGRA) 180 MG tablet Take 180 mg by mouth daily as needed.      Marland Kitchen FLUoxetine (PROZAC) 20 MG capsule TAKE 1 CAPSULE(20 MG) BY MOUTH DAILY 90 capsule 3  . gabapentin (NEURONTIN) 100 MG capsule Take 2 capsules by mouth 3 (three) times daily.    Marland Kitchen HYDROcodone-acetaminophen (NORCO/VICODIN) 5-325 MG tablet Take 1 tablet by mouth every 6 (six) hours as needed for moderate pain. 75 tablet 0  . Multiple Vitamin (MULTIVITAMIN) tablet Take 1 tablet by mouth daily.    . pantoprazole (PROTONIX) 40 MG tablet Take 1 tablet (40 mg total) by mouth 2 (two) times daily. 90 tablet 6  . traZODone (DESYREL) 50 MG tablet Take 1/2-1 tab nightly 30 tablet 11  . venlafaxine XR (EFFEXOR-XR) 75 MG 24 hr capsule TAKE 3 CAPSULES BY MOUTH ONCE DAILY 90 capsule 3  . nortriptyline (PAMELOR) 10 MG capsule Take 1 capsule (10 mg total) by mouth at bedtime. 90  capsule 3   No current facility-administered medications on file prior to visit.    Allergies  Allergen Reactions  . Bupropion Hcl     REACTION: rash, nausea, bad taste in mouth, blurred vision  . Lisinopril     REACTION: cough,dizziness  . Mirtazapine     REACTION: unspecified  . Mysoline [Primidone] Hives    Likely cause of hives    Past Medical History:  Diagnosis Date  . Allergic rhinitis   . Allergy   . Anxiety   . Cancer ([place])    skin  . Depression   . Depression   . GERD (gastroesophageal reflux disease)   . HLD (hyperlipidemia)    mild  . HTN (hypertension)   . Migraine   . 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 Syncope and collapse   . Tremor, essential     Past Surgical History:  Procedure Laterality Date  . ESOPHAGOGASTRODUODENOSCOPY  05/05/06  . LAPAROSCOPIC CHOLECYSTECTOMY  03/2008  . wisdom teeth removeal      Family History  Problem Relation Age of Onset  . Hypertension Father   . Heart attack Father   . Diabetes Father   . Hypertension Mother   . Tremor Mother   . Hypertension Brother   . Diabetes Brother   . Hypertension Brother   . Cancer Paternal Grandmother        Lung  .  Tremor Brother   . Colon cancer Neg Hx   . Esophageal cancer Neg Hx   . Pancreatic cancer Neg Hx   . Rectal cancer Neg Hx   . Stomach cancer Neg Hx   . Breast cancer Neg Hx     Social History   Socioeconomic History  . Marital status: Widowed    Spouse name: Not on file  . Number of children: 1  . Years of education: Not on file  . Highest education level: Not on file  Occupational History  . Occupation:  Information systems manager-- laid off 2015    Comment:    . Occupation: Engineer, materials  Tobacco Use  . Smoking status: Former Smoker    Packs/day: 1.00    Types: Cigarettes    Quit date: 01/02/2018    Years since quitting: 1.8  . Smokeless tobacco: Never Used  . Tobacco comment:    Substance and Sexual Activity  . Alcohol use: Yes      Alcohol/week: 0.0 standard drinks    Comment: rare  . Drug use: No  . Sexual activity: Not on file  Other Topics Concern  . Not on file  Social History Narrative   Widowed 10/12   Engaged with 2 year relationship--but he died 10-04-23   Social Determinants of Health   Financial Resource Strain:   . Difficulty of Paying Living Expenses: Not on file  Food Insecurity:   . Worried About Charity fundraiser in the Last Year: Not on file  . Ran Out of Food in the Last Year: Not on file  Transportation Needs:   . Lack of Transportation (Medical): Not on file  . Lack of Transportation (Non-Medical): Not on file  Physical Activity:   . Days of Exercise per Week: Not on file  . Minutes of Exercise per Session: Not on file  Stress:   . Feeling of Stress : Not on file  Social Connections:   . Frequency of Communication with Friends and Family: Not on file  . Frequency of Social Gatherings with Friends and Family: Not on file  . Attends Religious Services: Not on file  . Active Member of Clubs or Organizations: Not on file  . Attends Archivist Meetings: Not on file  . Marital Status: Not on file  Intimate Partner Violence:   . Fear of Current or Ex-Partner: Not on file  . Emotionally Abused: Not on file  . Physically Abused: Not on file  . Sexually Abused: Not on file   Review of Systems Notes some leg swelling--socks indented at night    Objective:   Physical Exam  Constitutional: No distress.  Musculoskeletal:     Comments: No clear CVA tenderness  Psychiatric:  Tearful talking about her family           Assessment & Plan:

## 2019-11-26 NOTE — Assessment & Plan Note (Addendum)
Having a lot of stress Neurologist is trying to get her set up with counselor Some trouble getting out of bed Still working in [employer] --- tough job though Will hold off on more medications for now--but she needs to set up with counselor Discussed crisis number--- no true suicidal ideation

## 2019-11-29 ENCOUNTER — Other Ambulatory Visit: Payer: Self-pay | Admitting: Internal Medicine

## 2019-12-11 ENCOUNTER — Other Ambulatory Visit: Payer: Self-pay | Admitting: Internal Medicine

## 2019-12-11 ENCOUNTER — Other Ambulatory Visit: Payer: Self-pay

## 2019-12-11 NOTE — Telephone Encounter (Signed)
Name of Medication: Hydrocodone-APAP Name of Pharmacy: [facility] or Written Date and Quantity: 11/14/19, #75 Last Office Visit and Type: 11/26/19, 3 mo f/u Next Office Visit and Type: 02/26/20, 3 mo f/u Last Controlled Substance Agreement Date: 08/27/19 Last UDS: 11/23/18  Nortriptyline last rx:  12/11/18, #90

## 2019-12-12 MED ORDER — NORTRIPTYLINE HCL 10 MG PO CAPS: 10.0000 mg | ORAL_CAPSULE | Freq: Every day | ORAL | 3 refills | Status: DC

## 2019-12-12 MED ORDER — HYDROCODONE-ACETAMINOPHEN 5-325 MG PO TABS: 1.0000 | ORAL_TABLET | Freq: Four times a day (QID) | ORAL | 0 refills | Status: DC | PRN

## 2019-12-26 ENCOUNTER — Other Ambulatory Visit: Payer: Self-pay

## 2019-12-27 NOTE — Telephone Encounter (Signed)
Let her know that she is very early with this request. What is going on?

## 2019-12-27 NOTE — Telephone Encounter (Signed)
Too early. Last filled on 12/12/2019

## 2020-01-25 ENCOUNTER — Other Ambulatory Visit: Payer: Self-pay

## 2020-01-28 MED ORDER — HYDROCODONE-ACETAMINOPHEN 5-325 MG PO TABS: 1.0000 | ORAL_TABLET | Freq: Four times a day (QID) | ORAL | 0 refills | Status: DC | PRN

## 2020-01-28 NOTE — Telephone Encounter (Signed)
Name of Medication: Hydrocodone-APAP Name of Pharmacy: [facility] Last Written Date and Quantity: 12/12/19, #75 Last Office Visit and Type: 11/26/19, 3 mo f/u Next Office Visit and Type: 02/26/20, 3 mo f/u Last Controlled Substance Agreement Date: 08/27/19 Last UDS: 11/23/18

## 2020-02-26 ENCOUNTER — Encounter: Payer: Self-pay | Admitting: Internal Medicine

## 2020-02-26 ENCOUNTER — Ambulatory Visit (INDEPENDENT_AMBULATORY_CARE_PROVIDER_SITE_OTHER): Payer: BLUE CROSS/BLUE SHIELD | Admitting: Internal Medicine

## 2020-02-26 ENCOUNTER — Other Ambulatory Visit: Payer: Self-pay

## 2020-02-26 VITALS — BP 118/86 | HR 79 | Temp 98.0°F | Ht 62.0 in | Wt 156.0 lb

## 2020-02-26 DIAGNOSIS — M5489 Other dorsalgia: Secondary | ICD-10-CM

## 2020-02-26 DIAGNOSIS — F1120 Opioid dependence, uncomplicated: Secondary | ICD-10-CM | POA: Diagnosis not present

## 2020-02-26 DIAGNOSIS — G8929 Other chronic pain: Secondary | ICD-10-CM | POA: Diagnosis not present

## 2020-02-26 DIAGNOSIS — M549 Dorsalgia, unspecified: Secondary | ICD-10-CM

## 2020-02-26 DIAGNOSIS — F3341 Major depressive disorder, recurrent, in partial remission: Secondary | ICD-10-CM

## 2020-02-26 MED ORDER — PANTOPRAZOLE SODIUM 40 MG PO TBEC: 40.0000 mg | DELAYED_RELEASE_TABLET | Freq: Two times a day (BID) | ORAL | 11 refills | Status: DC

## 2020-02-26 MED ORDER — VENLAFAXINE HCL ER 75 MG PO CP24: 225.0000 mg | ORAL_CAPSULE | Freq: Every day | ORAL | 11 refills | Status: DC

## 2020-02-26 MED ORDER — HYDROCODONE-ACETAMINOPHEN 5-325 MG PO TABS: 1.0000 | ORAL_TABLET | Freq: Four times a day (QID) | ORAL | 0 refills | Status: DC | PRN

## 2020-02-26 NOTE — Assessment & Plan Note (Signed)
PDMP reviewed No concerns 

## 2020-02-26 NOTE — Assessment & Plan Note (Addendum)
Still having a very hard time Some degree of exacerbation now Will make referral for counseling--not sure if [facility] is in her network though Continue medications--venlafaxine, fluoxetine and trazodone for sleep

## 2020-02-26 NOTE — Progress Notes (Signed)
Subjective:    Patient ID: Heather Russo, female    DOB: 29-Jun-1966, 54 y.o.   MRN: [id number]  HPI Here for follow up of chronic pain and narcotic dependence This visit occurred during the SARS-CoV-2 public health emergency.  Safety protocols were in place, including screening questions prior to the visit, additional usage of staff PPE, and extensive cleaning of exam room while observing appropriate contact time as indicated for disinfecting solutions.   Ongoing back pain--"everything is the same" Trying to keep to just twice a day Right knee is also very painful  Ongoing stress with ailing parents Hard on her--she does try to help them out (goes at least twice a week)  Ongoing depression Every day No suicidal ideation---"that is something I just wouldn't do"  Current Outpatient Medications on File Prior to Visit  Medication Sig Dispense Refill  . clonazePAM (KLONOPIN) 0.5 MG tablet Take 1 tablet by mouth 2 (two) times daily as needed.    . fexofenadine (ALLEGRA) 180 MG tablet Take 180 mg by mouth daily as needed.      Marland Kitchen FLUoxetine (PROZAC) 20 MG capsule TAKE 1 CAPSULE(20 MG) BY MOUTH DAILY 90 capsule 3  . gabapentin (NEURONTIN) 100 MG capsule Take 2 capsules by mouth 3 (three) times daily.    Marland Kitchen HYDROcodone-acetaminophen (NORCO/VICODIN) 5-325 MG tablet Take 1 tablet by mouth every 6 (six) hours as needed for moderate pain. 75 tablet 0  . Multiple Vitamin (MULTIVITAMIN) tablet Take 1 tablet by mouth daily.    . nortriptyline (PAMELOR) 10 MG capsule Take 1 capsule (10 mg total) by mouth at bedtime. 90 capsule 3  . pantoprazole (PROTONIX) 40 MG tablet Take 1 tablet (40 mg total) by mouth 2 (two) times daily. 90 tablet 6  . topiramate (TOPAMAX) 25 MG tablet Take 1 tablet by mouth daily.    . traZODone (DESYREL) 50 MG tablet Take 1/2-1 tab nightly 30 tablet 11  . triamcinolone cream (KENALOG) 0.1 % Apply 1 application topically 2 (two) times daily as needed. 45 g 1  . venlafaxine XR  (EFFEXOR-XR) 75 MG 24 hr capsule TAKE 3 CAPSULES BY MOUTH ONCE DAILY 90 capsule 3   No current facility-administered medications on file prior to visit.    Allergies  Allergen Reactions  . Bupropion Hcl     REACTION: rash, nausea, bad taste in mouth, blurred vision  . Lisinopril     REACTION: cough,dizziness  . Mirtazapine     REACTION: unspecified  . Mysoline [Primidone] Hives    Likely cause of hives    Past Medical History:  Diagnosis Date  . Allergic rhinitis   . Allergy   . Anxiety   . Cancer ([place])    skin  . Depression   . Depression   . GERD (gastroesophageal reflux disease)   . HLD (hyperlipidemia)    mild  . HTN (hypertension)   . Migraine   . 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 Syncope and collapse   . Tremor, essential     Past Surgical History:  Procedure Laterality Date  . ESOPHAGOGASTRODUODENOSCOPY  05/05/06  . LAPAROSCOPIC CHOLECYSTECTOMY  03/2008  . wisdom teeth removeal      Family History  Problem Relation Age of Onset  . Hypertension Father   . Heart attack Father   . Diabetes Father   . Hypertension Mother   . Tremor Mother   . Hypertension Brother   . Diabetes Brother   .  Hypertension Brother   . Cancer Paternal Grandmother        Lung  . Tremor Brother   . Colon cancer Neg Hx   . Esophageal cancer Neg Hx   . Pancreatic cancer Neg Hx   . Rectal cancer Neg Hx   . Stomach cancer Neg Hx   . Breast cancer Neg Hx     Social History   Socioeconomic History  . Marital status: Widowed    Spouse name: Not on file  . Number of children: 1  . Years of education: Not on file  . Highest education level: Not on file  Occupational History  . Occupation:  Information systems manager-- laid off 2015    Comment:    . Occupation: Engineer, materials  Tobacco Use  . Smoking status: Former Smoker    Packs/day: 1.00    Types: Cigarettes    Quit date: 01/02/2018    Years since quitting: 2.1  . Smokeless tobacco: Never Used  .  Tobacco comment:    Substance and Sexual Activity  . Alcohol use: Yes    Alcohol/week: 0.0 standard drinks    Comment: rare  . Drug use: No  . Sexual activity: Not on file  Other Topics Concern  . Not on file  Social History Narrative   Widowed 10/12   Engaged with 2 year relationship--but he died 2023-09-23   Social Determinants of Health   Financial Resource Strain:   . Difficulty of Paying Living Expenses:   Food Insecurity:   . Worried About Charity fundraiser in the Last Year:   . Arboriculturist in the Last Year:   Transportation Needs:   . Film/video editor (Medical):   Marland Kitchen Lack of Transportation (Non-Medical):   Physical Activity:   . Days of Exercise per Week:   . Minutes of Exercise per Session:   Stress:   . Feeling of Stress :   Social Connections:   . Frequency of Communication with Friends and Family:   . Frequency of Social Gatherings with Friends and Family:   . Attends Religious Services:   . Active Member of Clubs or Organizations:   . Attends Archivist Meetings:   Marland Kitchen Marital Status:   Intimate Partner Violence:   . Fear of Current or Ex-Partner:   . Emotionally Abused:   Marland Kitchen Physically Abused:   . Sexually Abused:    Review of Systems  Sleep is variable--depending on how upset she is Appetite is okay--"at least one meal a day"     Objective:   Physical Exam  Psychiatric:  Tearful and depressed Denies SI "the problem is I keep everything to myself"           Assessment & Plan:

## 2020-02-26 NOTE — Assessment & Plan Note (Signed)
Same Pain is affected by her stress and depression

## 2020-02-29 LAB — PAIN MGMT, PROFILE 8 W/CONF, U
6 Acetylmorphine: NEGATIVE ng/mL
Alcohol Metabolites: NEGATIVE ng/mL (ref <500)
Alphahydroxyalprazolam: NEGATIVE ng/mL
Alphahydroxymidazolam: NEGATIVE ng/mL
Alphahydroxytriazolam: NEGATIVE ng/mL
Aminoclonazepam: 856 ng/mL
Amphetamines: NEGATIVE ng/mL
Benzodiazepines: POSITIVE ng/mL
Buprenorphine, Urine: NEGATIVE ng/mL
Cocaine Metabolite: NEGATIVE ng/mL
Codeine: NEGATIVE ng/mL
Creatinine: 171.9 mg/dL
Hydrocodone: 543 ng/mL
Hydromorphone: NEGATIVE ng/mL
Hydroxyethylflurazepam: NEGATIVE ng/mL
Lorazepam: NEGATIVE ng/mL
MDMA: NEGATIVE ng/mL
Marijuana Metabolite: 925 ng/mL
Marijuana Metabolite: POSITIVE ng/mL
Morphine: NEGATIVE ng/mL
Nordiazepam: NEGATIVE ng/mL
Norhydrocodone: 1066 ng/mL
Opiates: POSITIVE ng/mL
Oxazepam: NEGATIVE ng/mL
Oxidant: NEGATIVE ug/mL
Oxycodone: NEGATIVE ng/mL
Temazepam: NEGATIVE ng/mL
pH: 7 (ref 4.5–9.0)

## 2020-03-31 ENCOUNTER — Other Ambulatory Visit: Payer: Self-pay

## 2020-04-01 MED ORDER — HYDROCODONE-ACETAMINOPHEN 5-325 MG PO TABS: 1.0000 | ORAL_TABLET | Freq: Four times a day (QID) | ORAL | 0 refills | Status: DC | PRN

## 2020-04-01 NOTE — Telephone Encounter (Signed)
Name of Medication:Hydrocodone-APAP Name of Pharmacy:[facility] Last Written Date and Quantity:12/12/19, #75 Last Office Visit and Type:02/26/20, 3 mo f/u Next Office Visit and Type:06-25-20, 3 mo f/u Last Controlled Substance Agreement Date:08/27/19 Last UDS:11/23/18

## 2020-04-30 ENCOUNTER — Other Ambulatory Visit: Payer: Self-pay

## 2020-04-30 MED ORDER — HYDROCODONE-ACETAMINOPHEN 5-325 MG PO TABS: 1.0000 | ORAL_TABLET | Freq: Four times a day (QID) | ORAL | 0 refills | Status: DC | PRN

## 2020-04-30 NOTE — Telephone Encounter (Signed)
Name of [place] Name of Pharmacy:[facility] Last Written Date and Quantity:12/12/19, #75 Last Office Visit and Type:02/26/20, 3 mo f/u Next Office Visit and Type:06-25-20, 3 mo f/u Last Controlled Substance Agreement Date:02-26-20 Last UDS:02-26-20

## 2020-06-25 ENCOUNTER — Encounter: Payer: BLUE CROSS/BLUE SHIELD | Admitting: Internal Medicine

## 2020-06-25 DIAGNOSIS — Z0289 Encounter for other administrative examinations: Secondary | ICD-10-CM

## 2020-07-18 ENCOUNTER — Other Ambulatory Visit: Payer: Self-pay | Admitting: *Deleted

## 2020-07-18 ENCOUNTER — Telehealth: Payer: Self-pay

## 2020-07-18 MED ORDER — FLUOXETINE HCL 10 MG PO CAPS: 10.0000 mg | ORAL_CAPSULE | Freq: Every day | ORAL | 3 refills | Status: DC

## 2020-07-18 NOTE — Telephone Encounter (Signed)
Patient returned Anna's call she can be reached at  [phone].

## 2020-07-18 NOTE — Telephone Encounter (Signed)
Pt left VM on Triage calling to check status of phone call, pt request call back ASAP

## 2020-07-18 NOTE — Telephone Encounter (Signed)
Okay to fill the 30mg  daily for a year

## 2020-07-18 NOTE — Telephone Encounter (Signed)
Pt notified as instructed and voiced understanding and would appreciate the fluoxetine 10 mg taking 3 capsules by mouth once daily (pt said Dr Melrose Nakayama changed the fluoxetine rx from what is on pts current med list of fluoxetine 20 mg taking one cap daily). Pt request cb [facility]

## 2020-07-18 NOTE — Telephone Encounter (Signed)
Okay to refill fluoxetine for a year Not sure the others need refills

## 2020-07-18 NOTE — Telephone Encounter (Signed)
Heather Russo - Client TELEPHONE ADVICE RECORD [facility] Patient Name: Heather Russo Gender: Female DOB: Jan 01, 1966 Age: 54 Y 9 M 14 D Return Phone Number: [phone] (Primary) Address: City/State/Zip: [place]  [address] Client [facility] Russo - Client Client Site [place] Physician Heather Russo- MD Contact Type Call Who Is Calling Patient / Member / Family / Caregiver Call Type Triage / Clinical Relationship To Patient Self Return Phone Number [phone] (Primary) Chief Complaint Anxiety and Panic Attack Reason for Call Symptomatic / Request for Health Information Initial Comment [name] states she called the drugstore 3 days ago to get Prozac, Fluoxetine and a third medication filled and the pharmacy is waiting for the dr to call it in. She has been w/o the medication for 3 days. She has anxiety, wants to cry all the time Translation No Nurse Assessment Nurse: Heather Bad, RN, Heather Russo Date/Time Eilene Ghazi Time): 07/17/2020 5:17:26 PM Confirm and document reason for call. If symptomatic, describe symptoms. ---[name] states she called the drugstore 3 days ago to get Prozac, Fluoxetine and a third medication filled and the pharmacy is waiting for the dr to call it in. She has been w/o the medication for 3 days. She has anxiety, wants to cry all the time Has the patient had close contact with a person known or suspected to have the novel coronavirus illness OR traveled / lives in area with major community spread (including international travel) in the last 14 days from the onset of symptoms? * If Asymptomatic, screen for exposure and travel within the last 14 days. ---No Does the patient have any new or worsening symptoms? ---Yes Will a triage be completed? ---Yes Related visit to physician within the last 2 weeks? ---No Does the PT have any chronic conditions? (i.e. diabetes, asthma, this  includes High risk factors for pregnancy, etc.) ---Yes List chronic conditions. ---Anxiety Is the patient pregnant or possibly pregnant? (Ask all females between the ages of 61-55) ---No Is this a behavioral health or substance abuse call? ---No Nurse: Heather Bad, RN, Heather Russo Date/Time (Eastern Time): 07/17/2020 5:18:48 PM Please select the assessment type ---Refill PLEASE NOTE: All timestamps contained within this report are represented as [place] Standard Time. CONFIDENTIALTY NOTICE: This fax transmission is intended only for the addressee. It contains information that is legally privileged, confidential or otherwise protected from use or disclosure. If you are not the intended recipient, you are strictly prohibited from reviewing, disclosing, copying using or disseminating any of this information or taking any action in reliance on or regarding this information. If you have received this fax in error, please notify us immediately by telephone so that we can arrange for its return to [place]. Phone: [phone], Toll-Free: [phone], Fax: [phone] Page: 2 of 2 Call Id: [id number] Nurse Assessment Additional Documentation ---Prozac, Fluoxetine and Venlafaxine are run out and she is waiting for them to be refilled. Pharmacy normally fills them but said they need a refill from office. Does the patient have enough medication to last until the office opens? ---No Additional Documentation ---Same pharmacy on file. Guidelines Guideline Title Affirmed Question Affirmed Notes Nurse Date/Time (Eastern Time) Anxiety and Panic Attack Symptoms interfere with work or school [place], Haematologist 07/17/2020 5:18:46 PM Disp. Time Eilene Ghazi Time) Disposition Final User 07/17/2020 5:24:29 PM See PCP within 24 Hours Yes Heather Bad, RN, [place] Disagree/Comply Comply [name] Understands Yes PreDisposition Call Doctor Care Advice Given Per Guideline * IF OFFICE WILL BE OPEN: You need  to be examined  within the next 24 hours. Call your doctor (or NP/PA) when the office opens and make an appointment. SEE PCP WITHIN 24 HOURS: CARE ADVICE given per Anxiety and Panic Attack (Adult) guideline. * You become worse. * You feel like harming yourself CALL BACK IF: Referrals REFERRED TO PCP OFFICE

## 2020-07-18 NOTE — Telephone Encounter (Signed)
Tried to call pt back but unable to leave message is full.

## 2020-07-18 NOTE — Telephone Encounter (Signed)
I spoke with pt; pt last refill of fluoxetine 20 mg was # 90 x 3 on 11/23/2018. Pt said she has been out of med for only 3 days. Pt said she takes med every day. Pt should have been out of refills in first of 2021. Pt looked at fluoxetine bottle and was written by Chipper Herb in Dr Weber Cooks office. Pt will ck with Dr Weber Cooks office.pt said she was also calling about refill of venlafaxine and pantoprazole. Pt should have refills and pt checked with pharmacy yesterday and was told 2 meds were ready for pick up but pt did not ask which 2 meds were ready. [facility] called [facility] but they do not open until 9 AM. I asked pt to cb to pharmacy after 9 AM and pt began to cry that her father was in hospital with CHF. I asked pt if she needed to be seen and she said she missed last appt and does not have ins. No SI/HI; pt will call [facility] if wants to schedule appt or if urgent need will go to [facility] or ED. Pt was still crying and Merry Proud her boyfriend was there with pt and I asked pts permission to speak with Merry Proud about refills and appts and pt gave verbal permission; Merry Proud will help pt call pharmacy at 9 AM to verify venlafaxine is ready for pick up,and will call for appt when needed. Merry Proud had me speak back with pt and she had calmed herself and was no longer crying; I asked if there was anything else I could do and she said no. Pt gets med at [facility]. FYI to DR Silvio Pate.

## 2020-07-18 NOTE — Telephone Encounter (Signed)
Notified pt--Sent Fluoxetine 10 mg capsule,--take 3 capsule TID with  3 refills  --[facility] [place].

## 2020-08-15 ENCOUNTER — Telehealth: Payer: Self-pay

## 2020-08-15 MED ORDER — HYDROCODONE-ACETAMINOPHEN 5-325 MG PO TABS
1.0000 | ORAL_TABLET | Freq: Four times a day (QID) | ORAL | 0 refills | Status: DC | PRN
Start: 2020-08-15 — End: 2020-11-23

## 2020-08-15 NOTE — Telephone Encounter (Signed)
She needs a follow up in the next month or 2

## 2020-08-15 NOTE — Telephone Encounter (Signed)
Name of Medication: Hydrocodone Name of Pharmacy: [facility] or Written Date and Quantity: 04-30-20 #75 Last Office Visit and Type: 02-26-20 Next Office Visit and Type: No Future Visit Last Controlled Substance Agreement Date: 02-26-20 Last UDS: 02-26-20

## 2020-08-15 NOTE — Telephone Encounter (Signed)
Patient scheduled f/u on 09/23/20.

## 2020-09-23 ENCOUNTER — Ambulatory Visit (INDEPENDENT_AMBULATORY_CARE_PROVIDER_SITE_OTHER): Payer: PRIVATE HEALTH INSURANCE | Admitting: Internal Medicine

## 2020-09-23 ENCOUNTER — Encounter: Payer: Self-pay | Admitting: Internal Medicine

## 2020-09-23 ENCOUNTER — Other Ambulatory Visit: Payer: Self-pay

## 2020-09-23 VITALS — BP 132/84 | HR 77 | Temp 97.6°F | Ht 62.0 in | Wt 161.0 lb

## 2020-09-23 DIAGNOSIS — F3341 Major depressive disorder, recurrent, in partial remission: Secondary | ICD-10-CM | POA: Diagnosis not present

## 2020-09-23 DIAGNOSIS — G8929 Other chronic pain: Secondary | ICD-10-CM

## 2020-09-23 DIAGNOSIS — R8290 Unspecified abnormal findings in urine: Secondary | ICD-10-CM | POA: Diagnosis not present

## 2020-09-23 DIAGNOSIS — M549 Dorsalgia, unspecified: Secondary | ICD-10-CM | POA: Diagnosis not present

## 2020-09-23 DIAGNOSIS — F1120 Opioid dependence, uncomplicated: Secondary | ICD-10-CM | POA: Diagnosis not present

## 2020-09-23 LAB — POC URINALSYSI DIPSTICK (AUTOMATED)
Bilirubin, UA: NEGATIVE
Glucose, UA: NEGATIVE
Ketones, UA: NEGATIVE
Leukocytes, UA: NEGATIVE
Nitrite, UA: NEGATIVE
Protein, UA: NEGATIVE
Spec Grav, UA: 1.02 (ref 1.010–1.025)
Urobilinogen, UA: 0.2 E.U./dL
pH, UA: 7.5 (ref 5.0–8.0)

## 2020-09-23 MED ORDER — CLONAZEPAM 0.5 MG PO TABS
0.5000 mg | ORAL_TABLET | Freq: Two times a day (BID) | ORAL | 0 refills | Status: DC | PRN
Start: 2020-09-23 — End: 2022-03-25

## 2020-09-23 MED ORDER — FLUOXETINE HCL 10 MG PO CAPS
30.0000 mg | ORAL_CAPSULE | Freq: Every day | ORAL | 11 refills | Status: DC
Start: 2020-09-23 — End: 2020-10-13

## 2020-09-23 NOTE — Assessment & Plan Note (Signed)
PDMP reviewed No concerns 

## 2020-09-23 NOTE — Assessment & Plan Note (Signed)
No symptoms of infection Trace blood but no nitrites or leuks Will hold off on Rx

## 2020-09-23 NOTE — Assessment & Plan Note (Addendum)
Clearly exacerbated by running out of fluoxetine Will restart On the venlafaxine as well Will consider aripiprazole if not better in 3-4 weeks  I will refill her clonazepam this time--since being off it is making her mood worse

## 2020-09-23 NOTE — Addendum Note (Signed)
Addended by: Pilar Grammes on: 09/23/2020 12:06 PM   Modules accepted: Orders

## 2020-09-23 NOTE — Progress Notes (Signed)
Subjective:    Patient ID: Heather Russo, female    DOB: 1966-01-22, 54 y.o.   MRN: [id number]  HPI Here for follow up of chronic pain and narcotic dependence This visit occurred during the SARS-CoV-2 public health emergency.  Safety protocols were in place, including screening questions prior to the visit, additional usage of staff PPE, and extensive cleaning of exam room while observing appropriate contact time as indicated for disinfecting solutions.   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 clonazePAM (KLONOPIN) 0.5 MG tablet Take 1 tablet by mouth 2 (two) times daily as needed.    . fexofenadine (ALLEGRA) 180 MG tablet Take 180 mg by mouth daily as needed.      Marland Kitchen FLUoxetine (PROZAC) 10 MG capsule Take 1 capsule (10 mg total) by mouth daily. Take 3 capsule by mouth daily 90 capsule 3  . gabapentin (NEURONTIN) 100 MG capsule Take 2 capsules by mouth 3 (three) times daily.    Marland Kitchen HYDROcodone-acetaminophen (NORCO/VICODIN) 5-325 MG tablet Take 1 tablet by mouth every 6 (six) hours as needed for moderate pain. 75 tablet 0  . Multiple Vitamin (MULTIVITAMIN) tablet Take 1 tablet by mouth daily.    . nortriptyline (PAMELOR) 10 MG capsule Take 1 capsule (10 mg total) by mouth at bedtime. 90 capsule 3  . pantoprazole  (PROTONIX) 40 MG tablet Take 1 tablet (40 mg total) by mouth 2 (two) times daily. 60 tablet 11  . topiramate (TOPAMAX) 25 MG tablet Take 1 tablet by mouth daily.    . traZODone (DESYREL) 50 MG tablet Take 1/2-1 tab nightly 30 tablet 11  . triamcinolone cream (KENALOG) 0.1 % Apply 1 application topically 2 (two) times daily as needed. 45 g 1  . venlafaxine XR (EFFEXOR-XR) 75 MG 24 hr capsule Take 3 capsules (225 mg total) by mouth daily. 90 capsule 11   No current facility-administered medications on file prior to visit.    Allergies  Allergen Reactions  . Bupropion Hcl     REACTION: rash, nausea, bad taste in mouth, blurred vision  . Lisinopril     REACTION: cough,dizziness  . Mirtazapine     REACTION: unspecified  . Mysoline [Primidone] Hives    Likely cause of hives    Past Medical History:  Diagnosis Date  . Allergic rhinitis   . Allergy   . Anxiety   . Cancer ([place])    skin  . Depression   . Depression   . GERD (gastroesophageal reflux disease)   . HLD (hyperlipidemia)    mild  . HTN (hypertension)   . Migraine   . 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 Syncope and collapse   . Tremor, essential     Past Surgical History:  Procedure Laterality Date  .  ESOPHAGOGASTRODUODENOSCOPY  05/05/06  . LAPAROSCOPIC CHOLECYSTECTOMY  03/2008  . wisdom teeth removeal      Family History  Problem Relation Age of Onset  . Hypertension Father   . Heart attack Father   . Diabetes Father   . Hypertension Mother   . Tremor Mother   . Hypertension Brother   . Diabetes Brother   . Hypertension Brother   . Cancer Paternal Grandmother        Lung  . Tremor Brother   . Colon cancer Neg Hx   . Esophageal cancer Neg Hx   . Pancreatic cancer Neg Hx   . Rectal cancer Neg Hx   . Stomach cancer Neg Hx   . Breast cancer Neg Hx     Social History   Socioeconomic History  . Marital status: Widowed    Spouse name: Not on file  . Number of children: 1  .  Years of education: Not on file  . Highest education level: Not on file  Occupational History  . Occupation:  Information systems manager-- laid off 2015    Comment:    . Occupation: Engineer, materials  Tobacco Use  . Smoking status: Former Smoker    Packs/day: 1.00    Types: Cigarettes    Quit date: 01/02/2018    Years since quitting: 2.7  . Smokeless tobacco: Never Used  . Tobacco comment:    Substance and Sexual Activity  . Alcohol use: Yes    Alcohol/week: 0.0 standard drinks    Comment: rare  . Drug use: No  . Sexual activity: Not on file  Other Topics Concern  . Not on file  Social History Narrative   Widowed 10/12   Engaged with 2 year relationship--but he died 09-15-23   Social Determinants of Health   Financial Resource Strain:   . Difficulty of Paying Living Expenses: Not on file  Food Insecurity:   . Worried About Charity fundraiser in the Last Year: Not on file  . Ran Out of Food in the Last Year: Not on file  Transportation Needs:   . Lack of Transportation (Medical): Not on file  . Lack of Transportation (Non-Medical): Not on file  Physical Activity:   . Days of Exercise per Week: Not on file  . Minutes of Exercise per Session: Not on file  Stress:   . Feeling of Stress : Not on file  Social Connections:   . Frequency of Communication with Friends and Family: Not on file  . Frequency of Social Gatherings with Friends and Family: Not on file  . Attends Religious Services: Not on file  . Active Member of Clubs or Organizations: Not on file  . Attends Archivist Meetings: Not on file  . Marital Status: Not on file  Intimate Partner Violence:   . Fear of Current or Ex-Partner: Not on file  . Emotionally Abused: Not on file  . Physically Abused: Not on file  . Sexually Abused: Not on file   Review of Systems Not sleeping well--despite the trazodone "My mind won't shut off" Having trouble focusing Some abnormal urine odor--but no dysuria or hematuria      Objective:   Physical Exam Constitutional:      Appearance: Normal appearance.  Neurological:     Mental Status: She is alert.  Psychiatric:     Comments: Tearful and depressed Insight appears intact Has considered suicide---but no active suicidal ideation due to family obligations  Assessment & Plan:

## 2020-09-23 NOTE — Assessment & Plan Note (Signed)
Had to stop restaurant job Continues on the hydrocodone

## 2020-10-13 ENCOUNTER — Encounter: Payer: Self-pay | Admitting: Internal Medicine

## 2020-10-13 ENCOUNTER — Ambulatory Visit (INDEPENDENT_AMBULATORY_CARE_PROVIDER_SITE_OTHER): Payer: PRIVATE HEALTH INSURANCE | Admitting: Internal Medicine

## 2020-10-13 ENCOUNTER — Other Ambulatory Visit: Payer: Self-pay

## 2020-10-13 VITALS — BP 110/80 | HR 100 | Temp 97.7°F | Ht 62.0 in | Wt 155.0 lb

## 2020-10-13 DIAGNOSIS — F3341 Major depressive disorder, recurrent, in partial remission: Secondary | ICD-10-CM | POA: Diagnosis not present

## 2020-10-13 DIAGNOSIS — J45909 Unspecified asthma, uncomplicated: Secondary | ICD-10-CM | POA: Insufficient documentation

## 2020-10-13 MED ORDER — PREDNISONE 20 MG PO TABS: 40.0000 mg | ORAL_TABLET | Freq: Every day | ORAL | 0 refills | Status: DC

## 2020-10-13 MED ORDER — AZITHROMYCIN 250 MG PO TABS: ORAL_TABLET | ORAL | 0 refills | Status: DC

## 2020-10-13 MED ORDER — FLUOXETINE HCL 40 MG PO CAPS: 40.0000 mg | ORAL_CAPSULE | Freq: Every day | ORAL | 3 refills | Status: DC

## 2020-10-13 NOTE — Assessment & Plan Note (Signed)
Ongoing problems May be too soon to tell with the fluoxetine---- but will increase to 40mg  See back in 1 month

## 2020-10-13 NOTE — Progress Notes (Signed)
Subjective:    Patient ID: Heather Russo, female    DOB: 1966-08-31, 54 y.o.   MRN: [id number]  HPI Here for follow up of depression Also notes recent cough This visit occurred during the SARS-CoV-2 public health emergency.  Safety protocols were in place, including screening questions prior to the visit, additional usage of staff PPE, and extensive cleaning of exam room while observing appropriate contact time as indicated for disinfecting solutions.   Ongoing family stress Taking the fluoxetine daily--but doesn't notice any difference (on the 30mg ) Continues on the venlafaxine  Cough started 2 mornings ago No fever No SOB Has pleuritic left chest pain  No headache, runny nose, congestion No Rx  Current Outpatient Medications on File Prior to Visit  Medication Sig Dispense Refill  . clonazePAM (KLONOPIN) 0.5 MG tablet Take 1 tablet (0.5 mg total) by mouth 2 (two) times daily as needed. 60 tablet 0  . fexofenadine (ALLEGRA) 180 MG tablet Take 180 mg by mouth daily as needed.      Marland Kitchen FLUoxetine (PROZAC) 10 MG capsule Take 3 capsules (30 mg total) by mouth daily. 90 capsule 11  . gabapentin (NEURONTIN) 100 MG capsule Take 2 capsules by mouth 3 (three) times daily.    Marland Kitchen HYDROcodone-acetaminophen (NORCO/VICODIN) 5-325 MG tablet Take 1 tablet by mouth every 6 (six) hours as needed for moderate pain. 75 tablet 0  . Multiple Vitamin (MULTIVITAMIN) tablet Take 1 tablet by mouth daily.    . nortriptyline (PAMELOR) 10 MG capsule Take 1 capsule (10 mg total) by mouth at bedtime. 90 capsule 3  . pantoprazole (PROTONIX) 40 MG tablet Take 1 tablet (40 mg total) by mouth 2 (two) times daily. 60 tablet 11  . topiramate (TOPAMAX) 25 MG tablet Take 1 tablet by mouth daily.    . traZODone (DESYREL) 50 MG tablet Take 1/2-1 tab nightly 30 tablet 11  . triamcinolone cream (KENALOG) 0.1 % Apply 1 application topically 2 (two) times daily as needed. 45 g 1  . venlafaxine XR (EFFEXOR-XR) 75 MG 24 hr capsule  Take 3 capsules (225 mg total) by mouth daily. 90 capsule 11   No current facility-administered medications on file prior to visit.    Allergies  Allergen Reactions  . Bupropion Hcl     REACTION: rash, nausea, bad taste in mouth, blurred vision  . Lisinopril     REACTION: cough,dizziness  . Mirtazapine     REACTION: unspecified  . Mysoline [Primidone] Hives    Likely cause of hives    Past Medical History:  Diagnosis Date  . Allergic rhinitis   . Allergy   . Anxiety   . Cancer ([place])    skin  . Depression   . Depression   . GERD (gastroesophageal reflux disease)   . HLD (hyperlipidemia)    mild  . HTN (hypertension)   . Migraine   . 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 Syncope and collapse   . Tremor, essential     Past Surgical History:  Procedure Laterality Date  . ESOPHAGOGASTRODUODENOSCOPY  05/05/06  . LAPAROSCOPIC CHOLECYSTECTOMY  03/2008  . wisdom teeth removeal      Family History  Problem Relation Age of Onset  . Hypertension Father   . Heart attack Father   . Diabetes Father   . Hypertension Mother   . Tremor Mother   . Hypertension Brother   . Diabetes Brother   . Hypertension Brother   .  Cancer Paternal Grandmother        Lung  . Tremor Brother   . Colon cancer Neg Hx   . Esophageal cancer Neg Hx   . Pancreatic cancer Neg Hx   . Rectal cancer Neg Hx   . Stomach cancer Neg Hx   . Breast cancer Neg Hx     Social History   Socioeconomic History  . Marital status: Widowed    Spouse name: Not on file  . Number of children: 1  . Years of education: Not on file  . Highest education level: Not on file  Occupational History  . Occupation:  Information systems manager-- laid off 2015    Comment:    . Occupation: Engineer, materials  Tobacco Use  . Smoking status: Former Smoker    Packs/day: 1.00    Types: Cigarettes    Quit date: 01/02/2018    Years since quitting: 2.7  . Smokeless tobacco: Never Used  . Tobacco comment:      Substance and Sexual Activity  . Alcohol use: Yes    Alcohol/week: 0.0 standard drinks    Comment: rare  . Drug use: No  . Sexual activity: Not on file  Other Topics Concern  . Not on file  Social History Narrative   Widowed 10/12   Engaged with 2 year relationship--but he died Sep 13, 2023   Social Determinants of Health   Financial Resource Strain:   . Difficulty of Paying Living Expenses: Not on file  Food Insecurity:   . Worried About Charity fundraiser in the Last Year: Not on file  . Ran Out of Food in the Last Year: Not on file  Transportation Needs:   . Lack of Transportation (Medical): Not on file  . Lack of Transportation (Non-Medical): Not on file  Physical Activity:   . Days of Exercise per Week: Not on file  . Minutes of Exercise per Session: Not on file  Stress:   . Feeling of Stress : Not on file  Social Connections:   . Frequency of Communication with Friends and Family: Not on file  . Frequency of Social Gatherings with Friends and Family: Not on file  . Attends Religious Services: Not on file  . Active Member of Clubs or Organizations: Not on file  . Attends Archivist Meetings: Not on file  . Marital Status: Not on file  Intimate Partner Violence:   . Fear of Current or Ex-Partner: Not on file  . Emotionally Abused: Not on file  . Physically Abused: Not on file  . Sexually Abused: Not on file   Review of Systems Not sleeping that well---slightly better back on the fluoxetine Appetite is poor Weight down 6#---but no to previous lows    Objective:   Physical Exam Constitutional:      Comments: Looks tired but no distress  Cardiovascular:     Rate and Rhythm: Normal rate and regular rhythm.     Heart sounds: No murmur heard.  No gallop.   Pulmonary:     Comments: Fair air movement but slight expiratory wheeze and prolonged expiration No crackles Neurological:     Mental Status: She is alert.  Psychiatric:     Comments: Some psychomotor  retardation            Assessment & Plan:

## 2020-10-13 NOTE — Patient Instructions (Signed)
Take the prednisone 2 tabs daily for 5 days, then 1 tab daily for 5 days

## 2020-10-13 NOTE — Assessment & Plan Note (Signed)
No longer smoking Will treat with prednisone for 10 days Empiric z--pak COVID test---would set up infusion if positive

## 2020-10-15 LAB — NOVEL CORONAVIRUS, NAA: SARS-CoV-2, NAA: NOT DETECTED

## 2020-10-15 LAB — SPECIMEN STATUS REPORT

## 2020-10-15 LAB — SARS-COV-2, NAA 2 DAY TAT

## 2020-11-12 ENCOUNTER — Ambulatory Visit: Payer: PRIVATE HEALTH INSURANCE | Admitting: Internal Medicine

## 2020-11-12 DIAGNOSIS — Z0289 Encounter for other administrative examinations: Secondary | ICD-10-CM

## 2020-11-23 ENCOUNTER — Other Ambulatory Visit: Payer: Self-pay

## 2020-11-24 MED ORDER — HYDROCODONE-ACETAMINOPHEN 5-325 MG PO TABS: 1.0000 | ORAL_TABLET | Freq: Four times a day (QID) | ORAL | 0 refills | Status: DC | PRN

## 2020-11-24 NOTE — Telephone Encounter (Signed)
Name of Medication: Hydrocodone Name of Pharmacy: [facility] [place] Last Fill or Written Date and Quantity:  Last Office Visit and Type: 10-13-20 Next Office Visit and Type: 11-12-20 No Show/11-26-20 Last Controlled Substance Agreement Date: 02-26-20 Last UDS: 02-26-20

## 2020-11-24 NOTE — Telephone Encounter (Signed)
Please find out why she was asking for the prednisone again (if she even was)

## 2020-11-26 ENCOUNTER — Encounter: Payer: Self-pay | Admitting: Internal Medicine

## 2020-11-26 ENCOUNTER — Other Ambulatory Visit: Payer: Self-pay

## 2020-11-26 ENCOUNTER — Ambulatory Visit (INDEPENDENT_AMBULATORY_CARE_PROVIDER_SITE_OTHER): Payer: Self-pay | Admitting: Internal Medicine

## 2020-11-26 DIAGNOSIS — F3341 Major depressive disorder, recurrent, in partial remission: Secondary | ICD-10-CM

## 2020-11-26 MED ORDER — ARIPIPRAZOLE 5 MG PO TABS: 5.0000 mg | ORAL_TABLET | Freq: Every day | ORAL | 1 refills | Status: DC

## 2020-11-26 MED ORDER — PANTOPRAZOLE SODIUM 40 MG PO TBEC: 40.0000 mg | DELAYED_RELEASE_TABLET | Freq: Two times a day (BID) | ORAL | 11 refills | Status: DC

## 2020-11-26 NOTE — Assessment & Plan Note (Signed)
Not really better with the increased fluoxetine On venlafaxine also Daily depression and trouble leaving house Will add abilify for augmentation Consider stopping the venlafaxine if does well

## 2020-11-26 NOTE — Progress Notes (Signed)
Subjective:    Patient ID: Heather Russo, female    DOB: June 04, 1966, 55 y.o.   MRN: [id number]  HPI Here for follow up of depression This visit occurred during the SARS-CoV-2 public health emergency.  Safety protocols were in place, including screening questions prior to the visit, additional usage of staff PPE, and extensive cleaning of exam room while observing appropriate contact time as indicated for disinfecting solutions.   May be some better--but not doing great No problems with higher prozac dose Still on venlafaxine also  Still with depression every day Still keeps her from going out and doing things---but goes out to shop and see mom Not working now--not ready to go back with he shakes and fatigue (back pain with any prologed standing)  No thoughts of dying or suicide currently  Current Outpatient Medications on File Prior to Visit  Medication Sig Dispense Refill  . clonazePAM (KLONOPIN) 0.5 MG tablet Take 1 tablet (0.5 mg total) by mouth 2 (two) times daily as needed. 60 tablet 0  . fexofenadine (ALLEGRA) 180 MG tablet Take 180 mg by mouth daily as needed.    Marland Kitchen FLUoxetine (PROZAC) 40 MG capsule Take 1 capsule (40 mg total) by mouth daily. 90 capsule 3  . gabapentin (NEURONTIN) 100 MG capsule Take 2 capsules by mouth 3 (three) times daily.    Marland Kitchen HYDROcodone-acetaminophen (NORCO/VICODIN) 5-325 MG tablet Take 1 tablet by mouth every 6 (six) hours as needed for moderate pain. 75 tablet 0  . Multiple Vitamin (MULTIVITAMIN) tablet Take 1 tablet by mouth daily.    . nortriptyline (PAMELOR) 10 MG capsule Take 1 capsule (10 mg total) by mouth at bedtime. 90 capsule 3  . topiramate (TOPAMAX) 25 MG tablet Take 1 tablet by mouth daily.    . traZODone (DESYREL) 50 MG tablet Take 1/2-1 tab nightly 30 tablet 11  . triamcinolone cream (KENALOG) 0.1 % Apply 1 application topically 2 (two) times daily as needed. 45 g 1  . venlafaxine XR (EFFEXOR-XR) 75 MG 24 hr capsule Take 3 capsules (225 mg  total) by mouth daily. 90 capsule 11   No current facility-administered medications on file prior to visit.    Allergies  Allergen Reactions  . Bupropion Hcl     REACTION: rash, nausea, bad taste in mouth, blurred vision  . Lisinopril     REACTION: cough,dizziness  . Mirtazapine     REACTION: unspecified  . Mysoline [Primidone] Hives    Likely cause of hives    Past Medical History:  Diagnosis Date  . Allergic rhinitis   . Allergy   . Anxiety   . Cancer ([place])    skin  . Depression   . Depression   . GERD (gastroesophageal reflux disease)   . HLD (hyperlipidemia)    mild  . HTN (hypertension)   . Migraine   . 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 Syncope and collapse   . Tremor, essential     Past Surgical History:  Procedure Laterality Date  . ESOPHAGOGASTRODUODENOSCOPY  05/05/06  . LAPAROSCOPIC CHOLECYSTECTOMY  03/2008  . wisdom teeth removeal      Family History  Problem Relation Age of Onset  . Hypertension Father   . Heart attack Father   . Diabetes Father   . Hypertension Mother   . Tremor Mother   . Hypertension Brother   . Diabetes Brother   . Hypertension Brother   . Cancer Paternal Grandmother  Lung  . Tremor Brother   . Colon cancer Neg Hx   . Esophageal cancer Neg Hx   . Pancreatic cancer Neg Hx   . Rectal cancer Neg Hx   . Stomach cancer Neg Hx   . Breast cancer Neg Hx     Social History   Socioeconomic History  . Marital status: Widowed    Spouse name: Not on file  . Number of children: 1  . Years of education: Not on file  . Highest education level: Not on file  Occupational History  . Occupation:  Firefighter-- laid off 2015    Comment:    . Occupation: Therapist, sports  Tobacco Use  . Smoking status: Former Smoker    Packs/day: 1.00    Types: Cigarettes    Quit date: 01/02/2018    Years since quitting: 2.9  . Smokeless tobacco: Never Used  . Tobacco comment:    Substance and Sexual  Activity  . Alcohol use: Yes    Alcohol/week: 0.0 standard drinks    Comment: rare  . Drug use: No  . Sexual activity: Not on file  Other Topics Concern  . Not on file  Social History Narrative   Widowed 10/12   Engaged with 2 year relationship--but he died 23-Sep-2023   Social Determinants of Health   Financial Resource Strain: Not on file  Food Insecurity: Not on file  Transportation Needs: Not on file  Physical Activity: Not on file  Stress: Not on file  Social Connections: Not on file  Intimate Partner Violence: Not on file   Review of Systems Sleeps fair--some trouble maintaining at times Feels tired in day though Eats one meal a day---usually supper (not really hungry till later in the day)    Objective:   Physical Exam Constitutional:      Appearance: Normal appearance.  Neurological:     Mental Status: She is alert.  Psychiatric:     Comments: Depressed Some psychomotor retardation            Assessment & Plan:

## 2020-11-26 NOTE — Telephone Encounter (Signed)
Pharmacy put it on automatic refill.

## 2020-11-26 NOTE — Patient Instructions (Signed)
Please start the aripiprazole at 1/2 tab (2.5mg ) at bedtime. After a week or so, if you are not much better, increase to the full tablet.

## 2020-12-29 DIAGNOSIS — Z8659 Personal history of other mental and behavioral disorders: Secondary | ICD-10-CM | POA: Insufficient documentation

## 2020-12-30 ENCOUNTER — Encounter: Payer: Self-pay | Admitting: Internal Medicine

## 2020-12-30 ENCOUNTER — Other Ambulatory Visit: Payer: Self-pay

## 2020-12-30 ENCOUNTER — Ambulatory Visit (INDEPENDENT_AMBULATORY_CARE_PROVIDER_SITE_OTHER): Payer: 59 | Admitting: Internal Medicine

## 2020-12-30 VITALS — BP 116/84 | HR 85 | Temp 97.3°F | Ht 62.0 in | Wt 153.0 lb

## 2020-12-30 DIAGNOSIS — F3341 Major depressive disorder, recurrent, in partial remission: Secondary | ICD-10-CM | POA: Diagnosis not present

## 2020-12-30 NOTE — Assessment & Plan Note (Addendum)
Ongoing depression No clear response to aripiprazole--but dose increased yesterday by Dr Melrose Nakayama (hasn't started it yet) Continues on venlafaxine and fluoxetine as well Will make referral for counseling as well

## 2020-12-30 NOTE — Progress Notes (Signed)
Subjective:    Patient ID: Heather Russo, female    DOB: 1965-12-09, 55 y.o.   MRN: [id number]  HPI Here for follow up of depression This visit occurred during the SARS-CoV-2 public health emergency.  Safety protocols were in place, including screening questions prior to the visit, additional usage of staff PPE, and extensive cleaning of exam room while observing appropriate contact time as indicated for disinfecting solutions.   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 ARIPiprazole (ABILIFY) 10 MG tablet Take 1 tablet by mouth.    . clonazePAM (KLONOPIN) 0.5 MG tablet Take 1 tablet (0.5 mg total) by mouth 2 (two) times daily as needed. 60 tablet 0  . FLUoxetine (PROZAC) 40 MG capsule Take 1 capsule (40 mg total) by mouth daily. 90 capsule 3  . gabapentin (NEURONTIN) 100 MG capsule Take 2 capsules by mouth 3 (three) times daily.    Marland Kitchen HYDROcodone-acetaminophen (NORCO/VICODIN) 5-325 MG tablet Take 1 tablet by mouth every 6 (six) hours as needed for moderate pain. 75 tablet 0  . Multiple Vitamin (MULTIVITAMIN) tablet Take 1 tablet by mouth daily.    . pantoprazole (PROTONIX) 40 MG tablet Take 1 tablet (40 mg total) by mouth 2 (two) times daily. 60 tablet 11  . topiramate (TOPAMAX) 25 MG tablet Take 1 tablet by mouth daily.    . traZODone (DESYREL) 50 MG tablet Take 1/2-1 tab nightly 30 tablet 11  . triamcinolone cream (KENALOG) 0.1 % Apply 1 application topically 2 (two) times daily as needed. 45 g 1  . venlafaxine XR (EFFEXOR-XR) 75 MG 24 hr capsule Take 3 capsules (225 mg total) by mouth daily. 90 capsule 11  . fexofenadine (ALLEGRA) 180 MG tablet Take 180 mg by mouth daily  as needed. (Patient not taking: Reported on 12/30/2020)    . nortriptyline (PAMELOR) 10 MG capsule Take 1 capsule (10 mg total) by mouth at bedtime. 90 capsule 3   No current facility-administered medications on file prior to visit.    Allergies  Allergen Reactions  . Bupropion Hcl     REACTION: rash, nausea, bad taste in mouth, blurred vision  . Lisinopril     REACTION: cough,dizziness  . Mirtazapine     REACTION: unspecified  . Mysoline [Primidone] Hives    Likely cause of hives    Past Medical History:  Diagnosis Date  . Allergic rhinitis   . Allergy   . Anxiety   . Cancer ([place])    skin  . Depression   . Depression   . GERD (gastroesophageal reflux disease)   . HLD (hyperlipidemia)    mild  . HTN (hypertension)   . Migraine   . 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 Syncope and collapse   . Tremor, essential     Past Surgical History:  Procedure Laterality Date  . ESOPHAGOGASTRODUODENOSCOPY  05/05/06  . LAPAROSCOPIC CHOLECYSTECTOMY  03/2008  . wisdom teeth removeal      Family History  Problem Relation Age of Onset  . Hypertension Father   . Heart attack Father   . Diabetes Father   . Hypertension Mother   . Tremor Mother   .  Hypertension Brother   . Diabetes Brother   . Hypertension Brother   . Cancer Paternal Grandmother        Lung  . Tremor Brother   . Colon cancer Neg Hx   . Esophageal cancer Neg Hx   . Pancreatic cancer Neg Hx   . Rectal cancer Neg Hx   . Stomach cancer Neg Hx   . Breast cancer Neg Hx     Social History   Socioeconomic History  . Marital status: Widowed    Spouse name: Not on file  . Number of children: 1  . Years of education: Not on file  . Highest education level: Not on file  Occupational History  . Occupation:  Information systems manager-- laid off 2015    Comment:    . Occupation: Engineer, materials    Comment: unemployed now  Tobacco Use  . Smoking status: Former Smoker    Packs/day: 1.00     Types: Cigarettes    Quit date: 01/02/2018    Years since quitting: 2.9  . Smokeless tobacco: Never Used  . Tobacco comment:    Substance and Sexual Activity  . Alcohol use: Yes    Alcohol/week: 0.0 standard drinks    Comment: rare  . Drug use: No  . Sexual activity: Not on file  Other Topics Concern  . Not on file  Social History Narrative   Widowed 10/12   Engaged with 2 year relationship--but he died September 15, 2023   Social Determinants of Health   Financial Resource Strain: Not on file  Food Insecurity: Not on file  Transportation Needs: Not on file  Physical Activity: Not on file  Stress: Not on file  Social Connections: Not on file  Intimate Partner Violence: Not on file   Review of Systems Sleeping okay--initiates okay but does have some awakening (but gets back to sleep) Has some thoughts of dying--but hasn't really considered suicide    Objective:   Physical Exam Constitutional:      Appearance: Normal appearance.  Neurological:     Mental Status: She is alert.  Psychiatric:     Comments: Clearly depressed but ?slightly better than last month            Assessment & Plan:

## 2021-02-26 ENCOUNTER — Other Ambulatory Visit: Payer: Self-pay

## 2021-02-26 ENCOUNTER — Other Ambulatory Visit: Payer: Self-pay | Admitting: Internal Medicine

## 2021-02-26 MED ORDER — HYDROCODONE-ACETAMINOPHEN 5-325 MG PO TABS: 1.0000 | ORAL_TABLET | Freq: Four times a day (QID) | ORAL | 0 refills | Status: DC | PRN

## 2021-02-26 NOTE — Telephone Encounter (Signed)
Name of Medication: Hydrocodone Name of Pharmacy: [facility] or Written Date and Quantity:  Last Office Visit and Type: 12/30/20 Next Office Visit and Type: 03-05-21 Last Controlled Substance Agreement Date: 02-26-20 Last UDS: 02-26-20

## 2021-03-04 ENCOUNTER — Telehealth: Payer: Self-pay | Admitting: *Deleted

## 2021-03-04 NOTE — Telephone Encounter (Signed)
Patient left a voicemail stating that she needs a refill on her reflux medication sent to [facility] like it was sent in on 02/26/21 #60/11 Tried to call patient back and the phone number on file is out of service.

## 2021-03-05 ENCOUNTER — Other Ambulatory Visit: Payer: Self-pay

## 2021-03-05 ENCOUNTER — Encounter: Payer: Self-pay | Admitting: Internal Medicine

## 2021-03-05 ENCOUNTER — Ambulatory Visit (INDEPENDENT_AMBULATORY_CARE_PROVIDER_SITE_OTHER): Payer: 59 | Admitting: Internal Medicine

## 2021-03-05 VITALS — BP 102/80 | HR 84 | Temp 97.6°F | Ht 62.0 in | Wt 147.0 lb

## 2021-03-05 DIAGNOSIS — M549 Dorsalgia, unspecified: Secondary | ICD-10-CM

## 2021-03-05 DIAGNOSIS — J4521 Mild intermittent asthma with (acute) exacerbation: Secondary | ICD-10-CM | POA: Diagnosis not present

## 2021-03-05 DIAGNOSIS — F3341 Major depressive disorder, recurrent, in partial remission: Secondary | ICD-10-CM

## 2021-03-05 DIAGNOSIS — F1120 Opioid dependence, uncomplicated: Secondary | ICD-10-CM

## 2021-03-05 DIAGNOSIS — G8929 Other chronic pain: Secondary | ICD-10-CM

## 2021-03-05 MED ORDER — ALBUTEROL SULFATE HFA 108 (90 BASE) MCG/ACT IN AERS: 2.0000 | INHALATION_SPRAY | Freq: Four times a day (QID) | RESPIRATORY_TRACT | 1 refills | Status: DC | PRN

## 2021-03-05 MED ORDER — VENLAFAXINE HCL ER 75 MG PO CP24: 225.0000 mg | ORAL_CAPSULE | Freq: Every day | ORAL | 11 refills | Status: DC

## 2021-03-05 MED ORDER — TOPIRAMATE 25 MG PO TABS: 25.0000 mg | ORAL_TABLET | Freq: Every day | ORAL | 11 refills | Status: DC

## 2021-03-05 NOTE — Assessment & Plan Note (Signed)
PDMP reviewed No concerns 

## 2021-03-05 NOTE — Telephone Encounter (Signed)
I sent her a Therapist, music. Hopefully she gets that.

## 2021-03-05 NOTE — Assessment & Plan Note (Signed)
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

## 2021-03-05 NOTE — Assessment & Plan Note (Signed)
Still depressed despite multiple meds and increased abilfy Will refer for psychiatry

## 2021-03-05 NOTE — Assessment & Plan Note (Signed)
Having trouble again due to spring pollen Gets throat symptoms and some SOB Used friend's albuterol inhaler---it helped. Will fill (use spacer). Can double antihistamines also Avoid montelukast--due to her depression

## 2021-03-05 NOTE — Progress Notes (Signed)
Subjective:    Patient ID: Heather Russo, female    DOB: 1966/01/05, 55 y.o.   MRN: [id number]  HPI Here for follow up of depression and chronic back pain This visit occurred during the SARS-CoV-2 public health emergency.  Safety protocols were in place, including screening questions prior to the visit, additional usage of staff PPE, and extensive cleaning of exam room while observing appropriate contact time as indicated for disinfecting solutions.   Also seeing Dr Melrose Nakayama He has been pushing the dose of the abilify--but she doesn't think it is helping Hasn't gotten a counselor as yet Continues on the fluoxetine and venlafaxine  Continues with the chronic back pain Uses the hydrocodone mostly at night---rarely uses more than one a day  Current Outpatient Medications on File Prior to Visit  Medication Sig Dispense Refill  . clonazePAM (KLONOPIN) 0.5 MG tablet Take 1 tablet (0.5 mg total) by mouth 2 (two) times daily as needed. 60 tablet 0  . fexofenadine (ALLEGRA) 180 MG tablet Take 180 mg by mouth daily as needed.    Marland Kitchen FLUoxetine (PROZAC) 40 MG capsule Take 1 capsule (40 mg total) by mouth daily. 90 capsule 3  . gabapentin (NEURONTIN) 100 MG capsule Take 2 capsules by mouth 3 (three) times daily.    Marland Kitchen HYDROcodone-acetaminophen (NORCO/VICODIN) 5-325 MG tablet Take 1 tablet by mouth every 6 (six) hours as needed for moderate pain. 75 tablet 0  . Multiple Vitamin (MULTIVITAMIN) tablet Take 1 tablet by mouth daily.    . pantoprazole (PROTONIX) 40 MG tablet Take 1 tablet by mouth twice daily 60 tablet 11  . topiramate (TOPAMAX) 25 MG tablet Take 1 tablet by mouth daily.    . traZODone (DESYREL) 50 MG tablet Take 1/2-1 tab nightly 30 tablet 11  . triamcinolone cream (KENALOG) 0.1 % Apply 1 application topically 2 (two) times daily as needed. 45 g 1  . venlafaxine XR (EFFEXOR-XR) 75 MG 24 hr capsule Take 3 capsules (225 mg total) by mouth daily. 90 capsule 11  . ARIPiprazole (ABILIFY) 10 MG  tablet Take 1 tablet by mouth.    . nortriptyline (PAMELOR) 10 MG capsule Take 1 capsule (10 mg total) by mouth at bedtime. 90 capsule 3   No current facility-administered medications on file prior to visit.    Allergies  Allergen Reactions  . Bupropion Hcl     REACTION: rash, nausea, bad taste in mouth, blurred vision  . Lisinopril     REACTION: cough,dizziness  . Mirtazapine     REACTION: unspecified  . Mysoline [Primidone] Hives    Likely cause of hives    Past Medical History:  Diagnosis Date  . Allergic rhinitis   . Allergy   . Anxiety   . Cancer ([place])    skin  . Depression   . Depression   . GERD (gastroesophageal reflux disease)   . HLD (hyperlipidemia)    mild  . HTN (hypertension)   . Migraine   . 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 Syncope and collapse   . Tremor, essential     Past Surgical History:  Procedure Laterality Date  . ESOPHAGOGASTRODUODENOSCOPY  05/05/06  . LAPAROSCOPIC CHOLECYSTECTOMY  03/2008  . wisdom teeth removeal      Family History  Problem Relation Age of Onset  . Hypertension Father   . Heart attack Father   . Diabetes Father   . Hypertension Mother   . Tremor Mother   .  Hypertension Brother   . Diabetes Brother   . Hypertension Brother   . Cancer Paternal Grandmother        Lung  . Tremor Brother   . Colon cancer Neg Hx   . Esophageal cancer Neg Hx   . Pancreatic cancer Neg Hx   . Rectal cancer Neg Hx   . Stomach cancer Neg Hx   . Breast cancer Neg Hx     Social History   Socioeconomic History  . Marital status: Widowed    Spouse name: Not on file  . Number of children: 1  . Years of education: Not on file  . Highest education level: Not on file  Occupational History  . Occupation:  Information systems manager-- laid off 2015    Comment:    . Occupation: Engineer, materials    Comment: unemployed now  Tobacco Use  . Smoking status: Former Smoker    Packs/day: 1.00    Types: Cigarettes    Quit  date: 01/02/2018    Years since quitting: 3.1  . Smokeless tobacco: Never Used  . Tobacco comment:    Substance and Sexual Activity  . Alcohol use: Yes    Alcohol/week: 0.0 standard drinks    Comment: rare  . Drug use: No  . Sexual activity: Not on file  Other Topics Concern  . Not on file  Social History Narrative   Widowed 10/12   Engaged with 2 year relationship--but he died 09-08-2023   Social Determinants of Health   Financial Resource Strain: Not on file  Food Insecurity: Not on file  Transportation Needs: Not on file  Physical Activity: Not on file  Stress: Not on file  Social Connections: Not on file  Intimate Partner Violence: Not on file   Review of Systems  Some trouble sleeping in past few days---some sweats When hot--she has trouble sleeping     Objective:   Physical Exam Constitutional:      Appearance: Normal appearance.  Neurological:     Mental Status: She is alert.  Psychiatric:     Comments: Typical depressed mood Some psychomotor retardation No suicidal ideation            Assessment & Plan:

## 2021-03-08 LAB — DRUG MONITORING, PANEL 8 WITH CONFIRMATION, URINE
6 Acetylmorphine: NEGATIVE ng/mL (ref <10)
Alcohol Metabolites: NEGATIVE ng/mL
Amphetamine: NEGATIVE ng/mL (ref <250)
Amphetamines: NEGATIVE ng/mL (ref <500)
Benzodiazepines: NEGATIVE ng/mL (ref <100)
Buprenorphine, Urine: NEGATIVE ng/mL (ref <5)
Cocaine Metabolite: NEGATIVE ng/mL (ref <150)
Creatinine: 216.3 mg/dL
MDMA: NEGATIVE ng/mL (ref <500)
Marijuana Metabolite: 1244 ng/mL — ABNORMAL HIGH (ref <5)
Marijuana Metabolite: POSITIVE ng/mL — AB (ref <20)
Methamphetamine: NEGATIVE ng/mL (ref <250)
Opiates: NEGATIVE ng/mL (ref <100)
Oxidant: NEGATIVE ug/mL
Oxycodone: NEGATIVE ng/mL (ref <100)
pH: 6.1 (ref 4.5–9.0)

## 2021-03-08 LAB — DM TEMPLATE

## 2021-03-10 NOTE — Telephone Encounter (Signed)
Spoke to pt. Advised her I have not received anything from the pharmacy requesting a PA and there is nothing on CoverMyMeds. I will try to work on it this afternoon.

## 2021-03-10 NOTE — Telephone Encounter (Signed)
Completed on CoverMyMeds. Waiting on response.

## 2021-03-10 NOTE — Telephone Encounter (Signed)
Pt left v/m requesting status of PA for reflux med; pt needs med and request cb ASAP.

## 2021-03-11 NOTE — Telephone Encounter (Signed)
Approved for 1 year to 03-11-22.

## 2021-04-02 ENCOUNTER — Other Ambulatory Visit: Payer: Self-pay

## 2021-04-02 MED ORDER — HYDROCODONE-ACETAMINOPHEN 5-325 MG PO TABS: 1.0000 | ORAL_TABLET | Freq: Four times a day (QID) | ORAL | 0 refills | Status: DC | PRN

## 2021-04-02 NOTE — Telephone Encounter (Signed)
Name of Medication: vicodin 5-325 mg Name of Pharmacy: [facility] Last Fill or Written Date and Quantity: #75 on 02/26/1921 Last Office Visit and Type: 03/05/21 for 3 mth narcotic FU Next Office Visit and Type: 10/01/2021 CPX Last Controlled Substance Agreement Date: 03/05/2020 Last UDS:03/05/2021

## 2021-05-04 ENCOUNTER — Other Ambulatory Visit: Payer: Self-pay

## 2021-05-04 ENCOUNTER — Telehealth: Payer: Self-pay | Admitting: Internal Medicine

## 2021-05-04 MED ORDER — HYDROCODONE-ACETAMINOPHEN 5-325 MG PO TABS: 1.0000 | ORAL_TABLET | Freq: Four times a day (QID) | ORAL | 0 refills | Status: DC | PRN

## 2021-05-04 NOTE — Telephone Encounter (Signed)
New message    Last seen 4.14.2022   1. Which medications need to be refilled? (please list name of each medication and dose if known) HYDROcodone-acetaminophen (NORCO/VICODIN) 5-325 MG tablet  2. Which pharmacy/location (including street and city if local pharmacy) is medication to be sent to?[place], [place], [address]  3. Do they need a 30 day or 90 day supply? 30 days supply

## 2021-05-04 NOTE — Telephone Encounter (Signed)
This is a duplicate request. Message already open for this medication.

## 2021-05-04 NOTE — Telephone Encounter (Signed)
Name of Medication: vicodin 5-325 mg Name of Pharmacy: [facility] Last Fill or Written Date and Quantity: #75 on 04/02/2021 Last Office Visit and Type: 03/05/2021 for 3 mth narcotic FU Next Office Visit and Type: 10/01/2021 CPX Last Controlled Substance Agreement Date: 03/05/2020 Last UDS:03/05/2021  Pt left v/m that she is out of vicodin and request refill done 05/04/21.

## 2021-06-04 ENCOUNTER — Other Ambulatory Visit: Payer: Self-pay | Admitting: *Deleted

## 2021-06-04 MED ORDER — HYDROCODONE-ACETAMINOPHEN 5-325 MG PO TABS: 1.0000 | ORAL_TABLET | Freq: Four times a day (QID) | ORAL | 0 refills | Status: DC | PRN

## 2021-06-04 NOTE — Telephone Encounter (Signed)
Pt left VM on Triage line wanting 2 meds filled   Name of Medication: vicodin 5-325 mg Name of Pharmacy: [facility] Last Fill or Written Date and Quantity: #75 on 05/04/2021 Last Office Visit and Type: 03/05/2021 for 3 mth narcotic FU Next Office Visit and Type: 10/01/2021 CPX Last Controlled Substance Agreement Date: 03/05/2020 Last UDS:03/05/2021  **2nd med she said Dr. Melrose Nakayama fills but she called him 3 days ago and he hasn't filled it pt is very upset because her father is gravely ill and might not make it and her nerves are very bad  and she is asking PCP will fill her Klonopin just once   Please call pt back and let her know  [facility]

## 2021-07-07 ENCOUNTER — Other Ambulatory Visit: Payer: Self-pay | Admitting: *Deleted

## 2021-07-07 MED ORDER — HYDROCODONE-ACETAMINOPHEN 5-325 MG PO TABS: 1.0000 | ORAL_TABLET | Freq: Four times a day (QID) | ORAL | 0 refills | Status: DC | PRN

## 2021-07-07 NOTE — Telephone Encounter (Signed)
Patient called requesting a refill Name of Medication: Hydrocodone Name of Pharmacy: [place] or Written Date and Quantity: 06/04/21 #75 Last Office Visit and Type: 03/05/21 Next Office Visit and Type: 10/01/21 Last Controlled Substance Agreement Date: 03/05/21 Last UDS:03/05/21

## 2021-08-11 ENCOUNTER — Other Ambulatory Visit: Payer: Self-pay | Admitting: Internal Medicine

## 2021-08-11 NOTE — Telephone Encounter (Signed)
Name of Medication: Hydrocodone Name of Pharmacy: [facility] Last Fill or Written Date and Quantity: 07-07-21 #75 Last Office Visit and Type: 03-05-21 Next Office Visit and Type: 10-01-21 Last Controlled Substance Agreement Date: 03-05-21 Last UDS: 03-05-21

## 2021-08-11 NOTE — Telephone Encounter (Signed)
  Encourage patient to contact the pharmacy for refills or they can request refills through [place]:  Please schedule appointment if longer than 1 year  NEXT APPOINTMENT DATE: 10/01/21  MEDICATION: Vicodin  Is the patient out of medication? Yes  PHARMACY: [facility]  Let patient know to contact pharmacy at the end of the day to make sure medication is ready.  Please notify patient to allow 48-72 hours to process  CLINICAL FILLS OUT ALL BELOW:   LAST REFILL:  QTY:  REFILL DATE:    OTHER COMMENTS:    Okay for refill?  Please advise

## 2021-08-12 MED ORDER — HYDROCODONE-ACETAMINOPHEN 5-325 MG PO TABS: 1.0000 | ORAL_TABLET | Freq: Four times a day (QID) | ORAL | 0 refills | Status: DC | PRN

## 2021-09-09 ENCOUNTER — Telehealth: Payer: Self-pay | Admitting: Internal Medicine

## 2021-09-09 NOTE — Telephone Encounter (Signed)
  Encourage patient to contact the pharmacy for refills or they can request refills through [place]:  Please schedule appointment if longer than 1 year  NEXT APPOINTMENT DATE:  MEDICATION:HYDROcodone-acetaminophen (NORCO/VICODIN) 5-325 MG tablet   traZODone (DESYREL) 50 MG tablet  Is the patient out of medication?   [place], [place] [address]    Let patient know to contact pharmacy at the end of the day to make sure medication is ready.  Please notify patient to allow 48-72 hours to process  CLINICAL FILLS OUT ALL BELOW:   LAST REFILL:  QTY:  REFILL DATE:    OTHER COMMENTS:    Okay for refill?  Please advise

## 2021-09-10 MED ORDER — HYDROCODONE-ACETAMINOPHEN 5-325 MG PO TABS: 1.0000 | ORAL_TABLET | Freq: Four times a day (QID) | ORAL | 0 refills | Status: DC | PRN

## 2021-09-10 MED ORDER — TRAZODONE HCL 50 MG PO TABS: ORAL_TABLET | ORAL | 11 refills | Status: DC

## 2021-09-10 NOTE — Telephone Encounter (Signed)
Name of Medication: Hydrocodone Name of [facility]acy: [facility] Last Fill or Written Date and Quantity: 08-12-21 #75 Last Office Visit and Type: 03-05-21 Next Office Visit and Type: 10-01-21 Last Controlled Substance Agreement Date: 03-05-21 Last UDS: 03-05-21  I sent the trazodone

## 2021-09-10 NOTE — Addendum Note (Signed)
Addended by: Viviana Simpler I on: 09/10/2021 01:45 PM   Modules accepted: Orders

## 2021-10-01 ENCOUNTER — Encounter: Payer: Self-pay | Admitting: Internal Medicine

## 2021-10-01 ENCOUNTER — Ambulatory Visit (INDEPENDENT_AMBULATORY_CARE_PROVIDER_SITE_OTHER): Payer: 59 | Admitting: Internal Medicine

## 2021-10-01 ENCOUNTER — Other Ambulatory Visit (HOSPITAL_COMMUNITY)
Admission: RE | Admit: 2021-10-01 | Discharge: 2021-10-01 | Disposition: A | Discharge status: Home / Self Care | Payer: 59 | Source: Ambulatory Visit | Attending: Internal Medicine | Admitting: Internal Medicine

## 2021-10-01 ENCOUNTER — Other Ambulatory Visit: Payer: Self-pay

## 2021-10-01 VITALS — BP 104/70 | HR 70 | Temp 97.2°F | Ht 60.75 in | Wt 147.0 lb

## 2021-10-01 DIAGNOSIS — F1120 Opioid dependence, uncomplicated: Secondary | ICD-10-CM | POA: Diagnosis not present

## 2021-10-01 DIAGNOSIS — M549 Dorsalgia, unspecified: Secondary | ICD-10-CM | POA: Diagnosis not present

## 2021-10-01 DIAGNOSIS — Z0000 Encounter for general adult medical examination without abnormal findings: Secondary | ICD-10-CM | POA: Diagnosis not present

## 2021-10-01 DIAGNOSIS — F3341 Major depressive disorder, recurrent, in partial remission: Secondary | ICD-10-CM

## 2021-10-01 DIAGNOSIS — I10 Essential (primary) hypertension: Secondary | ICD-10-CM | POA: Diagnosis not present

## 2021-10-01 DIAGNOSIS — Z124 Encounter for screening for malignant neoplasm of cervix: Secondary | ICD-10-CM | POA: Diagnosis present

## 2021-10-01 DIAGNOSIS — G8929 Other chronic pain: Secondary | ICD-10-CM

## 2021-10-01 NOTE — Addendum Note (Signed)
Addended by: Loreen Freud on: 10/01/2021 03:51 PM   Modules accepted: Orders

## 2021-10-01 NOTE — Assessment & Plan Note (Signed)
BP Readings from Last 3 Encounters:  10/01/21 104/70  03/05/21 102/80  12/30/20 116/84   Okay without Rx

## 2021-10-01 NOTE — Assessment & Plan Note (Signed)
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

## 2021-10-01 NOTE — Progress Notes (Signed)
Subjective:    Patient ID: Heather Russo, female    DOB: [birth year], 55 y.o.   MRN: [id number]  HPI Here for physical This visit occurred during the SARS-CoV-2 public health emergency.  Safety protocols were in place, including screening questions prior to the visit, additional usage of staff PPE, and extensive cleaning of exam room while observing appropriate contact time as indicated for disinfecting solutions.   "I'm here" Depression continues but no worse or better  Having some urge incontinence Has been doing Kegel's Tries to empty bladder---but can even overflow a pad  Back pain is about the same Takes norco two or three times a day  Current Outpatient Medications on File Prior to Visit  Medication Sig Dispense Refill   albuterol (VENTOLIN HFA) 108 (90 Base) MCG/ACT inhaler Inhale 2 puffs into the lungs every 6 (six) hours as needed for wheezing or shortness of breath. Use with spacer 18 g 1   clonazePAM (KLONOPIN) 0.5 MG tablet Take 1 tablet (0.5 mg total) by mouth 2 (two) times daily as needed. 60 tablet 0   fexofenadine (ALLEGRA) 180 MG tablet Take 180 mg by mouth daily as needed.     FLUoxetine (PROZAC) 40 MG capsule Take 1 capsule (40 mg total) by mouth daily. 90 capsule 3   gabapentin (NEURONTIN) 100 MG capsule Take 2 capsules by mouth 3 (three) times daily.     HYDROcodone-acetaminophen (NORCO/VICODIN) 5-325 MG tablet Take 1 tablet by mouth every 6 (six) hours as needed for moderate pain. 75 tablet 0   pantoprazole (PROTONIX) 40 MG tablet Take 1 tablet by mouth twice daily 60 tablet 11   topiramate (TOPAMAX) 25 MG tablet Take 1 tablet (25 mg total) by mouth daily. 30 tablet 11   traZODone (DESYREL) 50 MG tablet Take 1/2-1 tab nightly 30 tablet 11   triamcinolone cream (KENALOG) 0.1 % Apply 1 application topically 2 (two) times daily as needed. 45 g 1   venlafaxine XR (EFFEXOR-XR) 75 MG 24 hr capsule Take 3 capsules (225 mg total) by mouth daily. 90 capsule 11    ARIPiprazole (ABILIFY) 10 MG tablet Take 1 tablet by mouth.     nortriptyline (PAMELOR) 10 MG capsule Take 1 capsule (10 mg total) by mouth at bedtime. 90 capsule 3   No current facility-administered medications on file prior to visit.    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    Syncope and collapse    Tremor, essential     Past Surgical History:  Procedure Laterality Date   ESOPHAGOGASTRODUODENOSCOPY  05/05/06   LAPAROSCOPIC CHOLECYSTECTOMY  03/2008   wisdom teeth removeal      Family History  Problem Relation Age of Onset   Hypertension Mother    Tremor Mother    Hypertension Father    Heart  attack Father    Diabetes Father    Heart Problems Father    Hypertension Brother    Diabetes Brother    Hypertension Brother    Tremor Brother    Cancer Paternal Grandmother        Lung   Colon cancer Neg Hx    Esophageal cancer Neg Hx    Pancreatic cancer Neg Hx    Rectal cancer Neg Hx    Stomach cancer Neg Hx    Breast cancer Neg Hx     Social History   Socioeconomic History   Marital status: Widowed    Spouse name: Not on file   Number of children: 1   Years of education: Not on file   Highest education level: Not on file  Occupational History   Occupation:  Inventory clerk-- laid off 2015    Comment:     Occupation: Engineer, materials    Comment: unemployed now  Tobacco Use   Smoking status: Former    Packs/day: 1.00    Types: Cigarettes     Quit date: 01/02/2018    Years since quitting: 3.7   Smokeless tobacco: Never   Tobacco comments:       Substance and Sexual Activity   Alcohol use: Yes    Alcohol/week: 0.0 standard drinks    Comment: rare   Drug use: No   Sexual activity: Not on file  Other Topics Concern   Not on file  Social History Narrative   Widowed 10/12   Engaged with 2 year relationship--but he died 09/30/2023   Social Determinants of Health   Financial Resource Strain: Not on file  Food Insecurity: Not on file  Transportation Needs: Not on file  Physical Activity: Not on file  Stress: Not on file  Social Connections: Not on file  Intimate Partner Violence: Not on file   Review of Systems  Constitutional:  Negative for unexpected weight change.       Wears seat belt  HENT:  Positive for dental problem. Negative for hearing loss and tinnitus.        Working with dentist   Eyes:        Some vision changes---overdue for exam  Respiratory:  Positive for cough. Negative for chest tightness and shortness of breath.   Cardiovascular:  Positive for palpitations and leg swelling. Negative for chest pain.  Gastrointestinal:  Negative for blood in stool and constipation.       No heartburn   Endocrine: Positive for polydipsia. Negative for polyuria.  Genitourinary:  Positive for urgency. Negative for dysuria and hematuria.  Musculoskeletal:  Positive for arthralgias and back pain.       Right heel > left heel pain Some knee pain  Skin:  Negative for rash.  Allergic/Immunologic: Positive for environmental allergies. Negative for immunocompromised state.       Uses loratadine prn  Neurological:  Positive for headaches. Negative for dizziness, syncope and light-headedness.  Hematological:  Negative for adenopathy. Bruises/bleeds easily.  Psychiatric/Behavioral:  Positive for dysphoric mood. Negative for sleep disturbance. The patient is nervous/anxious.       Objective:   Physical Exam Constitutional:       Appearance: Normal appearance.  HENT:     Right Ear: Tympanic membrane and ear canal normal.     Left Ear: Tympanic membrane and ear canal normal.     Mouth/Throat:     Pharynx: No oropharyngeal exudate or posterior oropharyngeal erythema.  Eyes:     Conjunctiva/sclera:  Conjunctivae normal.     Pupils: Pupils are equal, round, and reactive to light.  Cardiovascular:     Rate and Rhythm: Normal rate and regular rhythm.     Pulses: Normal pulses.     Heart sounds: No murmur heard.   No gallop.  Pulmonary:     Effort: Pulmonary effort is normal.     Breath sounds: Normal breath sounds.  Abdominal:     Palpations: Abdomen is soft.     Tenderness: There is no abdominal tenderness.  Genitourinary:    Comments: Normal introitus Cervical metaplasia noted Pap done Musculoskeletal:     Cervical back: Neck supple.     Right lower leg: No edema.     Left lower leg: No edema.  Lymphadenopathy:     Cervical: No cervical adenopathy.  Skin:    Findings: No lesion or rash.  Neurological:     Mental Status: She is alert and oriented to person, place, and time.  Psychiatric:        Mood and Affect: Mood normal.        Behavior: Behavior normal.           Assessment & Plan:

## 2021-10-01 NOTE — Assessment & Plan Note (Signed)
Still prefers no COVID, flu or shingrix vaccines----urged her to reconsider COVID and flu Due for mammogram--she will set up Pap today (abnormal 2 years ago) Colon due 2027 Discussed exercising as much as she is able

## 2021-10-01 NOTE — Assessment & Plan Note (Signed)
Functional with the norco 2-3 times a day

## 2021-10-01 NOTE — Patient Instructions (Signed)
Please set up your screening mammogram. 

## 2021-10-01 NOTE — Assessment & Plan Note (Signed)
PDMP reviewed No concerns 

## 2021-10-02 LAB — COMPREHENSIVE METABOLIC PANEL
ALT: 31 U/L (ref 0–35)
AST: 21 U/L (ref 0–37)
Albumin: 4.3 g/dL (ref 3.5–5.2)
Alkaline Phosphatase: 111 U/L (ref 39–117)
BUN: 17 mg/dL (ref 6–23)
CO2: 28 mEq/L (ref 19–32)
Calcium: 9.1 mg/dL (ref 8.4–10.5)
Chloride: 105 mEq/L (ref 96–112)
Creatinine, Ser: 1.05 mg/dL (ref 0.40–1.20)
GFR: 59.61 mL/min — ABNORMAL LOW (ref >60.00)
Glucose, Bld: 84 mg/dL (ref 70–99)
Potassium: 3.9 mEq/L (ref 3.5–5.1)
Sodium: 139 mEq/L (ref 135–145)
Total Bilirubin: 0.4 mg/dL (ref 0.2–1.2)
Total Protein: 6.6 g/dL (ref 6.0–8.3)

## 2021-10-02 LAB — CBC
HCT: 42.8 % (ref 36.0–46.0)
Hemoglobin: 14.2 g/dL (ref 12.0–15.0)
MCHC: 33.2 g/dL (ref 30.0–36.0)
MCV: 90 fl (ref 78.0–100.0)
Platelets: 322 10*3/uL (ref 150.0–400.0)
RBC: 4.76 Mil/uL (ref 3.87–5.11)
RDW: 13.8 % (ref 11.5–15.5)
WBC: 9.8 10*3/uL (ref 4.0–10.5)

## 2021-10-08 ENCOUNTER — Other Ambulatory Visit: Payer: Self-pay | Admitting: Internal Medicine

## 2021-10-08 LAB — CYTOLOGY - PAP
Comment: NEGATIVE
Diagnosis: NEGATIVE
High risk HPV: NEGATIVE

## 2021-10-08 MED ORDER — VENLAFAXINE HCL ER 75 MG PO CP24: 225.0000 mg | ORAL_CAPSULE | Freq: Every day | ORAL | 11 refills | Status: DC

## 2021-10-08 MED ORDER — HYDROCODONE-ACETAMINOPHEN 5-325 MG PO TABS
1.0000 | ORAL_TABLET | Freq: Four times a day (QID) | ORAL | 0 refills | Status: DC | PRN
Start: 2021-10-08 — End: 2021-11-06

## 2021-10-08 NOTE — Telephone Encounter (Signed)
Name of Medication: Hydrocodone Name of Pharmacy: [facility] or Written Date and Quantity: 09-10-21 #75 Last Office Visit and Type: 10-01-21 Next Office Visit and Type: 01-04-22 Last Controlled Substance Agreement Date: 03-05-21 Last UDS: 03-05-21

## 2021-10-08 NOTE — Telephone Encounter (Signed)
  Encourage patient to contact the pharmacy for refills or they can request refills through [place]:  Please schedule appointment if longer than 1 year  NEXT APPOINTMENT DATE:01/04/22  MEDICATION:venlafaxine XR (EFFEXOR-XR) 75 MG 24 hr capsule,HYDROcodone-acetaminophen (NORCO/VICODIN) 5-325 MG tablet  Is the patient out of medication? yes  [place], [place]  Let patient know to contact pharmacy at the end of the day to make sure medication is ready.  Please notify patient to allow 48-72 hours to process  CLINICAL FILLS OUT ALL BELOW:   LAST REFILL:  QTY:  REFILL DATE:    OTHER COMMENTS:    Okay for refill?  Please advise

## 2021-10-22 ENCOUNTER — Telehealth: Payer: Self-pay | Admitting: Internal Medicine

## 2021-10-22 NOTE — Telephone Encounter (Signed)
Tried to call pt. No answer. Labs were sent to MyChart for her review.

## 2021-10-22 NOTE — Telephone Encounter (Signed)
Pt called stated she had lab work done but never got her result . Would like a call back [phone]

## 2021-10-23 NOTE — Telephone Encounter (Signed)
2nd call to pt. No answer. No VM to leave a message.

## 2021-10-26 NOTE — Telephone Encounter (Signed)
Spoke to pt about lab results. She does not have access to MyChart right now.

## 2021-11-06 ENCOUNTER — Other Ambulatory Visit: Payer: Self-pay | Admitting: Internal Medicine

## 2021-11-06 MED ORDER — HYDROCODONE-ACETAMINOPHEN 5-325 MG PO TABS: 1.0000 | ORAL_TABLET | Freq: Four times a day (QID) | ORAL | 0 refills | Status: DC | PRN

## 2021-11-06 NOTE — Telephone Encounter (Signed)
Name of Medication: Hydrocodone Name of Pharmacy: [facility] or Written Date and Quantity: 10-08-21 #75 Last Office Visit and Type: 10-01-21 Next Office Visit and Type: 01-04-22 Last Controlled Substance Agreement Date: 03-05-21 Last UDS: 03-05-21

## 2021-11-06 NOTE — Telephone Encounter (Signed)
°  Encourage patient to contact the pharmacy for refills or they can request refills through [facility]:  Please schedule appointment if longer than 1 year  NEXT APPOINTMENT DATE:01/04/22  MEDICATION:HYDROcodone-acetaminophen (NORCO/VICODIN) 5-325 MG tablet  Is the patient out of medication? yes  [place], [place]  Let patient know to contact pharmacy at the end of the day to make sure medication is ready.  Please notify patient to allow 48-72 hours to process  CLINICAL FILLS OUT ALL BELOW:   LAST REFILL:  QTY:  REFILL DATE:    OTHER COMMENTS:    Okay for refill?  Please advise

## 2021-12-04 ENCOUNTER — Other Ambulatory Visit: Payer: Self-pay | Admitting: Internal Medicine

## 2021-12-04 MED ORDER — HYDROCODONE-ACETAMINOPHEN 5-325 MG PO TABS: 1.0000 | ORAL_TABLET | Freq: Four times a day (QID) | ORAL | 0 refills | Status: DC | PRN

## 2021-12-04 NOTE — Addendum Note (Signed)
Addended by: Pilar Grammes on: 12/04/2021 02:58 PM   Modules accepted: Orders

## 2021-12-04 NOTE — Telephone Encounter (Signed)
°  Encourage patient to contact the pharmacy for refills or they can request refills through [facility]:  Please schedule appointment if longer than 1 year  NEXT APPOINTMENT DATE: 01/04/22  MEDICATION: HYDROcodone-acetaminophen (NORCO/VICODIN) 5-325 MG tablet  Is the patient out of medication? YES  [facility]  Let patient know to contact pharmacy at the end of the day to make sure medication is ready.  Please notify patient to allow 48-72 hours to process  CLINICAL FILLS OUT ALL BELOW:   LAST REFILL:  QTY:  REFILL DATE:    OTHER COMMENTS:    Okay for refill?  Please advise

## 2021-12-04 NOTE — Addendum Note (Signed)
Addended by: Viviana Simpler I on: 12/04/2021 04:45 PM   Modules accepted: Orders

## 2021-12-04 NOTE — Telephone Encounter (Signed)
Name of Medication: Hydrocodone Name of Pharmacy: [facility] or Written Date and Quantity: 10-08-21 #75 Last Office Visit and Type: 11-06-21 Next Office Visit and Type: 01-04-22 Last Controlled Substance Agreement Date: 03-05-21 Last UDS: 03-05-21

## 2021-12-31 ENCOUNTER — Telehealth: Payer: Self-pay

## 2021-12-31 MED ORDER — HYDROCODONE-ACETAMINOPHEN 5-325 MG PO TABS: 1.0000 | ORAL_TABLET | Freq: Four times a day (QID) | ORAL | 0 refills | Status: DC | PRN

## 2021-12-31 NOTE — Telephone Encounter (Signed)
I spoke with  pt; pt does not have transportation and rt ear hurts worse when swallows.pt does not want to go to [facility] or an appt. Pt requesting hydrocodone apap5-325mg  refill to [facility]. Pt has one day of med left but only thing that helps pain. Last refilled # 75 on 12/04/21.  Last Office Visit and Type: 10/01/21 Next Office Visit and Type:01/04/22  Last Controlled Substance Agreement Date: 05/05/21 Last UDS:03/05/21  Sending note to DR Silvio Pate and shannon CMA and will teams shannon.

## 2021-12-31 NOTE — Telephone Encounter (Signed)
Rx refilled If ongoing pain, will need to be seen somewhere

## 2021-12-31 NOTE — Telephone Encounter (Signed)
[facility] - Client TELEPHONE ADVICE RECORD [facility] Patient Name: Heather Russo Gender: Female DOB: 1965/11/27 Age: 56 Y 2 M 28 D Return Phone Number: [phone] (Primary) Address: City/ State/ Zip: [place]  [id number] Client [facility] - Client Client Site [facility] - Day Provider Viviana Simpler- MD Contact Type Call Who Is Calling Patient / Member / Family / Caregiver Call Type Triage / Clinical Relationship To Patient Self Return Phone Number [phone] (Primary) Chief Complaint Earache Reason for Call Symptomatic / Request for [place] states she is having ear pain, Additional Comment xfer from office for triage due to no appts today Translation No Nurse Assessment Nurse: Gildardo Pounds, RN, Amy Date/Time (Eastern Time): 12/31/2021 10:24:18 AM Confirm and document reason for call. If symptomatic, describe symptoms. ---[name] states she is having right ear pain. It started last night. She rates it as a 10/10 when it shoots in her ear. No fever or other symptoms. Does the patient have any new or worsening symptoms? ---Yes Will a triage be completed? ---Yes Related visit to physician within the last 2 weeks? ---No Does the PT have any chronic conditions? (i.e. diabetes, asthma, this includes High risk factors for pregnancy, etc.) ---Yes List chronic conditions. ---back pain, depression Is this a behavioral health or substance abuse call? ---No Guidelines Guideline Title Affirmed Question Affirmed Notes Nurse Date/Time (Eastern Time) Earache [1] SEVERE pain AND [2] not improved 2 hours after taking analgesic medication (e.g., ibuprofen or acetaminophen) Lovelace, RN, Amy 12/31/2021 10:26:28 AM Disp. Time Eilene Ghazi Time) Disposition Final User PLEASE NOTE: All timestamps contained within this report are represented as [place] Standard  Time. CONFIDENTIALTY NOTICE: This fax transmission is intended only for the addressee. It contains information that is legally privileged, confidential or otherwise protected from use or disclosure. If you are not the intended recipient, you are strictly prohibited from reviewing, disclosing, copying using or disseminating any of this information or taking any action in reliance on or regarding this information. If you have received this fax in error, please notify us immediately by telephone so that we can arrange for its return to [place]. Phone: [phone], Toll-Free: [phone], Fax: [phone] Page: 2 of 2 Call Id: [id number] 12/31/2021 10:28:14 AM See HCP within 4 Hours (or PCP triage) Yes Lovelace, RN, Amy [name] Disagree/Comply Disagree [name] Understands Yes PreDisposition InappropriateToAsk Care Advice Given Per Guideline SEE HCP (OR PCP TRIAGE) WITHIN 4 HOURS: * IF OFFICE WILL BE OPEN: You need to be seen within the next 3 or 4 hours. Call your doctor (or NP/PA) now or as soon as the office opens. PAIN MEDICINES: * For pain relief, you can take either acetaminophen, ibuprofen, or naproxen. CALL BACK IF: * You become worse CARE ADVICE given per Earache (Adult) guideline. Referrals GO TO FACILITY REFUSE

## 2021-12-31 NOTE — Telephone Encounter (Signed)
Spoke to pt. Will go somewhere tomorrow if no improvement.

## 2022-01-04 ENCOUNTER — Ambulatory Visit (INDEPENDENT_AMBULATORY_CARE_PROVIDER_SITE_OTHER): Payer: 59 | Admitting: Internal Medicine

## 2022-01-04 ENCOUNTER — Other Ambulatory Visit: Payer: Self-pay

## 2022-01-04 ENCOUNTER — Encounter: Payer: Self-pay | Admitting: Internal Medicine

## 2022-01-04 DIAGNOSIS — M549 Dorsalgia, unspecified: Secondary | ICD-10-CM | POA: Diagnosis not present

## 2022-01-04 DIAGNOSIS — G8929 Other chronic pain: Secondary | ICD-10-CM | POA: Diagnosis not present

## 2022-01-04 DIAGNOSIS — F3341 Major depressive disorder, recurrent, in partial remission: Secondary | ICD-10-CM

## 2022-01-04 DIAGNOSIS — F1120 Opioid dependence, uncomplicated: Secondary | ICD-10-CM | POA: Diagnosis not present

## 2022-01-04 NOTE — Assessment & Plan Note (Signed)
Uses the norco up to three times a day---but mostly less

## 2022-01-04 NOTE — Assessment & Plan Note (Signed)
PDMP reviewed No concerns 

## 2022-01-04 NOTE — Progress Notes (Signed)
Subjective:    Patient ID: Heather Russo, female    DOB: [birth year], 56 y.o.   MRN: [id number]  HPI Here for follow up of chronic back pain and narcotic dependence  "Same old, same old" Daily depression still Nerves are bad---scratching arms Has clonazepam---taking just twice a day though  Now taking care of mom Not looking for work now Doesn't generally go out much Not involved with church Hasn't gone back to gym since pandemic  Ongoing back pain Uses the norco 2-3 times a day at most Still just using prn --so not on a routine  Current Outpatient Medications on File Prior to Visit  Medication Sig Dispense Refill   albuterol (VENTOLIN HFA) 108 (90 Base) MCG/ACT inhaler Inhale 2 puffs into the lungs every 6 (six) hours as needed for wheezing or shortness of breath. Use with spacer 18 g 1   clonazePAM (KLONOPIN) 0.5 MG tablet Take 1 tablet (0.5 mg total) by mouth 2 (two) times daily as needed. 60 tablet 0   fexofenadine (ALLEGRA) 180 MG tablet Take 180 mg by mouth daily as needed.     FLUoxetine (PROZAC) 40 MG capsule Take 1 capsule by mouth once daily 90 capsule 3   gabapentin (NEURONTIN) 100 MG capsule Take 2 capsules by mouth 3 (three) times daily.     HYDROcodone-acetaminophen (NORCO/VICODIN) 5-325 MG tablet Take 1 tablet by mouth every 6 (six) hours as needed for moderate pain. 75 tablet 0   pantoprazole (PROTONIX) 40 MG tablet Take 1 tablet by mouth twice daily 60 tablet 11   topiramate (TOPAMAX) 25 MG tablet Take 1 tablet (25 mg total) by mouth daily. 30 tablet 11   traZODone (DESYREL) 50 MG tablet Take 1/2-1 tab nightly 30 tablet 11   triamcinolone cream (KENALOG) 0.1 % Apply 1 application topically 2 (two) times daily as needed. 45 g 1   venlafaxine XR (EFFEXOR-XR) 75 MG 24 hr capsule Take 3 capsules (225 mg total) by mouth daily. 90 capsule 11   nortriptyline (PAMELOR) 10 MG capsule Take 1 capsule (10 mg total) by mouth at bedtime. 90 capsule 3   No current  facility-administered medications on file prior to visit.    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    Syncope and collapse    Tremor, essential     Past Surgical History:  Procedure Laterality Date   ESOPHAGOGASTRODUODENOSCOPY  05/05/06   LAPAROSCOPIC CHOLECYSTECTOMY  03/2008   wisdom teeth removeal      Family History  Problem Relation Age of Onset   Hypertension Mother    Tremor Mother    Hypertension Father    Heart attack Father    Diabetes Father    Heart Problems Father    Hypertension Brother    Diabetes Brother    Hypertension  Brother    Tremor Brother    Cancer Paternal Grandmother        Lung   Colon cancer Neg Hx    Esophageal cancer Neg Hx    Pancreatic cancer Neg Hx    Rectal cancer Neg Hx    Stomach cancer Neg Hx    Breast cancer Neg Hx     Social History   Socioeconomic History   Marital status: Widowed    Spouse name: Not on file   Number of children: 1   Years of education: Not on file   Highest education level: Not on file  Occupational History   Occupation:  Inventory clerk-- laid off 2015    Comment:     Occupation: Engineer, materials    Comment: unemployed now  Tobacco Use   Smoking status: Former    Packs/day: 1.00    Types: Cigarettes    Quit date: 01/02/2018    Years since quitting: 4.0   Smokeless tobacco: Never   Tobacco comments:       Substance and Sexual Activity   Alcohol use: Yes    Alcohol/week: 0.0  standard drinks    Comment: rare   Drug use: No   Sexual activity: Not on file  Other Topics Concern   Not on file  Social History Narrative   Widowed 10/12   Engaged with 2 year relationship--but he died 2023-09-23   Social Determinants of Health   Financial Resource Strain: Not on file  Food Insecurity: Not on file  Transportation Needs: Not on file  Physical Activity: Not on file  Stress: Not on file  Social Connections: Not on file  Intimate Partner Violence: Not on file   Review of Systems Sleeping okay Appetite is not great---eats once a day (night) Older brother lives with them also     Objective:   Physical Exam Constitutional:      Appearance: Normal appearance.  Neurological:     Mental Status: She is alert.  Psychiatric:     Comments: Mild psychomotor retardation/depression           Assessment & Plan:

## 2022-01-04 NOTE — Assessment & Plan Note (Signed)
Discussed adding exercise to her regular routine Fluoxetine 40mg   Venlafaxine 225 daily Trazodone 50 for sleep  Needs social outlets

## 2022-01-28 ENCOUNTER — Other Ambulatory Visit: Payer: Self-pay | Admitting: Internal Medicine

## 2022-01-28 NOTE — Telephone Encounter (Signed)
Name of Medication: Hydrocodone ?Name of Pharmacy: [facility] ?Last Fill or Written Date and Quantity: 12-31-21 #75 ?Last Office Visit and Type: 01-04-22 ?Next Office Visit and Type: 04-06-22 ?Last Controlled Substance Agreement Date: 03-05-21 ?Last UDS: 03-05-21  ?  ? ?

## 2022-01-28 NOTE — Telephone Encounter (Signed)
?  Encourage patient to contact the pharmacy for refills or they can request refills through [facility] ? ?Did the patient contact the pharmacy:  N ? ? ?LAST APPOINTMENT DATE:  2.13.23 ? ?NEXT APPOINTMENT DATE:  5.16.23 ? ?MEDICATION: HYDROcodone-acetaminophen (NORCO/VICODIN) 5-325 MG tablet ? ?Is the patient out of medication?  Y ? ?If not, how much is left? Enough to get her through today ? ?Is this a 90 day supply: N ? ?PHARMACY:  ?[place], [place] - [place], [place] [address] Phone:  [phone]  ?Fax:  [phone]  ?  ? ? ?Let patient know to contact pharmacy at the end of the day to make sure medication is ready. ? ?Please notify patient to allow 48-72 hours to process ?  ?

## 2022-01-29 MED ORDER — HYDROCODONE-ACETAMINOPHEN 5-325 MG PO TABS: 1.0000 | ORAL_TABLET | Freq: Four times a day (QID) | ORAL | 0 refills | Status: DC | PRN

## 2022-01-29 NOTE — Telephone Encounter (Signed)
Patient called to follow up on med refill let her know it was sent in to pharmacy today. She will call if any questions.  ?

## 2022-03-01 ENCOUNTER — Other Ambulatory Visit: Payer: Self-pay

## 2022-03-01 MED ORDER — HYDROCODONE-ACETAMINOPHEN 5-325 MG PO TABS: 1.0000 | ORAL_TABLET | Freq: Four times a day (QID) | ORAL | 0 refills | Status: DC | PRN

## 2022-03-01 NOTE — Telephone Encounter (Signed)
[facility] Night - Client ?TELEPHONE ADVICE RECORD ?[facility]? ?Patient ?Name: Heather Russo ?Gender: Female ?DOB: 02-Jun-1966 ?Age: 56 Y 4 M 26 D ?Return ?Phone ?Number: ?[id number] ?(Primary) ?Address: ?City/ ?State/ ?Zip: ?[place] ? [address] ?Client [place] Night - Client ?Client Site [facility] [place] ?Provider Viviana Simpler- MD ?Contact Type Call ?Who Is Calling Patient / Member / Family / Caregiver ?Call Type Triage / Clinical ?Relationship To Patient Self ?Return Phone Number [phone] (Primary) ?Chief Complaint Unclassified Symptom ?Reason for Call Symptomatic / Request for Health Information ?Initial Comment [name] states she is needing her vikatin refilled. She ?is experiencing a hoarse voice. ?Translation No ?Nurse Assessment ?Nurse: Raenette Rover, RN, Zella Ball Date/Time Eilene Ghazi Time): 02/26/2022 12:52:10 PM ?Confirm and document reason for call. If ?symptomatic, describe symptoms. ?---[name] states she is needing her Vicodin refilled. ?Takes it for her chronic back. She is experiencing a ?hoarse voice denies any resp symptoms. ?Does the patient have any new or worsening ?symptoms? ---Yes ?Will a triage be completed? ---Yes ?Related visit to physician within the last 2 weeks? ---No ?Does the PT have any chronic conditions? (i.e. ?diabetes, asthma, this includes High risk factors for ?pregnancy, etc.) ?---Yes ?List chronic conditions. ---chronic back pain, htn ?Is this a behavioral health or substance abuse call? ---No ?Guidelines ?Guideline Title Affirmed Question Affirmed Notes Nurse Date/Time (Eastern ?Time) ?Chest Pain [1] Chest pain lasts > ?5 minutes AND [2] ?age > 55 ?Raenette Rover, RN, Zella Ball 02/26/2022 12:55:22 ?PM ?Disp. Time (Eastern ?Time) Disposition Final User ?02/26/2022 12:47:41 PM Send To RN Personal Raenette Rover, RN, Zella Ball ?02/26/2022 12:59:57 PM 911 Outcome Documentation Raenette Rover, RN, Zella Ball ?Reason: Refused. ?PLEASE NOTE: All timestamps  contained within this report are represented as [place] Standard Time. ?CONFIDENTIALTY NOTICE: This fax transmission is intended only for the addressee. It contains information that is legally privileged, confidential or ?otherwise protected from use or disclosure. If you are not the intended recipient, you are strictly prohibited from reviewing, disclosing, copying using ?or disseminating any of this information or taking any action in reliance on or regarding this information. If you have received this fax in error, please ?notify us immediately by telephone so that we can arrange for its return to [place]. Phone: [phone], Toll-Free: [phone], Fax: [phone] ?Page: 2 of 2 ?Call Id: [id number] ?02/26/2022 12:57:22 PM Call EMS 911 Now Yes Raenette Rover, RN, Zella Ball ?[name] Disagree/Comply Disagree ?[name] Understands Yes ?PreDisposition InappropriateToAsk ?Care Advice Given Per Guideline ?CALL EMS 911 NOW: CARE ADVICE given per Chest Pain (Adult) guideline. ?Referrals ?GO TO FACILITY REFUSE ?

## 2022-03-01 NOTE — Telephone Encounter (Signed)
I spoke with pt and for 1 wk pt has had a hoarse voice, pt has ulcer at back of throat; pt has dry cough but no CP, no SOB, no wheezing, no vomiting or diarrhea, no fever or chills or body aches. Offered pt an appt to be seen on 03/01/22 with a different [place] provider but pt declined and only wants to see Dr Silvio Pate.pt scheduled appt with Dr Silvio Pate on 03/03/22 at 8:45, UC & ED precautions given and pt voiced understanding. Sending note to DR Silvio Pate who is out of office today but will be back in office on 03/02/22 and [facility] CMA. Pt thinks she may have covid test at home and pt will ck a covid test. ? ?Name of Medication: hydrocodone apap 5-325 mg ?Name of Pharmacy: [facility] ?Last Fill or Written Date and Quantity: # 75 on 01/29/22 ?Last Office Visit and Type: 01/04/22 back pain ?Next Office Visit and Type: 04/06/22 for 3 mth FU ?Last Controlled Substance Agreement Date: 05/05/2021 ?Last UDS:03/05/2021 ? ? ?

## 2022-03-03 ENCOUNTER — Encounter: Payer: Self-pay | Admitting: Internal Medicine

## 2022-03-03 ENCOUNTER — Ambulatory Visit (INDEPENDENT_AMBULATORY_CARE_PROVIDER_SITE_OTHER): Payer: 59 | Admitting: Internal Medicine

## 2022-03-03 VITALS — BP 108/76 | HR 76 | Temp 98.3°F | Ht 60.75 in | Wt 146.7 lb

## 2022-03-03 DIAGNOSIS — R490 Dysphonia: Secondary | ICD-10-CM | POA: Diagnosis not present

## 2022-03-03 NOTE — Progress Notes (Signed)
? ?Subjective:  ? ? Patient ID: Heather Russo, female    DOB: Nov 09, 1966, 56 y.o.   MRN: [id number] ? ?HPI ?Here due to hoarseness ? ?Has been hoarse---now into second week ?Not sick ?No allergy symptoms ?No heartburn --- but does have some dysphagia (at her throat) since the hoarseness started ?Continues on the pantoprazole ?No choking spells ?No trauma ? ?Current Outpatient Medications on File Prior to Visit  ?Medication Sig Dispense Refill  ? albuterol (VENTOLIN HFA) 108 (90 Base) MCG/ACT inhaler Inhale 2 puffs into the lungs every 6 (six) hours as needed for wheezing or shortness of breath. Use with spacer 18 g 1  ? clonazePAM (KLONOPIN) 0.5 MG tablet Take 1 tablet (0.5 mg total) by mouth 2 (two) times daily as needed. 60 tablet 0  ? fexofenadine (ALLEGRA) 180 MG tablet Take 180 mg by mouth daily as needed.    ? FLUoxetine (PROZAC) 40 MG capsule Take 1 capsule by mouth once daily 90 capsule 3  ? gabapentin (NEURONTIN) 100 MG capsule Take 2 capsules by mouth 3 (three) times daily.    ? HYDROcodone-acetaminophen (NORCO/VICODIN) 5-325 MG tablet Take 1 tablet by mouth every 6 (six) hours as needed for moderate pain. 75 tablet 0  ? pantoprazole (PROTONIX) 40 MG tablet Take 1 tablet by mouth twice daily 60 tablet 11  ? traZODone (DESYREL) 50 MG tablet Take 1/2-1 tab nightly 30 tablet 11  ? triamcinolone cream (KENALOG) 0.1 % Apply 1 application topically 2 (two) times daily as needed. 45 g 1  ? venlafaxine XR (EFFEXOR-XR) 75 MG 24 hr capsule Take 3 capsules (225 mg total) by mouth daily. 90 capsule 11  ? nortriptyline (PAMELOR) 10 MG capsule Take 1 capsule (10 mg total) by mouth at bedtime. 90 capsule 3  ? topiramate (TOPAMAX) 25 MG tablet Take 1 tablet (25 mg total) by mouth daily. (Patient not taking: Reported on 03/03/2022) 30 tablet 11  ? ?No current facility-administered medications on file prior to visit.  ? ? ?Allergies  ?Allergen Reactions  ? Bupropion Hcl   ?  REACTION: rash, nausea, bad taste in mouth, blurred  vision  ? Lisinopril   ?  REACTION: cough,dizziness  ? Mirtazapine   ?  REACTION: unspecified  ? Mysoline [Primidone] Hives  ?  Likely cause of hives  ? ? ?Past Medical History:  ?Diagnosis Date  ? Allergic rhinitis   ? Allergy   ? Anxiety   ? Cancer [facility])   ? skin  ? Depression   ? Depression   ? GERD (gastroesophageal reflux disease)   ? HLD (hyperlipidemia)   ? mild  ? HTN (hypertension)   ? Migraine   ? 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 Syncope and collapse   ? Tremor, essential   ? ? ?Past Surgical History:  ?Procedure Laterality Date  ? ESOPHAGOGASTRODUODENOSCOPY  05/05/06  ? LAPAROSCOPIC CHOLECYSTECTOMY  03/2008  ? wisdom teeth removeal    ? ? ?Family History  ?Problem Relation Age of Onset  ? Hypertension Mother   ? Tremor Mother   ? Hypertension Father   ? Heart attack Father   ? Diabetes Father   ? Heart Problems Father   ? Hypertension Brother   ? Diabetes Brother   ? Hypertension Brother   ? Tremor Brother   ? Cancer Paternal Grandmother   ?     Lung  ? Colon cancer Neg Hx   ? Esophageal cancer Neg Hx   ?  Pancreatic cancer Neg Hx   ? Rectal cancer Neg Hx   ? Stomach cancer Neg Hx   ? Breast cancer Neg Hx   ? ? ?Social History  ? ?Socioeconomic History  ? Marital status: Widowed  ?  Spouse name: Not on file  ? Number of children: 1  ? Years of education: Not on file  ? Highest education level: Not on file  ?Occupational History  ? Occupation:  Information systems manager-- laid off 2015  ?  Comment:    ? Occupation: Engineer, materials  ?  Comment: unemployed now  ?Tobacco Use  ? Smoking status: Former  ?  Packs/day: 1.00  ?  Types: Cigarettes  ?  Quit date: 01/02/2018  ?  Years since quitting: 4.1  ? Smokeless tobacco: Never  ? Tobacco comments:  ?     ?Substance and Sexual Activity  ? Alcohol use: Yes  ?  Alcohol/week: 0.0 standard drinks  ?  Comment: rare  ? Drug use: No  ? Sexual activity: Not on file  ?Other Topics Concern  ? Not on file  ?Social History Narrative  ? Widowed 10/12  ?  Engaged with 2 year relationship--but he died Sep 25, 2023  ? ?Social Determinants of Health  ? ?Financial Resource Strain: Not on file  ?Food Insecurity: Not on file  ?Transportation Needs: Not on file  ?Physical Activity: Not on file  ?Stress: Not on file  ?Social Connections: Not on file  ?Intimate Partner Violence: Not on file  ? ?Review of Systems ?No tobacco products since 2019 ? ?   ?Objective:  ? Physical Exam ?Constitutional:   ?   Appearance: Normal appearance.  ?   Comments: Fairly noticeable hoarseness  ?HENT:  ?   Mouth/Throat:  ?   Pharynx: No oropharyngeal exudate or posterior oropharyngeal erythema.  ?   Comments: No abnormal lesions ?Neck:  ?   Comments: No masses ?Musculoskeletal:  ?   Cervical back: Neck supple.  ?Lymphadenopathy:  ?   Cervical: No cervical adenopathy.  ?Neurological:  ?   Mental Status: She is alert.  ?  ? ? ? ? ?   ?Assessment & Plan:  ? ?

## 2022-03-03 NOTE — Assessment & Plan Note (Signed)
Fairly striking ?No trauma, infection, acid symptoms or drainage ?Will set up with ENT for direct laryngoscopy ?

## 2022-03-05 ENCOUNTER — Telehealth: Payer: Self-pay | Admitting: Internal Medicine

## 2022-03-05 NOTE — Telephone Encounter (Signed)
Patient called about her referral for an ear and throat doctor, is asking when she would get a call back. Her last apt was on 4.12.2023. ?

## 2022-03-05 NOTE — Telephone Encounter (Signed)
Called and spoke with pt regarding referral to I let the pt know that referral has been sent ent and usually takes about one week for them to response ,I advised he if she didn't hear anything back in by middle next week to give [place] a call back. ?

## 2022-03-12 ENCOUNTER — Telehealth: Payer: Self-pay

## 2022-03-12 NOTE — Telephone Encounter (Signed)
Referral to ENT was created 03-03-22. Pt has not heard anything. Please advise her about referral status. Thanks ?

## 2022-03-16 ENCOUNTER — Encounter: Payer: Self-pay | Admitting: *Deleted

## 2022-03-16 NOTE — Telephone Encounter (Signed)
Referral was sent today and Mychart message also sent to the patient making her aware.  ? ?Referral was not marked as Urgent so I am just getting to it today.  ? ?Referral Notes ?Marland Kitchen ?Type Date User  ?General 03/16/2022 11:49 AM Virl Cagey, CMA  ?Note:   ?Referral and OV notes faxed via [facility]. ?[facility] ?Faxed to [phone]     ?[email or web address] ?  ?Virl Cagey, CMA on 03/16/2022 1147 - delivered at 03/16/2022 1147 ? ?

## 2022-03-18 ENCOUNTER — Other Ambulatory Visit: Payer: Self-pay | Admitting: Internal Medicine

## 2022-03-18 MED ORDER — HYDROCODONE-ACETAMINOPHEN 5-325 MG PO TABS: 1.0000 | ORAL_TABLET | Freq: Four times a day (QID) | ORAL | 0 refills | Status: DC | PRN

## 2022-03-18 NOTE — Telephone Encounter (Signed)
Name of Medication: Hydrocodone ?Name of Pharmacy: [facility] ?Last Fill or Written Date and Quantity: 03-01-22 #75 ?Last Office Visit and Type: 01-04-22 ?Next Office Visit and Type: 04-06-22 ?Last Controlled Substance Agreement Date: 03-05-21 ?Last UDS: 03-05-21 ?

## 2022-03-18 NOTE — Telephone Encounter (Signed)
?  Encourage patient to contact the pharmacy for refills or they can request refills through [facility] ? ?Did the patient contact the pharmacy:  N ? ? ?LAST APPOINTMENT DATE:  4.12.23 ? ?NEXT APPOINTMENT DATE:  5.16.23 ? ?MEDICATION:  HYDROcodone-acetaminophen (NORCO/VICODIN) 5-325 MG tablet ? ? ?PHARMACY:   ?[place], [place] - [place], [place] [address] Phone:  [phone]  ?Fax:  [phone]  ?  ? ? ?Let patient know to contact pharmacy at the end of the day to make sure medication is ready. ? ?Please notify patient to allow 48-72 hours to process ?  ?

## 2022-03-24 ENCOUNTER — Other Ambulatory Visit: Payer: Self-pay

## 2022-03-25 ENCOUNTER — Telehealth: Payer: Self-pay | Admitting: Internal Medicine

## 2022-03-25 MED ORDER — CLONAZEPAM 0.5 MG PO TABS: 0.5000 mg | ORAL_TABLET | Freq: Two times a day (BID) | ORAL | 0 refills | Status: DC | PRN

## 2022-03-25 NOTE — Telephone Encounter (Signed)
Pt called about pantoprazole (PROTONIX) 40 MG tablet, she said the pharmacy said it is requiring a PA and they were still waiting on [place]. Is there an update on the PA? Callback for pt is [phone] ?

## 2022-03-25 NOTE — Telephone Encounter (Signed)
Tried to call pt to let her know I did the PA on CoverMyMeds. Waiting on fax back with decision. ?

## 2022-03-25 NOTE — Telephone Encounter (Signed)
Pt called back and I read her the message below, she asked if we could call the pharmacy because they said they sent it 3 times. The number is [phone] ?

## 2022-03-25 NOTE — Telephone Encounter (Signed)
Tried to call pt. No answer and No vm. I have cleaned out my fax folder and I do not have a PA request. I also went on Cover My Meds and there was no request on there. ?

## 2022-03-25 NOTE — Addendum Note (Signed)
Addended by: Viviana Simpler I on: 03/25/2022 01:33 PM ? ? Modules accepted: Orders ? ?

## 2022-03-25 NOTE — Addendum Note (Signed)
Addended by: Pilar Grammes on: 03/25/2022 08:31 AM ? ? Modules accepted: Orders ? ?

## 2022-03-26 NOTE — Telephone Encounter (Signed)
Pt called back and I made her aware ?

## 2022-03-26 NOTE — Telephone Encounter (Addendum)
Approved on May 4 ?PA Case: [id number], Status: Approved, Coverage Starts on: 03/25/2022 12:00 AM, Coverage Ends on: 03/26/2023 12:00 AM. Questions? Contact [phone]. ? ?Tried to call pt to let her know. No answer and no VM. ?

## 2022-04-06 ENCOUNTER — Ambulatory Visit: Payer: 59 | Admitting: Internal Medicine

## 2022-04-21 ENCOUNTER — Encounter: Payer: Self-pay | Admitting: Internal Medicine

## 2022-04-21 ENCOUNTER — Ambulatory Visit (INDEPENDENT_AMBULATORY_CARE_PROVIDER_SITE_OTHER): Payer: 59 | Admitting: Internal Medicine

## 2022-04-21 VITALS — BP 130/84 | HR 80 | Temp 97.7°F | Ht 60.75 in | Wt 147.0 lb

## 2022-04-21 DIAGNOSIS — F1120 Opioid dependence, uncomplicated: Secondary | ICD-10-CM | POA: Diagnosis not present

## 2022-04-21 DIAGNOSIS — M549 Dorsalgia, unspecified: Secondary | ICD-10-CM | POA: Diagnosis not present

## 2022-04-21 DIAGNOSIS — F3341 Major depressive disorder, recurrent, in partial remission: Secondary | ICD-10-CM | POA: Diagnosis not present

## 2022-04-21 DIAGNOSIS — G8929 Other chronic pain: Secondary | ICD-10-CM | POA: Diagnosis not present

## 2022-04-21 MED ORDER — NORTRIPTYLINE HCL 10 MG PO CAPS: 10.0000 mg | ORAL_CAPSULE | Freq: Every day | ORAL | 3 refills | Status: DC

## 2022-04-21 MED ORDER — GABAPENTIN 100 MG PO CAPS: 200.0000 mg | ORAL_CAPSULE | Freq: Three times a day (TID) | ORAL | 3 refills | Status: DC

## 2022-04-21 MED ORDER — HYDROCODONE-ACETAMINOPHEN 5-325 MG PO TABS: 1.0000 | ORAL_TABLET | Freq: Four times a day (QID) | ORAL | 0 refills | Status: DC | PRN

## 2022-04-21 NOTE — Patient Instructions (Signed)
Please let me know if your depression isn't better in the next couple of weeks--we can consider increasing the venlafaxine.  You can consider trying only '100mg'$  of gabapentin for the daily doses---it may help your mind be more sharp.

## 2022-04-21 NOTE — Assessment & Plan Note (Signed)
PDMP reviewed No concerns 

## 2022-04-21 NOTE — Assessment & Plan Note (Signed)
Uses the hydrocodone tid mostly Will restart the gabapentin---200 tid (or can try lower doses in day) Also on nortriptyline

## 2022-04-21 NOTE — Assessment & Plan Note (Signed)
Seems worse now--but could be related to still grieving dad and running out of nortriptyline Will refill the nortriptyline If worsening persists, could increase venlafaxine from 75 to 150 Continue fluoxetine 40

## 2022-04-21 NOTE — Progress Notes (Signed)
Subjective:    Patient ID: Heather Russo, female    DOB: September 25, 1966, 56 y.o.   MRN: [id number]  HPI Here for follow up of chronic pain and mood issues  Hasn't been able to get hold of Dr Melrose Nakayama Has run out of nortriptyline and gabapentin--I will refill  Now caring for mom more--brings her for appts Takes her for shopping (uses motorized cart) She still does --her ADLs Not working still  Pain is about the same---some bad days Takes the hydrocodone up to three times a day  Clonazepam for nerves Now usually taking bid on average  Depression some worse First father's day coming up without her dad Things "pop in my head" and she gets crying Sometimes when driving--she will forget where she is driving Thinks about dying--but no true suicidal ideation (for daughter and grandkids) Seems worse since running out of nortriptyline  Current Outpatient Medications on File Prior to Visit  Medication Sig Dispense Refill   albuterol (VENTOLIN HFA) 108 (90 Base) MCG/ACT inhaler Inhale 2 puffs into the lungs every 6 (six) hours as needed for wheezing or shortness of breath. Use with spacer 18 g 1   clonazePAM (KLONOPIN) 0.5 MG tablet Take 1 tablet (0.5 mg total) by mouth 2 (two) times daily as needed. 90 tablet 0   fexofenadine (ALLEGRA) 180 MG tablet Take 180 mg by mouth daily as needed.     FLUoxetine (PROZAC) 40 MG capsule Take 1 capsule by mouth once daily 90 capsule 3   gabapentin (NEURONTIN) 100 MG capsule Take 2 capsules by mouth 3 (three) times daily.     HYDROcodone-acetaminophen (NORCO/VICODIN) 5-325 MG tablet Take 1 tablet by mouth every 6 (six) hours as needed for moderate pain. 75 tablet 0   pantoprazole (PROTONIX) 40 MG tablet Take 1 tablet by mouth twice daily 60 tablet 11   traZODone (DESYREL) 50 MG tablet Take 1/2-1 tab nightly 30 tablet 11   triamcinolone cream (KENALOG) 0.1 % Apply 1 application topically 2 (two) times daily as needed. 45 g 1   venlafaxine XR (EFFEXOR-XR) 75  MG 24 hr capsule Take 3 capsules (225 mg total) by mouth daily. 90 capsule 11   nortriptyline (PAMELOR) 10 MG capsule Take 1 capsule (10 mg total) by mouth at bedtime. 90 capsule 3   No current facility-administered medications on file prior to visit.    Allergies  Allergen Reactions   Bupropion Hcl     REACTION: rash, nausea, bad taste in mouth, blurred vision   Lisinopril     REACTION: cough,dizziness   Mirtazapine     REACTION: unspecified   Mysoline [Primidone] Hives    Likely cause of hives    Past Medical History:  Diagnosis Date   Allergic rhinitis    Allergy    Anxiety    Cancer (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    Syncope and collapse    Tremor, essential     Past Surgical History:  Procedure Laterality Date   ESOPHAGOGASTRODUODENOSCOPY  05/05/06   LAPAROSCOPIC CHOLECYSTECTOMY  03/2008   wisdom teeth removeal      Family History  Problem Relation Age of Onset   Hypertension Mother    Tremor Mother    Hypertension Father  Heart attack Father    Diabetes Father    Heart Problems Father    Hypertension Brother    Diabetes Brother    Hypertension Brother    Tremor Brother    Cancer Paternal Grandmother        Lung   Colon cancer Neg Hx    Esophageal cancer Neg Hx    Pancreatic cancer Neg Hx    Rectal cancer Neg Hx    Stomach cancer Neg Hx    Breast cancer Neg Hx     Social History   Socioeconomic History   Marital status: Widowed    Spouse name: Not on file   Number of children: 1   Years of education: Not on file   Highest education level: Not on file  Occupational History   Occupation:  Inventory clerk-- laid off 2015    Comment:     Occupation: Engineer, materials    Comment: unemployed now  Tobacco Use   Smoking status: Former    Packs/day: 1.00    Types:  Cigarettes    Quit date: 01/02/2018    Years since quitting: 4.3    Passive exposure: Past (as a child)   Smokeless tobacco: Never   Tobacco comments:       Substance and Sexual Activity   Alcohol use: Yes    Alcohol/week: 0.0 standard drinks    Comment: rare   Drug use: No   Sexual activity: Not on file  Other Topics Concern   Not on file  Social History Narrative   Widowed 10/12   Engaged with 2 year relationship--but he died 18-Sep-2023   Social Determinants of Health   Financial Resource Strain: Not on file  Food Insecurity: Not on file  Transportation Needs: Not on file  Physical Activity: Not on file  Stress: Not on file  Social Connections: Not on file  Intimate Partner Violence: Not on file   Review of Systems Sleeps okay Appetite is so-so Weight is stable    Objective:   Physical Exam Constitutional:      Appearance: Normal appearance.  Neurological:     Mental Status: She is alert.  Psychiatric:     Comments: Some depressed mood and tears           Assessment & Plan:

## 2022-04-24 LAB — DRUG MONITORING, PANEL 8 WITH CONFIRMATION, URINE
6 Acetylmorphine: NEGATIVE ng/mL (ref <10)
Alcohol Metabolites: NEGATIVE ng/mL (ref <500)
Alphahydroxyalprazolam: NEGATIVE ng/mL (ref <25)
Alphahydroxymidazolam: NEGATIVE ng/mL (ref <50)
Alphahydroxytriazolam: NEGATIVE ng/mL (ref <50)
Aminoclonazepam: 499 ng/mL — ABNORMAL HIGH (ref <25)
Amphetamines: NEGATIVE ng/mL (ref <500)
Benzodiazepines: POSITIVE ng/mL — AB (ref <100)
Buprenorphine, Urine: NEGATIVE ng/mL (ref <5)
Cocaine Metabolite: NEGATIVE ng/mL (ref <150)
Codeine: NEGATIVE ng/mL (ref <50)
Creatinine: 119.1 mg/dL (ref >=20.0)
Hydrocodone: 555 ng/mL — ABNORMAL HIGH (ref <50)
Hydromorphone: NEGATIVE ng/mL (ref <50)
Hydroxyethylflurazepam: NEGATIVE ng/mL (ref <50)
Lorazepam: NEGATIVE ng/mL (ref <50)
MDMA: NEGATIVE ng/mL (ref <500)
Marijuana Metabolite: 1665 ng/mL — ABNORMAL HIGH (ref <5)
Marijuana Metabolite: POSITIVE ng/mL — AB (ref <20)
Morphine: NEGATIVE ng/mL (ref <50)
Nordiazepam: NEGATIVE ng/mL (ref <50)
Norhydrocodone: 816 ng/mL — ABNORMAL HIGH (ref <50)
Opiates: POSITIVE ng/mL — AB (ref <100)
Oxazepam: NEGATIVE ng/mL (ref <50)
Oxidant: NEGATIVE ug/mL (ref <200)
Oxycodone: NEGATIVE ng/mL (ref <100)
Temazepam: NEGATIVE ng/mL (ref <50)
pH: 6.7 (ref 4.5–9.0)

## 2022-04-24 LAB — DM TEMPLATE

## 2022-04-26 ENCOUNTER — Other Ambulatory Visit: Payer: Self-pay | Admitting: Internal Medicine

## 2022-04-29 MED ORDER — CLONAZEPAM 0.5 MG PO TABS: 0.5000 mg | ORAL_TABLET | Freq: Two times a day (BID) | ORAL | 0 refills | Status: DC | PRN

## 2022-04-29 NOTE — Telephone Encounter (Signed)
Last office visit 04/21/22 for MDD.  Last refilled 03/25/22 for #90 with no refills.  Next Appt: 07/21/22 for 3 month follow up.

## 2022-05-17 ENCOUNTER — Telehealth: Payer: Self-pay

## 2022-05-21 ENCOUNTER — Other Ambulatory Visit: Payer: Self-pay

## 2022-05-21 NOTE — Telephone Encounter (Signed)
Name of Medication: Hydrocodone Name of [facility]: [facility] Last Fill or Written Date and Quantity: 04-21-22 #75 Last Office Visit and Type: 04-21-22 Next Office Visit and Type: 07-21-22 Last Controlled Substance Agreement Date: 03-05-21 Last UDS: 03-05-21

## 2022-05-21 NOTE — Telephone Encounter (Signed)
MEDICATION: HYDROcodone-acetaminophen (NORCO/VICODIN) 5-325 MG tablet   PHARMACY: [place], [place] - [address]  Comments:  Patient is completely out.   **Let patient know to contact pharmacy at the end of the day to make sure medication is ready. **  ** Please notify patient to allow 48-72 hours to process**  **Encourage patient to contact the pharmacy for refills or they can request refills through [facility]**

## 2022-05-23 MED ORDER — HYDROCODONE-ACETAMINOPHEN 5-325 MG PO TABS: 1.0000 | ORAL_TABLET | Freq: Four times a day (QID) | ORAL | 0 refills | Status: DC | PRN

## 2022-05-28 NOTE — Telephone Encounter (Signed)
Unfortunately everywhere is booked out for an extended period of time unless they have cancellations. The only way to know this is if the patient calls periodically to check for sooner appts. Another option is we could send to [facility] in [place] as they have been able to work patients in sooner but we will need to know if [facility]) is In-Network with the patients insurance prior to sending the referral. Please have the patient contact their insurance to see who is in Network, once we know, we can send the referral to them to review.

## 2022-05-31 NOTE — Telephone Encounter (Signed)
Spoke to pt. She said she is scheduled for 07-21-22. Will just hold on to that one and may call for a cancellation.

## 2022-06-17 ENCOUNTER — Other Ambulatory Visit: Payer: Self-pay | Admitting: Internal Medicine

## 2022-06-17 NOTE — Telephone Encounter (Signed)
[name] Name: Kieu Quiggle Call back phone #: [phone]  MEDICATION(S): clonazePAM (KLONOPIN) 0.5 MG tablet  Days of Med Remaining: 4 pills   Has the patient contacted their pharmacy (YES/NO)? No, controlled What did pharmacy advise?  Preferred Pharmacy: [facility] in [place]  ~~~Please advise patient/caregiver to allow 2-3 business days to process RX refills.

## 2022-06-17 NOTE — Telephone Encounter (Signed)
Last filled 04-29-22 #90 Last OV 04-21-22 Next OV 07-22-22 [facility] [place]

## 2022-06-18 MED ORDER — CLONAZEPAM 0.5 MG PO TABS: 0.5000 mg | ORAL_TABLET | Freq: Two times a day (BID) | ORAL | 0 refills | Status: DC | PRN

## 2022-06-21 ENCOUNTER — Other Ambulatory Visit: Payer: Self-pay | Admitting: Internal Medicine

## 2022-06-21 MED ORDER — HYDROCODONE-ACETAMINOPHEN 5-325 MG PO TABS: 1.0000 | ORAL_TABLET | Freq: Four times a day (QID) | ORAL | 0 refills | Status: DC | PRN

## 2022-06-21 NOTE — Telephone Encounter (Signed)
Name of Medication: Hydrocodone Name of Pharmacy: [facility] [place] Last Fill or Written Date and Quantity: 05-23-22 #75 Last Office Visit and Type: 04-21-22 Next Office Visit and Type: 07-22-22 Last Controlled Substance Agreement Date: 04-21-22 Last UDS: 04-21-22

## 2022-06-21 NOTE — Telephone Encounter (Signed)
  Encourage patient to contact the pharmacy for refills or they can request refills through [facility]  Did the patient contact the pharmacy:  no (controlled substance)   LAST APPOINTMENT DATE:  Please schedule appointment if longer than 1 year  NEXT APPOINTMENT DATE:07/22/2022  MEDICATION:HYDROcodone-acetaminophen (NORCO/VICODIN) 5-325 MG tablet  Is the patient out of medication? yes  If not, how much is left?  Is this a 90 day supply: no  PHARMACY: [place], [place], [place] [address] Phone:  [phone]  Fax:  [phone]      Let patient know to contact pharmacy at the end of the day to make sure medication is ready.  Please notify patient to allow 48-72 hours to process

## 2022-06-28 ENCOUNTER — Encounter: Payer: Self-pay | Admitting: Emergency Medicine

## 2022-06-28 ENCOUNTER — Emergency Department
Admission: EM | Admit: 2022-06-28 | Discharge: 2022-06-28 | Disposition: A | Discharge status: Home / Self Care | Payer: 59 | Attending: Emergency Medicine | Admitting: Emergency Medicine

## 2022-06-28 ENCOUNTER — Other Ambulatory Visit: Payer: Self-pay

## 2022-06-28 ENCOUNTER — Emergency Department: Payer: 59

## 2022-06-28 DIAGNOSIS — J45909 Unspecified asthma, uncomplicated: Secondary | ICD-10-CM | POA: Diagnosis not present

## 2022-06-28 DIAGNOSIS — R109 Unspecified abdominal pain: Secondary | ICD-10-CM | POA: Insufficient documentation

## 2022-06-28 DIAGNOSIS — I10 Essential (primary) hypertension: Secondary | ICD-10-CM | POA: Insufficient documentation

## 2022-06-28 LAB — CBC WITH DIFFERENTIAL/PLATELET
Abs Immature Granulocytes: 0.04 10*3/uL (ref 0.00–0.07)
Basophils Absolute: 0.1 10*3/uL (ref 0.0–0.1)
Basophils Relative: 1 %
Eosinophils Absolute: 0.2 10*3/uL (ref 0.0–0.5)
Eosinophils Relative: 2 %
HCT: 43.3 % (ref 36.0–46.0)
Hemoglobin: 14.2 g/dL (ref 12.0–15.0)
Immature Granulocytes: 0 %
Lymphocytes Relative: 34 %
Lymphs Abs: 3.7 10*3/uL (ref 0.7–4.0)
MCH: 30 pg (ref 26.0–34.0)
MCHC: 32.8 g/dL (ref 30.0–36.0)
MCV: 91.5 fL (ref 80.0–100.0)
Monocytes Absolute: 0.6 10*3/uL (ref 0.1–1.0)
Monocytes Relative: 6 %
Neutro Abs: 6.3 10*3/uL (ref 1.7–7.7)
Neutrophils Relative %: 57 %
Platelets: 342 10*3/uL (ref 150–400)
RBC: 4.73 MIL/uL (ref 3.87–5.11)
RDW: 13.4 % (ref 11.5–15.5)
WBC: 10.9 10*3/uL — ABNORMAL HIGH (ref 4.0–10.5)
nRBC: 0 % (ref 0.0–0.2)

## 2022-06-28 LAB — COMPREHENSIVE METABOLIC PANEL
ALT: 17 U/L (ref 0–44)
AST: 20 U/L (ref 15–41)
Albumin: 3.7 g/dL (ref 3.5–5.0)
Alkaline Phosphatase: 104 U/L (ref 38–126)
Anion gap: 5 (ref 5–15)
BUN: 11 mg/dL (ref 6–20)
CO2: 28 mmol/L (ref 22–32)
Calcium: 9.1 mg/dL (ref 8.9–10.3)
Chloride: 106 mmol/L (ref 98–111)
Creatinine, Ser: 0.9 mg/dL (ref 0.44–1.00)
GFR, Estimated: >60 mL/min (ref >60)
Glucose, Bld: 92 mg/dL (ref 70–99)
Potassium: 4 mmol/L (ref 3.5–5.1)
Sodium: 139 mmol/L (ref 135–145)
Total Bilirubin: 0.5 mg/dL (ref 0.3–1.2)
Total Protein: 7.1 g/dL (ref 6.5–8.1)

## 2022-06-28 LAB — URINALYSIS, ROUTINE W REFLEX MICROSCOPIC
Bilirubin Urine: NEGATIVE
Glucose, UA: NEGATIVE mg/dL
Hgb urine dipstick: NEGATIVE
Ketones, ur: NEGATIVE mg/dL
Nitrite: NEGATIVE
Protein, ur: NEGATIVE mg/dL
Specific Gravity, Urine: 1.026 (ref 1.005–1.030)
pH: 5 (ref 5.0–8.0)

## 2022-06-28 LAB — LIPASE, BLOOD: Lipase: 31 U/L (ref 11–51)

## 2022-06-28 MED ORDER — CEFDINIR 300 MG PO CAPS: 300.0000 mg | ORAL_CAPSULE | Freq: Two times a day (BID) | ORAL | 0 refills | Status: AC

## 2022-06-28 MED ORDER — KETOROLAC TROMETHAMINE 15 MG/ML IJ SOLN
15.0000 mg | Freq: Once | INTRAMUSCULAR | Status: AC
  Administered 2022-06-28: 15 mg via INTRAVENOUS
  Filled 2022-06-28: qty 1

## 2022-06-28 MED ORDER — IOHEXOL 300 MG/ML  SOLN
100.0000 mL | Freq: Once | INTRAMUSCULAR | Status: AC | PRN
  Administered 2022-06-28: 100 mL via INTRAVENOUS

## 2022-06-28 MED ORDER — SODIUM CHLORIDE 0.9 % IV SOLN
1.0000 g | INTRAVENOUS | Status: DC
  Administered 2022-06-28: 1 g via INTRAVENOUS
  Filled 2022-06-28: qty 10

## 2022-06-28 NOTE — ED Notes (Signed)
See triage note  Presents with pain to right flank/abd area  states pain started about 2 weeks ago  Has been seen for same  but pain conts'

## 2022-06-28 NOTE — ED Triage Notes (Signed)
Patient arrives ambulatory by POV c/o right sided rib cage / flank pain x 2 weeks. Was at hospital in [place] and given antibiotics for kidney infection but symptoms have no improved.

## 2022-06-28 NOTE — ED Notes (Signed)
Patient states she was seen in [place] two weeks ago and diagnosed with a kidney infection. Finished antibiotics and has not seen any improvement in pain.

## 2022-06-28 NOTE — ED Provider Notes (Signed)
[facility] Provider Note    Event Date/Time   First MD Initiated Contact with Patient 06/28/22 1804     (approximate)   History   Flank Pain   HPI  Heather Russo is a 56 y.o. female with a past medical history of hypertension, chronic back pain on narcotic medicine, depression who presents today for evaluation of right-sided flank pain.  Patient reports that this has been ongoing for approximately 3 weeks.  She just finished antibiotics for pyelonephritis.  She feels that her symptoms or not improving.  She has not had any nausea or vomiting.  No fevers or chills.  No change in her bowel movements.  She denies any radiation of her pain.  She does not have a gallbladder.  Patient Active Problem List   Diagnosis Date Noted   Hoarseness 03/03/2022   Allergic asthma 10/13/2020   Narcotic dependence ([place]) 08/03/2018   Encounter for chronic pain management 05/17/2017   Special screening for malignant neoplasms, colon 05/28/2016   Esophageal dysphagia 05/18/2016   MDD (major depressive disorder), recurrent, in partial remission ([place]) 12/24/2015   Chronic back pain 12/24/2015   Routine general medical examination at a health care facility 04/03/2014   Essential tremor 01/22/2011   COMMON MIGRAINE 04/03/2007   Essential hypertension, benign 04/03/2007   ALLERGIC RHINITIS 04/03/2007   GERD 04/03/2007          Physical Exam   Triage Vital Signs: ED Triage Vitals  Enc Vitals Group     BP 06/28/22 1707 134/77     Pulse Rate 06/28/22 1707 69     Resp 06/28/22 1707 16     Temp 06/28/22 1707 98.2 F (36.8 C)     Temp Source 06/28/22 1707 Oral     SpO2 06/28/22 1707 99 %     Weight 06/28/22 1708 143 lb (64.9 kg)     Height 06/28/22 1708 '5\' 1"'$  (1.549 m)     Head Circumference --      Peak Flow --      Pain Score 06/28/22 1708 10     Pain Loc --      Pain Edu? --      Excl. in [place]? --     Most recent vital signs: Vitals:   06/28/22 1832 06/28/22  1945  BP: 120/71 133/87  Pulse: 72 64  Resp: 17 16  Temp:  98 F (36.7 C)  SpO2: 94% 94%    Physical Exam Vitals and nursing note reviewed.  Constitutional:      General: Awake and alert. No acute distress.    Appearance: Normal appearance. The patient is normal weight.  HENT:     Head: Normocephalic and atraumatic.     Mouth: Mucous membranes are moist.  Eyes:     General: PERRL. Normal EOMs        Right eye: No discharge.        Left eye: No discharge.     Conjunctiva/sclera: Conjunctivae normal.  Cardiovascular:     Rate and Rhythm: Normal rate and regular rhythm.  Normal pulses in all 4 extremities.    Pulses: Normal pulses.     Heart sounds: Normal heart sounds Pulmonary:     Effort: Pulmonary effort is normal. No respiratory distress.     Breath sounds: Normal breath sounds.  Abdominal:     Abdomen is soft. There is no abdominal tenderness. No rebound or guarding. No distention. Mild right CVAT Musculoskeletal:  General: No swelling. Normal range of motion.     Cervical back: Normal range of motion and neck supple.  Skin:    General: Skin is warm and dry.     Capillary Refill: Capillary refill takes less than 2 seconds.     Findings: No rash.  Neurological:     Mental Status: The patient is awake and alert.      ED Results / Procedures / Treatments   Labs (all labs ordered are listed, but only abnormal results are displayed) Labs Reviewed  URINALYSIS, ROUTINE W REFLEX MICROSCOPIC - Abnormal; Notable for the following components:      Result Value   Color, Urine YELLOW (*)    APPearance HAZY (*)    Leukocytes,Ua TRACE (*)    Bacteria, UA RARE (*)    All other components within normal limits  CBC WITH DIFFERENTIAL/PLATELET - Abnormal; Notable for the following components:   WBC 10.9 (*)    All other components within normal limits  COMPREHENSIVE METABOLIC PANEL  LIPASE, BLOOD     EKG     RADIOLOGY I independently reviewed and interpreted  imaging and agree with radiologists findings.     PROCEDURES:  Critical Care performed:   Procedures   MEDICATIONS ORDERED IN ED: Medications  cefTRIAXone (ROCEPHIN) 1 g in sodium chloride 0.9 % 100 mL IVPB (0 g Intravenous Stopped 06/28/22 1945)  iohexol (OMNIPAQUE) 300 MG/ML solution 100 mL (100 mLs Intravenous Contrast Given 06/28/22 1805)  ketorolac (TORADOL) 15 MG/ML injection 15 mg (15 mg Intravenous Given 06/28/22 1911)     IMPRESSION / MDM / ASSESSMENT AND PLAN / ED COURSE  I reviewed the triage vital signs and the nursing notes.   Differential diagnosis includes, but is not limited to, continued UTI/pyelonephritis, kidney stone, gallbladder disease, other intra-abdominal abnormality.  Patient is awake and alert, hemodynamically stable and afebrile.  Labs obtained in triage are overall reassuring.  Urinalysis reveals leukocytes and rare bacteria.  She is currently on antibiotics, last day today, likely incomplete treatment.  Also has mild leukocytosis to 10.9.  She continues to have CVA tenderness.  CT scan demonstrates no acute intra-abdominal findings.  Given that she is still symptomatic from what appears to be an incompletely treated UTI/pyonephritis, she was given a dose of Rocephin in the emergency department and started on cefdinir which has better kidney penetration.  She has normal pulses in all 4 extremities, no chest pain or back pain to suggest vascular catastrophe.  No neurological findings.  No chest pain, shortness of breath, pleurisy, or clinical signs or symptoms of DVT to suggest PE as a source of her symptoms today.  We did discuss strict return precautions and the importance of close outpatient follow-up.  Patient understands and agrees with plan.  Discharged in stable condition.   Patient's presentation is most consistent with acute complicated illness / injury requiring diagnostic workup.        FINAL CLINICAL IMPRESSION(S) / ED DIAGNOSES   Final diagnoses:   Right flank pain     Rx / DC Orders   ED Discharge Orders          Ordered    cefdinir (OMNICEF) 300 MG capsule  2 times daily        06/28/22 1929             Note:  This document was prepared using Dragon voice recognition software and may include unintentional dictation errors.   Marquette Old, PA-C 06/28/22 2151  Delman Kitten, MD 06/29/22 Laureen Abrahams

## 2022-06-28 NOTE — ED Provider Triage Note (Signed)
  Emergency Medicine Provider Triage Evaluation Note  Heather Russo , a 56 y.o.female,  was evaluated in triage.  Pt complains of right-sided flank pain x 2 weeks.  She states that she was recently in the hospital for a kidney infection, for which they gave her antibiotics for.  However, she states that her symptoms are not improving and her pain persist.   Review of Systems  Positive: Right-sided flank pain Negative: Denies fever, chest pain, vomiting  Physical Exam  There were no vitals filed for this visit. Gen:   Awake, no distress   Resp:  Normal effort  MSK:   Moves extremities without difficulty  Other:    Medical Decision Making  Given the patient's initial medical screening exam, the following diagnostic evaluation has been ordered. The patient will be placed in the appropriate treatment space, once one is available, to complete the evaluation and treatment. I have discussed the plan of care with the patient and I have advised the patient that an ED physician or mid-level practitioner will reevaluate their condition after the test results have been received, as the results may give them additional insight into the type of treatment they may need.    Diagnostics: Labs, UA, CT abdomen/pelvis  Treatments: none immediately   Teodoro Spray, [place] 06/28/22 1649

## 2022-06-28 NOTE — Discharge Instructions (Signed)
Take the antibiotics as prescribed. Return for new, worsening, or change in symptoms or other concerns.

## 2022-06-29 ENCOUNTER — Telehealth: Payer: Self-pay

## 2022-06-29 NOTE — Telephone Encounter (Signed)
Tried to call pt to see how she was doing after recent ER visit for flank pain. No answer and no VM set up to leave a message.

## 2022-07-01 ENCOUNTER — Ambulatory Visit (INDEPENDENT_AMBULATORY_CARE_PROVIDER_SITE_OTHER): Payer: 59 | Admitting: Internal Medicine

## 2022-07-01 ENCOUNTER — Ambulatory Visit (INDEPENDENT_AMBULATORY_CARE_PROVIDER_SITE_OTHER)
Admission: RE | Admit: 2022-07-01 | Discharge: 2022-07-01 | Disposition: A | Discharge status: Home / Self Care | Payer: 59 | Source: Ambulatory Visit | Attending: Internal Medicine | Admitting: Internal Medicine

## 2022-07-01 ENCOUNTER — Telehealth: Payer: Self-pay

## 2022-07-01 ENCOUNTER — Encounter: Payer: Self-pay | Admitting: Internal Medicine

## 2022-07-01 VITALS — BP 122/82 | HR 84 | Temp 97.6°F | Ht 60.75 in | Wt 144.0 lb

## 2022-07-01 DIAGNOSIS — R0781 Pleurodynia: Secondary | ICD-10-CM

## 2022-07-01 MED ORDER — HYDROCODONE BIT-HOMATROP MBR 5-1.5 MG/5ML PO SOLN: 5.0000 mL | Freq: Every evening | ORAL | 0 refills | Status: DC | PRN

## 2022-07-01 NOTE — Progress Notes (Signed)
Subjective:    Patient ID: Heather Russo, female    DOB: [birth year], 56 y.o.   MRN: [id number]  HPI Here for ER follow up---ongoing flank pain  2+ weeks ago--went to ER for right flank pain Diagnosed with UTI at [place]--but not dysuria or hematuria They gave antibiotic  Has pain under right breast with cough or deep breath Goes around to back No rash 3 days ago--[facility] to [facility]  CT negative Urinalysis was benign  Hydrocodone helps pain a little  Current Outpatient Medications on File Prior to Visit  Medication Sig Dispense Refill   albuterol (VENTOLIN HFA) 108 (90 Base) MCG/ACT inhaler Inhale 2 puffs into the lungs every 6 (six) hours as needed for wheezing or shortness of breath. Use with spacer 18 g 1   cefdinir (OMNICEF) 300 MG capsule Take 1 capsule (300 mg total) by mouth 2 (two) times daily for 10 days. 20 capsule 0   clonazePAM (KLONOPIN) 0.5 MG tablet Take 1 tablet (0.5 mg total) by mouth 2 (two) times daily as needed. 90 tablet 0   fexofenadine (ALLEGRA) 180 MG tablet Take 180 mg by mouth daily as needed.     FLUoxetine (PROZAC) 40 MG capsule Take 1 capsule by mouth once daily 90 capsule 3   gabapentin (NEURONTIN) 100 MG capsule Take 2 capsules (200 mg total) by mouth 3 (three) times daily. 540 capsule 3   HYDROcodone-acetaminophen (NORCO/VICODIN) 5-325 MG tablet Take 1 tablet by mouth every 6 (six) hours as needed for moderate pain. 75 tablet 0   nortriptyline (PAMELOR) 10 MG capsule Take 1 capsule (10 mg total) by mouth at bedtime. 90 capsule 3   pantoprazole (PROTONIX) 40 MG tablet Take 1 tablet by mouth twice daily 60 tablet 11   traZODone (DESYREL) 50 MG tablet Take 1/2-1 tab nightly 30 tablet 11   triamcinolone cream (KENALOG) 0.1 % Apply 1 application topically 2 (two) times daily as needed. 45 g 1   venlafaxine XR (EFFEXOR-XR) 75 MG 24 hr capsule Take 3 capsules (225 mg total) by mouth daily. 90 capsule 11   No current facility-administered medications on file  prior to visit.    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    Syncope and collapse    Tremor, essential     Past Surgical History:  Procedure Laterality Date   ESOPHAGOGASTRODUODENOSCOPY  05/05/06   LAPAROSCOPIC CHOLECYSTECTOMY  03/2008   wisdom teeth removeal      Family History  Problem Relation Age of Onset   Hypertension Mother    Tremor Mother    Hypertension Father    Heart attack Father    Diabetes Father    Heart Problems Father    Hypertension Brother    Diabetes Brother    Hypertension Brother    Tremor Brother  Cancer Paternal Grandmother        Lung   Colon cancer Neg Hx    Esophageal cancer Neg Hx    Pancreatic cancer Neg Hx    Rectal cancer Neg Hx    Stomach cancer Neg Hx    Breast cancer Neg Hx     Social History   Socioeconomic History   Marital status: Widowed    Spouse name: Not on file   Number of children: 1   Years of education: Not on file   Highest education level: Not on file  Occupational History   Occupation:  Inventory clerk-- laid off 2015    Comment:     Occupation: Engineer, materials    Comment: unemployed now  Tobacco Use   Smoking status: Former    Packs/day: 1.00    Types: Cigarettes    Quit date: 01/02/2018    Years since quitting: 4.4    Passive exposure: Past (as a child)   Smokeless tobacco: Never   Tobacco comments:       Substance and Sexual Activity   Alcohol use: Yes    Alcohol/week: 0.0  standard drinks of alcohol    Comment: rare   Drug use: No   Sexual activity: Not on file  Other Topics Concern   Not on file  Social History Narrative   Widowed 10/12   Engaged with 2 year relationship--but he died 09/28/2023   Social Determinants of Health   Financial Resource Strain: Not on file  Food Insecurity: Not on file  Transportation Needs: Not on file  Physical Activity: Not on file  Stress: Not on file  Social Connections: Not on file  Intimate Partner Violence: Not on file   Review of Systems No fever Gets SOB if she is doing something--due to the pleuritic pain    Objective:   Physical Exam Constitutional:      General: She is not in acute distress.    Comments: Some coarse cough  Cardiovascular:     Rate and Rhythm: Normal rate and regular rhythm.     Heart sounds: No murmur heard.    No gallop.  Pulmonary:     Effort: Pulmonary effort is normal.     Breath sounds: Normal breath sounds. No wheezing or rales.  Skin:    Comments: No rash on chest wall  Neurological:     Mental Status: She is alert.            Assessment & Plan:

## 2022-07-01 NOTE — Telephone Encounter (Signed)
[facility] called and they do not have the prescription,  HYDROcodone Bit-Homatrop MBr 5-1.5 MG/5ML  in stock.

## 2022-07-01 NOTE — Assessment & Plan Note (Addendum)
Never really seemed urinary Now on second course of antibiotics Not consistent with shingles Had scarring on right on CT scan but incomplete Will check CXR  CXR negative Will give some cough syrup

## 2022-07-02 NOTE — Telephone Encounter (Signed)
Spoke to pt. She said Dr Silvio Pate is aware that she has had to take more hydrocodone tabs due to her pain. I did advise her it will be later this afternoon before he will get on to see his messages. I will call her as soon as I know the plan.

## 2022-07-02 NOTE — Telephone Encounter (Signed)
Patient called in following up with this concerned. She stated will he give her something else or will he refill her Vicodin. Thank you!

## 2022-07-02 NOTE — Telephone Encounter (Signed)
Spoke to pt. She said she has about 12 left. Will call when she needs the refill.

## 2022-07-15 ENCOUNTER — Other Ambulatory Visit: Payer: Self-pay

## 2022-07-15 MED ORDER — HYDROCODONE-ACETAMINOPHEN 5-325 MG PO TABS: 1.0000 | ORAL_TABLET | Freq: Four times a day (QID) | ORAL | 0 refills | Status: DC | PRN

## 2022-07-15 NOTE — Telephone Encounter (Signed)
MEDICATION: HYDROcodone-acetaminophen (NORCO/VICODIN) 5-325 MG tablet  PHARMACY: [place], [place], [address]  Comments: Patient has only a few left   **Let patient know to contact pharmacy at the end of the day to make sure medication is ready. **  ** Please notify patient to allow 48-72 hours to process**  **Encourage patient to contact the pharmacy for refills or they can request refills through [facility]**

## 2022-07-15 NOTE — Telephone Encounter (Signed)
Name of Medication: Hydrocodone [facility] of [facility] Last Fill or Written Date and Quantity: 06-21-22 #75 Last Office Visit and Type: 07-01-22 Next Office Visit and Type: 09-16-22 Last Controlled Substance Agreement Date: 04-21-22 Last UDS: 04-21-22

## 2022-07-16 ENCOUNTER — Telehealth: Payer: Self-pay | Admitting: Internal Medicine

## 2022-07-16 NOTE — Telephone Encounter (Signed)
Patient called in stating that she had an appointment with [facility] on August 30 but now they have nothing for her. She stated she called both locations and they have no referral at all. Patient stated she needed to see them. She would like for someone to return a call in regards to this. From phone note on 05/17/22 I see the patient stated she had an appointment but not sure what is going on. Please advise. Thank you!

## 2022-07-20 ENCOUNTER — Telehealth: Payer: Self-pay | Admitting: Internal Medicine

## 2022-07-20 DIAGNOSIS — R1310 Dysphagia, unspecified: Secondary | ICD-10-CM

## 2022-07-20 NOTE — Telephone Encounter (Signed)
  Encourage patient to contact the pharmacy for refills or they can request refills through [facility]  Did the patient contact the pharmacy:  y   LAST APPOINTMENT DATE:  Please schedule appointment if longer than 1 year  NEXT APPOINTMENT DATE:09/16/22  MEDICATION:HYDROcodone-acetaminophen (NORCO/VICODIN) 5-325 MG tablet  Is the patient out of medication? n  If not, how much is left? 1 day left  Is this a 90 day supply: 75 tablets  PHARMACY: [facility] [address], [place] - , [address] Phone:  [phone]  Fax:  [phone]      Let patient know to contact pharmacy at the end of the day to make sure medication is ready.  Please notify patient to allow 48-72 hours to process

## 2022-07-20 NOTE — Addendum Note (Signed)
Addended by: Viviana Simpler I on: 07/20/2022 01:00 PM   Modules accepted: Orders

## 2022-07-20 NOTE — Telephone Encounter (Signed)
She is confident that she made her own appt for 07-21-22 with Dr Tami Ribas. Pt called the other day and she was told she did not have an appointment. Needs a new referral URGENT as things are starting to get stuck in her throat. Prefers [facility], [facility], [facility], or [facility].

## 2022-07-20 NOTE — Telephone Encounter (Signed)
Patient called in asking about a referral being sent out to [facility]. She stated that she thought she had an appointment on tomorrow 8/30, but when she called,they did not have her down for an appointment. They would need another referral sent in.

## 2022-07-20 NOTE — Telephone Encounter (Signed)
Called pt unable to LM states can't complete call at this time.   We need to know what pt current insurance is and to have her upload a copy to [facility] or bring a copy in. So we can process the referral.

## 2022-07-20 NOTE — Telephone Encounter (Signed)
Spoke to pt. Advised her the refill was sent 07-15-22. She will contact the pharmacy.

## 2022-07-21 ENCOUNTER — Ambulatory Visit: Payer: 59 | Admitting: Internal Medicine

## 2022-07-22 ENCOUNTER — Ambulatory Visit: Payer: 59 | Admitting: Internal Medicine

## 2022-08-03 NOTE — Telephone Encounter (Signed)
See referral notes  Waiting on patient to get back with regarding her new insurance  ----- Message ----- From: Rosiland Oz Sent: 08/02/2022  11:49 AM EDT To: Wynell Balloon   Patient called in stating she hasn't heard anything regarding her referral. Informed patient that you was trying to reach out to her regarding what type of insurance she will have since Friday no longer accepted. She stated she will be speaking with someone today or tomorrow, and will call back with that information. Thank you!

## 2022-08-05 DIAGNOSIS — M79673 Pain in unspecified foot: Secondary | ICD-10-CM | POA: Diagnosis not present

## 2022-08-05 DIAGNOSIS — X58XXXA Exposure to other specified factors, initial encounter: Secondary | ICD-10-CM | POA: Diagnosis not present

## 2022-08-05 DIAGNOSIS — I10 Essential (primary) hypertension: Secondary | ICD-10-CM | POA: Diagnosis not present

## 2022-08-05 DIAGNOSIS — X501XXA Overexertion from prolonged static or awkward postures, initial encounter: Secondary | ICD-10-CM | POA: Diagnosis not present

## 2022-08-05 DIAGNOSIS — M7732 Calcaneal spur, left foot: Secondary | ICD-10-CM | POA: Diagnosis not present

## 2022-08-05 DIAGNOSIS — R69 Illness, unspecified: Secondary | ICD-10-CM | POA: Diagnosis not present

## 2022-08-05 DIAGNOSIS — S99912A Unspecified injury of left ankle, initial encounter: Secondary | ICD-10-CM | POA: Diagnosis not present

## 2022-08-05 DIAGNOSIS — S99922A Unspecified injury of left foot, initial encounter: Secondary | ICD-10-CM | POA: Diagnosis not present

## 2022-08-05 DIAGNOSIS — Y9301 Activity, walking, marching and hiking: Secondary | ICD-10-CM | POA: Diagnosis not present

## 2022-08-05 DIAGNOSIS — Y929 Unspecified place or not applicable: Secondary | ICD-10-CM | POA: Diagnosis not present

## 2022-08-05 DIAGNOSIS — M7989 Other specified soft tissue disorders: Secondary | ICD-10-CM | POA: Diagnosis not present

## 2022-08-12 ENCOUNTER — Telehealth: Payer: Self-pay

## 2022-08-12 NOTE — Telephone Encounter (Signed)
Spoke to pt. She has seen ortho.

## 2022-08-12 NOTE — Telephone Encounter (Signed)
-----   Message from [name], MD sent at 08/12/2022  1:30 PM EDT ----- Please abstract tetanus she was given. Check on her also--make sure she is set up with ortho ----- Message ----- From: Pilar Grammes, CMA Sent: 08/11/2022  12:08 PM EDT To: [name], MD   ----- Message ----- From: Rosiland Oz Sent: 08/11/2022  11:45 AM EDT To: Pilar Grammes, CMA

## 2022-08-23 ENCOUNTER — Telehealth: Payer: Self-pay | Admitting: Internal Medicine

## 2022-08-23 MED ORDER — HYDROCODONE-ACETAMINOPHEN 5-325 MG PO TABS: 1.0000 | ORAL_TABLET | Freq: Four times a day (QID) | ORAL | 0 refills | Status: DC | PRN

## 2022-08-23 NOTE — Telephone Encounter (Signed)
  Encourage patient to contact the pharmacy for refills or they can request refills through [facility]  Did the patient contact the pharmacy: No  LAST APPOINTMENT DATE: 07/01/2022  NEXT APPOINTMENT DATE: 09/16/2022  MEDICATION: HYDROcodone-acetaminophen (NORCO/VICODIN) 5-325 MG tablet  Is the patient out of medication? Yes  PHARMACY: [facility], [place]  Let patient know to contact pharmacy at the end of the day to make sure medication is ready.  Please notify patient to allow 48-72 hours to process

## 2022-08-23 NOTE — Telephone Encounter (Signed)
Name of Medication: Hydrocodone [facility] of [facility] Last Fill or Written Date and Quantity: 07-15-22 #75 Last Office Visit and Type: 07-01-22 acute Next Office Visit and Type: 09-16-22 Last Controlled Substance Agreement Date: 04-21-22 Last UDS: 04-21-22

## 2022-08-25 NOTE — Telephone Encounter (Signed)
Pt called asking for an update on her med refill, has it been sent in? Call back # [phone]

## 2022-08-26 MED ORDER — HYDROCODONE-ACETAMINOPHEN 5-325 MG PO TABS: 1.0000 | ORAL_TABLET | Freq: Four times a day (QID) | ORAL | 0 refills | Status: DC | PRN

## 2022-08-26 NOTE — Addendum Note (Signed)
Addended by: Viviana Simpler I on: 08/26/2022 07:50 AM   Modules accepted: Orders

## 2022-08-30 ENCOUNTER — Encounter: Payer: Self-pay | Admitting: *Deleted

## 2022-09-16 ENCOUNTER — Encounter: Payer: Self-pay | Admitting: Internal Medicine

## 2022-09-16 ENCOUNTER — Ambulatory Visit (INDEPENDENT_AMBULATORY_CARE_PROVIDER_SITE_OTHER): Payer: 59 | Admitting: Internal Medicine

## 2022-09-16 VITALS — BP 102/70 | HR 86 | Temp 97.2°F | Ht 60.75 in | Wt 141.0 lb

## 2022-09-16 DIAGNOSIS — F1120 Opioid dependence, uncomplicated: Secondary | ICD-10-CM | POA: Diagnosis not present

## 2022-09-16 DIAGNOSIS — R69 Illness, unspecified: Secondary | ICD-10-CM | POA: Diagnosis not present

## 2022-09-16 DIAGNOSIS — G8929 Other chronic pain: Secondary | ICD-10-CM

## 2022-09-16 DIAGNOSIS — K529 Noninfective gastroenteritis and colitis, unspecified: Secondary | ICD-10-CM | POA: Insufficient documentation

## 2022-09-16 DIAGNOSIS — F3341 Major depressive disorder, recurrent, in partial remission: Secondary | ICD-10-CM | POA: Diagnosis not present

## 2022-09-16 DIAGNOSIS — M549 Dorsalgia, unspecified: Secondary | ICD-10-CM

## 2022-09-16 MED ORDER — CLONAZEPAM 0.5 MG PO TABS
0.5000 mg | ORAL_TABLET | Freq: Two times a day (BID) | ORAL | 0 refills | Status: DC | PRN
Start: 2022-09-16 — End: 2022-11-23

## 2022-09-16 MED ORDER — HYDROCODONE-ACETAMINOPHEN 5-325 MG PO TABS: 1.0000 | ORAL_TABLET | Freq: Four times a day (QID) | ORAL | 0 refills | Status: DC | PRN

## 2022-09-16 MED ORDER — ALBUTEROL SULFATE HFA 108 (90 BASE) MCG/ACT IN AERS: 2.0000 | INHALATION_SPRAY | Freq: Four times a day (QID) | RESPIRATORY_TRACT | 1 refills | Status: DC | PRN

## 2022-09-16 NOTE — Progress Notes (Signed)
Subjective:    Patient ID: Heather Russo, female    DOB: 1966-07-09, 56 y.o.   MRN: [id number]  HPI Here for follow up of chronic pain and depression Also with ongoing illness  Last week--"I thought I was going to die" (about 10 days ago) So sick--vomiting, chills Cramps in legs Diarrhea also Lost taste--still not back  GI symptoms gone now No cough or SOB Just washed out now  Having trouble sleeping---"mind just going and going" since illness Awakens with headache due to poor sleep Uses nortriptyline and clonazepam "Life---what's coming next" Worries about mom, daughter, grandkids  Depression persists No true suicidal ideation (does cross her mind)  Current Outpatient Medications on File Prior to Visit  Medication Sig Dispense Refill   albuterol (VENTOLIN HFA) 108 (90 Base) MCG/ACT inhaler Inhale 2 puffs into the lungs every 6 (six) hours as needed for wheezing or shortness of breath. Use with spacer 18 g 1   clonazePAM (KLONOPIN) 0.5 MG tablet Take 1 tablet (0.5 mg total) by mouth 2 (two) times daily as needed. 90 tablet 0   fexofenadine (ALLEGRA) 180 MG tablet Take 180 mg by mouth daily as needed.     FLUoxetine (PROZAC) 40 MG capsule Take 1 capsule by mouth once daily 90 capsule 3   gabapentin (NEURONTIN) 100 MG capsule Take 2 capsules (200 mg total) by mouth 3 (three) times daily. 540 capsule 3   HYDROcodone-acetaminophen (NORCO/VICODIN) 5-325 MG tablet Take 1 tablet by mouth every 6 (six) hours as needed for moderate pain. 75 tablet 0   nortriptyline (PAMELOR) 10 MG capsule Take 1 capsule (10 mg total) by mouth at bedtime. 90 capsule 3   pantoprazole (PROTONIX) 40 MG tablet Take 1 tablet by mouth twice daily 60 tablet 11   traZODone (DESYREL) 50 MG tablet Take 1/2-1 tab nightly 30 tablet 11   triamcinolone cream (KENALOG) 0.1 % Apply 1 application topically 2 (two) times daily as needed. 45 g 1   venlafaxine XR (EFFEXOR-XR) 75 MG 24 hr capsule Take 3 capsules (225 mg  total) by mouth daily. 90 capsule 11   No current facility-administered medications on file prior to visit.    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    Syncope and collapse    Tremor, essential     Past Surgical History:  Procedure Laterality Date   ESOPHAGOGASTRODUODENOSCOPY  05/05/06   LAPAROSCOPIC CHOLECYSTECTOMY  03/2008   wisdom teeth removeal      Family History  Problem Relation Age of Onset   Hypertension Mother    Tremor Mother    Hypertension Father    Heart attack Father    Diabetes Father    Heart Problems Father    Hypertension Brother    Diabetes Brother    Hypertension Brother  Tremor Brother    Cancer Paternal Grandmother        Lung   Colon cancer Neg Hx    Esophageal cancer Neg Hx    Pancreatic cancer Neg Hx    Rectal cancer Neg Hx    Stomach cancer Neg Hx    Breast cancer Neg Hx     Social History   Socioeconomic History   Marital status: Widowed    Spouse name: Not on file   Number of children: 1   Years of education: Not on file   Highest education level: Not on file  Occupational History   Occupation:  Inventory clerk-- laid off 2015    Comment:     Occupation: Engineer, materials    Comment: unemployed now  Tobacco Use   Smoking status: Former    Packs/day: 1.00    Types: Cigarettes    Quit date: 01/02/2018    Years since quitting: 4.7    Passive exposure: Past (as a child)   Smokeless tobacco: Never   Tobacco  comments:       Substance and Sexual Activity   Alcohol use: Yes    Alcohol/week: 0.0 standard drinks of alcohol    Comment: rare   Drug use: No   Sexual activity: Not on file  Other Topics Concern   Not on file  Social History Narrative   Widowed 10/12   Engaged with 2 year relationship--but he died Sep 14, 2023   Social Determinants of Health   Financial Resource Strain: Not on file  Food Insecurity: Not on file  Transportation Needs: Not on file  Physical Activity: Not on file  Stress: Not on file  Social Connections: Not on file  Intimate Partner Violence: Not on file   Review of Systems Lost 10# with illness---just starting to eat better    Objective:   Physical Exam Constitutional:      Appearance: Normal appearance.  Cardiovascular:     Rate and Rhythm: Normal rate and regular rhythm.     Heart sounds: No murmur heard.    No gallop.  Pulmonary:     Effort: Pulmonary effort is normal.     Breath sounds: Normal breath sounds. No wheezing or rales.  Abdominal:     Palpations: Abdomen is soft.     Tenderness: There is no abdominal tenderness.  Musculoskeletal:     Cervical back: Neck supple.     Right lower leg: No edema.     Left lower leg: No edema.  Lymphadenopathy:     Cervical: No cervical adenopathy.  Neurological:     Mental Status: She is alert.  Psychiatric:     Comments: Some psychomotor retardation Normal appearance and engagement No active SI            Assessment & Plan:

## 2022-09-16 NOTE — Assessment & Plan Note (Signed)
Has mostly resolved---but taste and appetite not yet right Lost some weight but starting to regain

## 2022-09-16 NOTE — Assessment & Plan Note (Signed)
PDMP reviewed No concerns 

## 2022-09-16 NOTE — Patient Instructions (Signed)
You may want to investigate transcranial magnetic stimulation for your depression--there is a group doing this in [place]. Ketamine is also a promising treatment--but I am not who gives this locally.

## 2022-09-16 NOTE — Assessment & Plan Note (Signed)
Seems to be worse with the depression Discussed novel Rx---TMS/ketamine----she can look into this Referral to psychiatry stilll on venlafaxine 225/fluoxetine 40

## 2022-09-16 NOTE — Assessment & Plan Note (Signed)
Uses 2-3 of the hydrocodone 5 daily Gabapentin Nortriptyline 10 at bedtime

## 2022-09-20 ENCOUNTER — Telehealth: Payer: Self-pay | Admitting: Internal Medicine

## 2022-09-20 NOTE — Telephone Encounter (Signed)
Called and spoke to pharmacy. They said it would need a PA. No request had been sent to our office nor was it in CoverMyMeds. I started the PA but the questions have not populated for me to fill out. Will check back before leaving at 5pm today. If they are not up, I will look for them in the morning.

## 2022-09-20 NOTE — Telephone Encounter (Signed)
Pt called stating she went to pharmacy to pick up meds, albuterol (VENTOLIN HFA) 108 (90 Base) MCG/ACT inhaler. Pharmacy stated pt's insurance only covered 7 days instead of 75 days. Pt was wondering is there something that can be done? Call back # [phone]

## 2022-09-20 NOTE — Telephone Encounter (Signed)
Spoke to pt. She was asking about her hydrocodone. I have not received a PA request. I wonder if they just need a dx code. Advised pt I will call the pharmacy at the end of clinic today and see what is going on.

## 2022-09-20 NOTE — Telephone Encounter (Signed)
Still no questions. I will look in the morning.

## 2022-09-22 NOTE — Telephone Encounter (Signed)
Medication prior auth approved. Details were unavailable as to the dates it is approved for. Tried to call pt to let her know but there was no VM.

## 2022-10-17 ENCOUNTER — Other Ambulatory Visit: Payer: Self-pay | Admitting: Internal Medicine

## 2022-10-18 ENCOUNTER — Telehealth: Payer: Self-pay

## 2022-10-18 ENCOUNTER — Other Ambulatory Visit (HOSPITAL_COMMUNITY): Payer: Self-pay

## 2022-10-18 NOTE — Telephone Encounter (Signed)
Received notification from [facility] regarding a prior authorization for Pantoprazole '40mg'$  tabs.   Authorization has been APPROVED.  [employer] Ellen   PA Case ID: [id number]

## 2022-10-22 ENCOUNTER — Other Ambulatory Visit: Payer: Self-pay | Admitting: Internal Medicine

## 2022-10-25 ENCOUNTER — Other Ambulatory Visit: Payer: Self-pay | Admitting: Internal Medicine

## 2022-10-25 MED ORDER — HYDROCODONE-ACETAMINOPHEN 5-325 MG PO TABS: 1.0000 | ORAL_TABLET | Freq: Four times a day (QID) | ORAL | 0 refills | Status: DC | PRN

## 2022-10-25 NOTE — Telephone Encounter (Signed)
  Encourage patient to contact the pharmacy for refills or they can request refills through [facility]  Did the patient contact the pharmacy: No  LAST APPOINTMENT DATE: 09/16/2022  NEXT APPOINTMENT DATE: 12/20/2022  MEDICATION: HYDROcodone-acetaminophen (NORCO/VICODIN) 5-325 MG tablet   Is the patient out of medication? Yes  PHARMACY: [place], [place]   Let patient know to contact pharmacy at the end of the day to make sure medication is ready.  Please notify patient to allow 48-72 hours to process

## 2022-10-25 NOTE — Telephone Encounter (Signed)
Name of Medication: Hydrocodone Name of [facility] Last Fill or Written Date and Quantity: 09-16-22 #75 Last Office Visit and Type: 09-16-22 Next Office Visit and Type: 12-20-22 Last Controlled Substance Agreement Date: 04-21-22 Last UDS: 04-21-22

## 2022-11-23 ENCOUNTER — Other Ambulatory Visit: Payer: Self-pay | Admitting: Internal Medicine

## 2022-11-23 NOTE — Telephone Encounter (Signed)
Last filled 09-16-22 #90 Last OV 09-16-22 Next OV 12-20-22 [facility]

## 2022-12-01 ENCOUNTER — Other Ambulatory Visit: Payer: Self-pay | Admitting: Internal Medicine

## 2022-12-01 NOTE — Telephone Encounter (Signed)
Name of Medication: Hydrocodone Name of Pharmacy[facility] Last Fill or Written Date and Quantity: 10-25-22 #75 Last Office Visit and Type: 09-16-22 Next Office Visit and Type: 12-20-22 Last Controlled Substance Agreement Date: 04-21-22 Last UDS: 04-21-22

## 2022-12-01 NOTE — Telephone Encounter (Signed)
..  Prescription Request  12/01/2022  Is this a "Controlled Substance" medicine? No  LOV: 09/16/2022  What is the name of the medication or equipment? HYDROcodone-acetaminophen (NORCO/VICODIN) 5-325 MG tablet   Have you contacted your pharmacy to request a refill? No   Which pharmacy would you like this sent to?  [facility], [place] - [address], [address] [place], [place] [address] Phone: [phone] Fax: [phone]    Patient notified that their request is being sent to the clinical staff for review and that they should receive a response within 2 business days.   Please advise at Mobile [phone] (mobile)

## 2022-12-02 ENCOUNTER — Telehealth: Payer: Self-pay | Admitting: Internal Medicine

## 2022-12-02 MED ORDER — HYDROCODONE-ACETAMINOPHEN 5-325 MG PO TABS: 1.0000 | ORAL_TABLET | Freq: Four times a day (QID) | ORAL | 0 refills | Status: DC | PRN

## 2022-12-02 NOTE — Telephone Encounter (Signed)
[facility] is  needing an updated diagnosis due to her being on HYDROcodone-acetaminophen (NORCO/VICODIN) 5-325 MG tablet . They said that the diagnosis that they currently have is very brief and need an update as to if she has had an recent surgeries,things of that sort.

## 2022-12-02 NOTE — Telephone Encounter (Signed)
Spoke to [facility] at [facility]. She said [facility] is requiring more information and just chronic back pain for her hydrocodone. They want surgical or procedures history. I told her that chronic back pain should be enough. I will not be giving any more history than that unless her insurance is requiring this as a prior auth situation.

## 2022-12-08 DIAGNOSIS — R69 Illness, unspecified: Secondary | ICD-10-CM | POA: Diagnosis not present

## 2022-12-08 DIAGNOSIS — F411 Generalized anxiety disorder: Secondary | ICD-10-CM | POA: Diagnosis not present

## 2022-12-08 DIAGNOSIS — F5105 Insomnia due to other mental disorder: Secondary | ICD-10-CM | POA: Diagnosis not present

## 2022-12-20 ENCOUNTER — Ambulatory Visit (INDEPENDENT_AMBULATORY_CARE_PROVIDER_SITE_OTHER): Payer: 59 | Admitting: Internal Medicine

## 2022-12-20 ENCOUNTER — Encounter: Payer: Self-pay | Admitting: Internal Medicine

## 2022-12-20 VITALS — BP 118/78 | HR 80 | Temp 97.7°F | Ht 60.75 in | Wt 140.4 lb

## 2022-12-20 DIAGNOSIS — G8929 Other chronic pain: Secondary | ICD-10-CM | POA: Diagnosis not present

## 2022-12-20 DIAGNOSIS — M5440 Lumbago with sciatica, unspecified side: Secondary | ICD-10-CM | POA: Diagnosis not present

## 2022-12-20 DIAGNOSIS — L819 Disorder of pigmentation, unspecified: Secondary | ICD-10-CM

## 2022-12-20 DIAGNOSIS — F3341 Major depressive disorder, recurrent, in partial remission: Secondary | ICD-10-CM

## 2022-12-20 DIAGNOSIS — S20212A Contusion of left front wall of thorax, initial encounter: Secondary | ICD-10-CM | POA: Diagnosis not present

## 2022-12-20 DIAGNOSIS — S298XXA Other specified injuries of thorax, initial encounter: Secondary | ICD-10-CM

## 2022-12-20 DIAGNOSIS — R69 Illness, unspecified: Secondary | ICD-10-CM | POA: Diagnosis not present

## 2022-12-20 NOTE — Assessment & Plan Note (Signed)
Will set up with derm

## 2022-12-20 NOTE — Progress Notes (Signed)
Subjective:    Patient ID: Heather Russo, female    DOB: [birth year], 57 y.o.   MRN: [id number]  HPI Here for follow up of chronic pain and depression  Things are "okay" Went to psychiatrist---Dr Nicolasa Ducking. One visit Going back tomorrow Now on citalopram---still on effexor for now Trazodone increased to 100  Ongoing chronic pain Depends on what she does---how much she stands or walks Usually the 75 lasts more than a month  Was using a hedge clipper a week ago Slipped and it hit her left chest Still having pain there--especially with a sneeze or cough  Also with a skin lesion or right upper calf Increasing in size  Current Outpatient Medications on File Prior to Visit  Medication Sig Dispense Refill   albuterol (VENTOLIN HFA) 108 (90 Base) MCG/ACT inhaler Inhale 2 puffs into the lungs every 6 (six) hours as needed for wheezing or shortness of breath. Use with spacer 18 g 1   citalopram (CELEXA) 20 MG tablet Take 20 mg by mouth daily.     fexofenadine (ALLEGRA) 180 MG tablet Take 180 mg by mouth daily as needed.     gabapentin (NEURONTIN) 100 MG capsule Take 2 capsules (200 mg total) by mouth 3 (three) times daily. 540 capsule 3   HYDROcodone-acetaminophen (NORCO/VICODIN) 5-325 MG tablet Take 1 tablet by mouth every 6 (six) hours as needed for moderate pain. 75 tablet 0   pantoprazole (PROTONIX) 40 MG tablet Take 1 tablet by mouth twice daily 60 tablet 11   traZODone (DESYREL) 100 MG tablet Take 100 mg by mouth at bedtime.     venlafaxine XR (EFFEXOR-XR) 75 MG 24 hr capsule TAKE 3 CAPSULES BY MOUTH ONCE DAILY 90 capsule 3   No current facility-administered medications on file prior to visit.    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    Syncope and collapse    Tremor, essential     Past Surgical History:  Procedure Laterality Date   ESOPHAGOGASTRODUODENOSCOPY  05/05/06   LAPAROSCOPIC CHOLECYSTECTOMY  03/2008   wisdom teeth removeal      Family History  Problem Relation Age of Onset   Hypertension Mother    Tremor Mother    Hypertension Father    Heart attack Father    Diabetes Father    Heart Problems Father    Hypertension Brother    Diabetes Brother    Hypertension Brother    Tremor Brother    Cancer Paternal Grandmother        Lung   Colon cancer Neg Hx    Esophageal cancer Neg Hx    Pancreatic cancer Neg Hx    Rectal cancer Neg Hx    Stomach cancer Neg Hx    Breast cancer Neg Hx     Social History  Socioeconomic History   Marital status: Widowed    Spouse name: Not on file   Number of children: 1   Years of education: Not on file   Highest education level: Not on file  Occupational History   Occupation:  Inventory clerk-- laid off 2015    Comment:     Occupation: Engineer, materials    Comment: unemployed now  Tobacco Use   Smoking status: Former    Packs/day: 1.00    Types: Cigarettes    Quit date: 01/02/2018    Years since quitting: 4.9    Passive exposure: Past (as a child)   Smokeless tobacco: Never   Tobacco comments:       Substance and Sexual Activity   Alcohol use: Yes    Alcohol/week: 0.0 standard drinks of alcohol    Comment: rare   Drug use: No   Sexual activity: Not on file  Other Topics Concern   Not on file  Social History Narrative   Widowed 10/12   Engaged with 2 year relationship--but he died Sep 26, 2023   Social Determinants of Health   Financial Resource Strain: Not on file  Food  Insecurity: Not on file  Transportation Needs: Not on file  Physical Activity: Not on file  Stress: Not on file  Social Connections: Not on file  Intimate Partner Violence: Not on file   Review of Systems Headaches seem to be worsening Still on the gabapentin     Objective:   Physical Exam Constitutional:      Appearance: Normal appearance.  Cardiovascular:     Rate and Rhythm: Normal rate and regular rhythm.     Heart sounds: No murmur heard.    No gallop.  Pulmonary:     Effort: Pulmonary effort is normal.     Breath sounds: Normal breath sounds. No wheezing or rales.     Comments: Tender over left ribs  ~T4 over breast Musculoskeletal:     Cervical back: Neck supple.  Lymphadenopathy:     Cervical: No cervical adenopathy.  Skin:    Comments: Tender nodule ~10 mm on right upper calf  Neurological:     Mental Status: She is alert.  Psychiatric:        Mood and Affect: Mood normal.        Behavior: Behavior normal.            Assessment & Plan:

## 2022-12-20 NOTE — Assessment & Plan Note (Signed)
Has been chronic  Now seeing Dr Nicolasa Ducking Now on citalopram--still venlafaxine (but may be weaning) Trazodone for sleep

## 2022-12-20 NOTE — Assessment & Plan Note (Signed)
Ongoing pain No loss of function Takes the hydrocodone twice a day on average and occ 3

## 2022-12-20 NOTE — Assessment & Plan Note (Signed)
Reassured This should resolve on its own--but may take a few weeks

## 2022-12-22 DIAGNOSIS — F5105 Insomnia due to other mental disorder: Secondary | ICD-10-CM | POA: Diagnosis not present

## 2022-12-22 DIAGNOSIS — R69 Illness, unspecified: Secondary | ICD-10-CM | POA: Diagnosis not present

## 2022-12-22 DIAGNOSIS — F411 Generalized anxiety disorder: Secondary | ICD-10-CM | POA: Diagnosis not present

## 2023-01-04 ENCOUNTER — Other Ambulatory Visit: Payer: Self-pay | Admitting: Internal Medicine

## 2023-01-04 MED ORDER — HYDROCODONE-ACETAMINOPHEN 5-325 MG PO TABS: 1.0000 | ORAL_TABLET | Freq: Four times a day (QID) | ORAL | 0 refills | Status: DC | PRN

## 2023-01-04 NOTE — Telephone Encounter (Signed)
Prescription Request  01/04/2023  Is this a "Controlled Substance" medicine? Yes  LOV: 12/20/2022  What is the name of the medication or equipment? HYDROcodone-acetaminophen (NORCO/VICODIN) 5-325 MG tablet   Have you contacted your pharmacy to request a refill? No   Which pharmacy would you like this sent to?  [place], [place] - [address] [place], [place] [address] Phone: [phone] Fax: [phone]    Patient notified that their request is being sent to the clinical staff for review and that they should receive a response within 2 business days.   Please advise at Mobile [phone] (mobile)

## 2023-01-04 NOTE — Telephone Encounter (Signed)
Name of Medication: Hydrocodone [facility] Last Fill or Written Date and Quantity: 12-02-22 #75 Last Office Visit and Type: 12-20-22 Next Office Visit and Type: 03-21-23 Last Controlled Substance Agreement Date: 04-21-22 Last UDS: 04-21-22

## 2023-01-05 ENCOUNTER — Encounter: Payer: Self-pay | Admitting: Dermatology

## 2023-01-05 ENCOUNTER — Ambulatory Visit (INDEPENDENT_AMBULATORY_CARE_PROVIDER_SITE_OTHER): Payer: 59 | Admitting: Dermatology

## 2023-01-05 ENCOUNTER — Other Ambulatory Visit: Payer: Self-pay | Admitting: Dermatology

## 2023-01-05 VITALS — BP 114/79 | HR 82

## 2023-01-05 DIAGNOSIS — C44722 Squamous cell carcinoma of skin of right lower limb, including hip: Secondary | ICD-10-CM | POA: Diagnosis not present

## 2023-01-05 DIAGNOSIS — Z85828 Personal history of other malignant neoplasm of skin: Secondary | ICD-10-CM | POA: Diagnosis not present

## 2023-01-05 DIAGNOSIS — L578 Other skin changes due to chronic exposure to nonionizing radiation: Secondary | ICD-10-CM

## 2023-01-05 DIAGNOSIS — D492 Neoplasm of unspecified behavior of bone, soft tissue, and skin: Secondary | ICD-10-CM

## 2023-01-05 DIAGNOSIS — C4492 Squamous cell carcinoma of skin, unspecified: Secondary | ICD-10-CM

## 2023-01-05 HISTORY — DX: Squamous cell carcinoma of skin, unspecified: C44.92

## 2023-01-05 NOTE — Patient Instructions (Signed)
Wound Care Instructions  Cleanse wound gently with soap and water once a day then pat dry with clean gauze. Apply a thin coat of Petrolatum (petroleum jelly, "Vaseline") over the wound (unless you have an allergy to this). We recommend that you use a new, sterile tube of Vaseline. Do not pick or remove scabs. Do not remove the yellow or white "healing tissue" from the base of the wound.  Cover the wound with fresh, clean, nonstick gauze and secure with paper tape. You may use Band-Aids in place of gauze and tape if the wound is small enough, but would recommend trimming much of the tape off as there is often too much. Sometimes Band-Aids can irritate the skin.  You should call the office for your biopsy report after 1 week if you have not already been contacted.  If you experience any problems, such as abnormal amounts of bleeding, swelling, significant bruising, significant pain, or evidence of infection, please call the office immediately.  FOR ADULT SURGERY PATIENTS: If you need something for pain relief you may take 1 extra strength Tylenol (acetaminophen) AND 2 Ibuprofen (200mg each) together every 4 hours as needed for pain. (do not take these if you are allergic to them or if you have a reason you should not take them.) Typically, you may only need pain medication for 1 to 3 days.     Due to recent changes in healthcare laws, you may see results of your pathology and/or laboratory studies on MyChart before the doctors have had a chance to review them. We understand that in some cases there may be results that are confusing or concerning to you. Please understand that not all results are received at the same time and often the doctors may need to interpret multiple results in order to provide you with the best plan of care or course of treatment. Therefore, we ask that you please give us 2 business days to thoroughly review all your results before contacting the office for clarification. Should  we see a critical lab result, you will be contacted sooner.   If You Need Anything After Your Visit  If you have any questions or concerns for your doctor, please call our main line at [phone] and press option 4 to reach your doctor's medical assistant. If no one answers, please leave a voicemail as directed and we will return your call as soon as possible. Messages left after 4 pm will be answered the following business day.   You may also send us a message via MyChart. We typically respond to MyChart messages within 1-2 business days.  For prescription refills, please ask your pharmacy to contact our office. Our fax number is [phone].  If you have an urgent issue when the clinic is closed that cannot wait until the next business day, you can page your doctor at the number below.    Please note that while we do our best to be available for urgent issues outside of office hours, we are not available 24/7.   If you have an urgent issue and are unable to reach us, you may choose to seek medical care at your doctor's office, retail clinic, urgent care center, or emergency room.  If you have a medical emergency, please immediately call 911 or go to the emergency department.  Pager Numbers  - Dr. Kowalski: [phone]  - Dr. Moye: [phone]  - Dr. Stewart: [phone]  In the event of inclement weather, please call our main line at   [phone] for an update on the status of any delays or closures.  Dermatology Medication Tips: Please keep the boxes that topical medications come in in order to help keep track of the instructions about where and how to use these. Pharmacies typically print the medication instructions only on the boxes and not directly on the medication tubes.   If your medication is too expensive, please contact our office at [phone] option 4 or send us a message through MyChart.   We are unable to tell what your co-pay for medications will be in  advance as this is different depending on your insurance coverage. However, we may be able to find a substitute medication at lower cost or fill out paperwork to get insurance to cover a needed medication.   If a prior authorization is required to get your medication covered by your insurance company, please allow us 1-2 business days to complete this process.  Drug prices often vary depending on where the prescription is filled and some pharmacies may offer cheaper prices.  The website www.goodrx.com contains coupons for medications through different pharmacies. The prices here do not account for what the cost may be with help from insurance (it may be cheaper with your insurance), but the website can give you the price if you did not use any insurance.  - You can print the associated coupon and take it with your prescription to the pharmacy.  - You may also stop by our office during regular business hours and pick up a GoodRx coupon card.  - If you need your prescription sent electronically to a different pharmacy, notify our office through [facility] MyChart or by phone at [phone] option 4.     Si Usted Necesita Algo Despus de Su Visita  Tambin puede enviarnos un mensaje a travs de MyChart. Por lo general respondemos a los mensajes de MyChart en el transcurso de 1 a 2 das hbiles.  Para renovar recetas, por favor pida a su farmacia que se ponga en contacto con nuestra oficina. Nuestro nmero de fax es el [phone].  Si tiene un asunto urgente cuando la clnica est cerrada y que no puede esperar hasta el siguiente da hbil, puede llamar/localizar a su doctor(a) al nmero que aparece a continuacin.   Por favor, tenga en cuenta que aunque hacemos todo lo posible para estar disponibles para asuntos urgentes fuera del horario de oficina, no estamos disponibles las 24 horas del da, los 7 das de la semana.   Si tiene un problema urgente y no puede comunicarse con nosotros, puede  optar por buscar atencin mdica  en el consultorio de su doctor(a), en una clnica privada, en un centro de atencin urgente o en una sala de emergencias.  Si tiene una emergencia mdica, por favor llame inmediatamente al 911 o vaya a la sala de emergencias.  Nmeros de bper  - Dr. Kowalski: [phone]  - Dra. Moye: [phone]  - Dra. Stewart: [phone]  En caso de inclemencias del tiempo, por favor llame a nuestra lnea principal al [phone] para una actualizacin sobre el estado de cualquier retraso o cierre.  Consejos para la medicacin en dermatologa: Por favor, guarde las cajas en las que vienen los medicamentos de uso tpico para ayudarle a seguir las instrucciones sobre dnde y cmo usarlos. Las farmacias generalmente imprimen las instrucciones del medicamento slo en las cajas y no directamente en los tubos del medicamento.   Si su medicamento es muy caro, por favor, pngase en contacto con   nuestra oficina llamando al [phone] y presione la opcin 4 o envenos un mensaje a travs de MyChart.   No podemos decirle cul ser su copago por los medicamentos por adelantado ya que esto es diferente dependiendo de la cobertura de su seguro. Sin embargo, es posible que podamos encontrar un medicamento sustituto a menor costo o llenar un formulario para que el seguro cubra el medicamento que se considera necesario.   Si se requiere una autorizacin previa para que su compaa de seguros cubra su medicamento, por favor permtanos de 1 a 2 das hbiles para completar este proceso.  Los precios de los medicamentos varan con frecuencia dependiendo del lugar de dnde se surte la receta y alguna farmacias pueden ofrecer precios ms baratos.  El sitio web www.goodrx.com tiene cupones para medicamentos de diferentes farmacias. Los precios aqu no tienen en cuenta lo que podra costar con la ayuda del seguro (puede ser ms barato con su seguro), pero el sitio web puede darle el  precio si no utiliz ningn seguro.  - Puede imprimir el cupn correspondiente y llevarlo con su receta a la farmacia.  - Tambin puede pasar por nuestra oficina durante el horario de atencin regular y recoger una tarjeta de cupones de GoodRx.  - Si necesita que su receta se enve electrnicamente a una farmacia diferente, informe a nuestra oficina a travs de MyChart de [facility] o por telfono llamando al [phone] y presione la opcin 4.  

## 2023-01-05 NOTE — Progress Notes (Signed)
   New Patient Visit  Subjective  Heather Russo is a 57 y.o. female who presents for the following: Skin Problem (The patient has a spot to be evaluated on her right leg, new and changing and the patient has concerns that these could be cancer. ). Patient report she has a history of skin cancer.  She is outside a lot.    The following portions of the chart were reviewed this encounter and updated as appropriate:       Review of Systems:  No other skin or systemic complaints except as noted in HPI or Assessment and Plan.  Objective  Well appearing patient in no apparent distress; mood and affect are within normal limits.  A focused examination was performed including left leg. Relevant physical exam findings are noted in the Assessment and Plan.  right medial lower leg 1.2 cm pink brown scaly papule with heme crusting          Assessment & Plan  Neoplasm of skin right medial lower leg  Epidermal / dermal shaving  Lesion diameter (cm):  1.2 Informed consent: discussed and consent obtained   Timeout: patient name, date of birth, surgical site, and procedure verified   Procedure prep:  Patient was prepped and draped in usual sterile fashion Prep type:  Isopropyl alcohol Anesthesia: the lesion was anesthetized in a standard fashion   Anesthetic:  1% lidocaine w/ epinephrine 1-100,000 buffered w/ 8.4% NaHCO3 Instrument used: flexible razor blade   Hemostasis achieved with: pressure, aluminum chloride and electrodesiccation   Outcome: patient tolerated procedure well   Post-procedure details: sterile dressing applied and wound care instructions given   Dressing type: bandage and petrolatum    Specimen 1 - Surgical pathology Differential Diagnosis: Inflamed ISK R/O SCC   Check Margins: No  Inflamed ISK R/O SCC  Actinic Damage - chronic, secondary to cumulative UV radiation exposure/sun exposure over time - diffuse scaly erythematous macules with underlying  dyspigmentation - Recommend daily broad spectrum sunscreen SPF 30+ to sun-exposed areas, reapply every 2 hours as needed.  - Recommend staying in the shade or wearing long sleeves, sun glasses (UVA+UVB protection) and wide brim hats (4-inch brim around the entire circumference of the hat). - Call for new or changing lesions.   History of Skin Cancer   Clear. Observe for recurrence.  Call clinic for new or changing lesions.   Recommend regular skin exams, daily broad-spectrum spf 30+ sunscreen use, and photoprotection.      Return for f/u pending biopsy results, recommend TBSE at next office visit .   I, Marye Round, CMA, am acting as scribe for Brendolyn Patty, MD .   Documentation: I have reviewed the above documentation for accuracy and completeness, and I agree with the above.  Brendolyn Patty MD

## 2023-01-10 ENCOUNTER — Telehealth: Payer: Self-pay

## 2023-01-10 NOTE — Telephone Encounter (Signed)
Advised pt of bx results and scheduled pt for EDC./sh

## 2023-01-10 NOTE — Telephone Encounter (Signed)
-----   Message from Brendolyn Patty, MD sent at 01/10/2023  5:01 PM EST ----- Skin , right medial lower leg WELL DIFFERENTIATED SQUAMOUS CELL CARCINOMA   SCC skin cancer, needs EDC  - please call patient

## 2023-01-26 ENCOUNTER — Ambulatory Visit: Payer: 59 | Admitting: Dermatology

## 2023-01-26 ENCOUNTER — Telehealth: Payer: Self-pay

## 2023-01-26 NOTE — Telephone Encounter (Signed)
Called patient to rescheduled missed appt from today. No answer. Unable to leave message.

## 2023-02-01 ENCOUNTER — Other Ambulatory Visit: Payer: Self-pay | Admitting: Internal Medicine

## 2023-02-01 NOTE — Telephone Encounter (Signed)
Prescription Request  02/01/2023  LOV: 12/20/2022  What is the name of the medication or equipment? venlafaxine XR (EFFEXOR-XR) 75 MG 24 hr capsule    traZODone (DESYREL) 100 MG tablet   HYDROcodone-acetaminophen (NORCO/VICODIN) 5-325 MG tablet   Have you contacted your pharmacy to request a refill? Yes   Which pharmacy would you like this sent to?  [facility], [place] - [address] [place], [place] [address] Phone: [phone] Fax: [phone]    Patient notified that their request is being sent to the clinical staff for review and that they should receive a response within 2 business days.   Please advise at Mobile [phone] (mobile)

## 2023-02-02 MED ORDER — VENLAFAXINE HCL ER 75 MG PO CP24: 225.0000 mg | ORAL_CAPSULE | Freq: Every day | ORAL | 3 refills | Status: DC

## 2023-02-02 MED ORDER — HYDROCODONE-ACETAMINOPHEN 5-325 MG PO TABS: 1.0000 | ORAL_TABLET | Freq: Four times a day (QID) | ORAL | 0 refills | Status: DC | PRN

## 2023-02-02 MED ORDER — TRAZODONE HCL 100 MG PO TABS
100.0000 mg | ORAL_TABLET | Freq: Every day | ORAL | 3 refills | Status: AC
Start: 2023-02-02 — End: ?

## 2023-02-02 MED ORDER — PANTOPRAZOLE SODIUM 40 MG PO TBEC: 40.0000 mg | DELAYED_RELEASE_TABLET | Freq: Two times a day (BID) | ORAL | 3 refills | Status: DC

## 2023-02-02 NOTE — Telephone Encounter (Signed)
Name of Medication: Hydrocodone Name of Pharmacy: [facility] Last Fill or Written Date and Quantity: 01-04-23 #75 Last Office Visit and Type: 12-20-22 Next Office Visit and Type: 03-21-23 Last Controlled Substance Agreement Date: 04-21-22 Last UDS: 04-21-22

## 2023-02-08 ENCOUNTER — Telehealth: Payer: Self-pay | Admitting: Internal Medicine

## 2023-02-08 NOTE — Telephone Encounter (Signed)
Patient called in stating that she now has [place] as her insurance and they are saying that she need PA on all of her medication in order for them to be filled. Could this be looked into please.

## 2023-02-11 ENCOUNTER — Telehealth: Payer: Self-pay

## 2023-02-11 ENCOUNTER — Other Ambulatory Visit (HOSPITAL_COMMUNITY): Payer: Self-pay

## 2023-02-11 NOTE — Telephone Encounter (Signed)
Pharmacy Patient Advocate Encounter   Received notification that prior authorization for Pantoprazole 40mg  tabs is required/requested.  Per Test Claim: Prior authorization required   PA submitted on 02/11/23 to (ins) [employer] [place]  via CoverMyMeds Key  # [id number] Status is pending

## 2023-02-11 NOTE — Telephone Encounter (Signed)
Pharmacy Patient Advocate Encounter   Received notification that prior authorization for Hydrocodone/APAP 5/325mg  is required/requested.  Per Test Claim: Prior authorization is required   PA submitted on 02/11/23 to (ins) [employer] [place] [employer] via [employer] # [id number] Status is pending

## 2023-02-15 ENCOUNTER — Other Ambulatory Visit (HOSPITAL_COMMUNITY): Payer: Self-pay

## 2023-02-15 NOTE — Telephone Encounter (Addendum)
PA needs to be resubmitted as she has tried and failed omeprazole and esomeprazole (2012).

## 2023-02-15 NOTE — Telephone Encounter (Signed)
Thank you :)

## 2023-02-15 NOTE — Telephone Encounter (Signed)
Received a fax regarding Prior Authorization from [facility] of [place] for Pantoprazole 40mg .  Authorization has been DENIED because the request does not meet the definition of medical necessity found in the member's benefit booklet. This medication is covered when 2 otc generic proton pump inhibitors (such as omeprazole, lansoprazole) have been tried and did not work, or when the member cannot take all other OTC generic proton pump inhibitor medications.  Phone# [phone]  Denial letter attached to chart  Please be advised we currently do not have a pharmacist to review denials, therefore you will need to process appeals accordingly as needed. Thanks for your support at this time.

## 2023-02-15 NOTE — Telephone Encounter (Signed)
Patient Advocate Encounter  Prior Authorization for Hydrocodone/APAP 5/325mg  has been approved.    PA# [id number] Effective dates: 02/11/23 through 03/13/23  Approval letter attached to chart

## 2023-02-15 NOTE — Telephone Encounter (Signed)
Submitted an appeal with updated information via CoverMyMeds.

## 2023-03-15 ENCOUNTER — Other Ambulatory Visit (HOSPITAL_COMMUNITY): Payer: Self-pay

## 2023-03-15 NOTE — Telephone Encounter (Signed)
Ran test claim on one medication from charts. Heather Russo is no longer active, patient has [employer]. Please advise which medication needs prior authorization.

## 2023-03-21 ENCOUNTER — Encounter: Payer: Self-pay | Admitting: Internal Medicine

## 2023-03-22 ENCOUNTER — Ambulatory Visit: Payer: 59 | Admitting: Dermatology

## 2023-03-22 ENCOUNTER — Encounter: Payer: Self-pay | Admitting: Internal Medicine

## 2023-03-22 ENCOUNTER — Telehealth: Payer: Self-pay

## 2023-03-22 ENCOUNTER — Other Ambulatory Visit: Payer: Self-pay | Admitting: Internal Medicine

## 2023-03-22 MED ORDER — HYDROCODONE-ACETAMINOPHEN 5-325 MG PO TABS: 1.0000 | ORAL_TABLET | Freq: Four times a day (QID) | ORAL | 0 refills | Status: DC | PRN

## 2023-03-22 NOTE — Telephone Encounter (Signed)
Prescription Request  03/22/2023  LOV: 12/20/2022  What is the name of the medication or equipment? HYDROcodone-acetaminophen (NORCO/VICODIN) 5-325 MG tablet   Have you contacted your pharmacy to request a refill? No   Which pharmacy would you like this sent to?  [facility] [address], [place] - [address], [address] [place] [address] Phone: [phone] Fax: [phone]    Patient notified that their request is being sent to the clinical staff for review and that they should receive a response within 2 business days.   Please advise at Mobile [phone] (mobile)

## 2023-03-22 NOTE — Telephone Encounter (Signed)
Tried to call patient regarding missed appointment today. Patient has an SCC on the right medial lower leg that needs to be treated with EDC.

## 2023-03-22 NOTE — Telephone Encounter (Signed)
Please have her reschedule appt

## 2023-03-22 NOTE — Telephone Encounter (Signed)
Name of Medication: Hydrocodone Name of Pharmacy: [facility] Last Fill or Written Date and Quantity: 02-02-23 #75 Last Office Visit and Type: 12-20-22 Next Office Visit and Type: 03-21-23 No Show Last Controlled Substance Agreement Date: 04-21-22 Last UDS: 04-21-22

## 2023-03-23 NOTE — Telephone Encounter (Signed)
Called patient not able to lvm

## 2023-04-19 ENCOUNTER — Other Ambulatory Visit: Payer: Self-pay | Admitting: Internal Medicine

## 2023-04-19 MED ORDER — HYDROCODONE-ACETAMINOPHEN 5-325 MG PO TABS
1.0000 | ORAL_TABLET | Freq: Four times a day (QID) | ORAL | 0 refills | Status: DC | PRN
Start: 2023-04-19 — End: 2023-05-24

## 2023-04-19 NOTE — Telephone Encounter (Signed)
Prescription Request  04/19/2023  LOV: 12/20/2022  What is the name of the medication or equipment? HYDROcodone-acetaminophen (NORCO/VICODIN) 5-325 MG tablet   Have you contacted your pharmacy to request a refill? No   Which pharmacy would you like this sent to?  [facility] [address] [place] - [address], [address] [place] [address] Phone: [phone] Fax: [phone]    Patient notified that their request is being sent to the clinical staff for review and that they should receive a response within 2 business days.   Please advise at Mobile [phone] (mobile)

## 2023-04-19 NOTE — Telephone Encounter (Signed)
Name of Medication: Hydrocodone Name of Pharmacy: [facility] Last Fill or Written Date and Quantity: 03-22-23 #75 Last Office Visit and Type: 12-20-22 Next Office Visit and Type: 04-21-23 Last Controlled Substance Agreement Date: 04-21-22 Last UDS: 04-21-22

## 2023-04-21 ENCOUNTER — Ambulatory Visit (INDEPENDENT_AMBULATORY_CARE_PROVIDER_SITE_OTHER): Payer: BLUE CROSS/BLUE SHIELD | Admitting: Internal Medicine

## 2023-04-21 ENCOUNTER — Encounter: Payer: Self-pay | Admitting: Internal Medicine

## 2023-04-21 VITALS — BP 110/70 | HR 58 | Temp 97.6°F | Ht 61.0 in | Wt 135.0 lb

## 2023-04-21 DIAGNOSIS — I10 Essential (primary) hypertension: Secondary | ICD-10-CM | POA: Diagnosis not present

## 2023-04-21 DIAGNOSIS — Z0000 Encounter for general adult medical examination without abnormal findings: Secondary | ICD-10-CM | POA: Diagnosis not present

## 2023-04-21 DIAGNOSIS — Z23 Encounter for immunization: Secondary | ICD-10-CM

## 2023-04-21 DIAGNOSIS — M549 Dorsalgia, unspecified: Secondary | ICD-10-CM

## 2023-04-21 DIAGNOSIS — F3341 Major depressive disorder, recurrent, in partial remission: Secondary | ICD-10-CM

## 2023-04-21 DIAGNOSIS — R739 Hyperglycemia, unspecified: Secondary | ICD-10-CM | POA: Diagnosis not present

## 2023-04-21 DIAGNOSIS — K219 Gastro-esophageal reflux disease without esophagitis: Secondary | ICD-10-CM

## 2023-04-21 DIAGNOSIS — G8929 Other chronic pain: Secondary | ICD-10-CM

## 2023-04-21 LAB — CBC
HCT: 43 % (ref 36.0–46.0)
Hemoglobin: 14.1 g/dL (ref 12.0–15.0)
MCHC: 32.9 g/dL (ref 30.0–36.0)
MCV: 90.7 fl (ref 78.0–100.0)
Platelets: 296 10*3/uL (ref 150.0–400.0)
RBC: 4.74 Mil/uL (ref 3.87–5.11)
RDW: 13.6 % (ref 11.5–15.5)
WBC: 13.1 10*3/uL — ABNORMAL HIGH (ref 4.0–10.5)

## 2023-04-21 LAB — COMPREHENSIVE METABOLIC PANEL
ALT: 12 U/L (ref 0–35)
AST: 16 U/L (ref 0–37)
Albumin: 4.1 g/dL (ref 3.5–5.2)
Alkaline Phosphatase: 86 U/L (ref 39–117)
BUN: 15 mg/dL (ref 6–23)
CO2: 31 mEq/L (ref 19–32)
Calcium: 9.2 mg/dL (ref 8.4–10.5)
Chloride: 105 mEq/L (ref 96–112)
Creatinine, Ser: 1.12 mg/dL (ref 0.40–1.20)
GFR: 54.57 mL/min — ABNORMAL LOW (ref >60.00)
Glucose, Bld: 87 mg/dL (ref 70–99)
Potassium: 4.7 mEq/L (ref 3.5–5.1)
Sodium: 141 mEq/L (ref 135–145)
Total Bilirubin: 0.4 mg/dL (ref 0.2–1.2)
Total Protein: 6.7 g/dL (ref 6.0–8.3)

## 2023-04-21 LAB — LIPID PANEL
Cholesterol: 242 mg/dL — ABNORMAL HIGH (ref 0–200)
HDL: 39.4 mg/dL (ref >39.00)
LDL Cholesterol: 176 mg/dL — ABNORMAL HIGH (ref 0–99)
NonHDL: 202.9
Total CHOL/HDL Ratio: 6
Triglycerides: 133 mg/dL (ref 0.0–149.0)
VLDL: 26.6 mg/dL (ref 0.0–40.0)

## 2023-04-21 LAB — HEMOGLOBIN A1C: Hgb A1c MFr Bld: 5.6 % (ref 4.6–6.5)

## 2023-04-21 MED ORDER — PANTOPRAZOLE SODIUM 40 MG PO TBEC: 40.0000 mg | DELAYED_RELEASE_TABLET | Freq: Two times a day (BID) | ORAL | 3 refills | Status: DC

## 2023-04-21 NOTE — Addendum Note (Signed)
Addended by: Eual Fines on: 04/21/2023 12:53 PM   Modules accepted: Orders

## 2023-04-21 NOTE — Assessment & Plan Note (Signed)
Urged her to stop smoking again She needs to set her screening mammogram Colon/pap both due in 2027 Will take shingrix Flu vaccine in the fall Prefers no COVID vaccine

## 2023-04-21 NOTE — Patient Instructions (Signed)
Please set up your screening mammogram. PLEASE STOP SMOKING!!

## 2023-04-21 NOTE — Assessment & Plan Note (Signed)
BP Readings from Last 3 Encounters:  04/21/23 110/70  01/05/23 114/79  12/20/22 118/78   No Rx and running low now If recurrent spells where she falls, will set up with cardiology

## 2023-04-21 NOTE — Assessment & Plan Note (Signed)
Will refill the protonix

## 2023-04-21 NOTE — Addendum Note (Signed)
Addended by: Eual Fines on: 04/21/2023 12:34 PM   Modules accepted: Orders

## 2023-04-21 NOTE — Assessment & Plan Note (Signed)
Still on hydrocodone and gabapentin

## 2023-04-21 NOTE — Progress Notes (Signed)
Subjective:    Patient ID: Heather Russo, female    DOB: 1966-08-21, 57 y.o.   MRN: [id number]  HPI Here for physical  Not doing great--but nothing new Has had 2 falls in the past week Checked her BP and it was quite low--but then came up some after Was at a birthday party--did not feel right. Daughter checked sugar--168  Seeing Dr Maryruth Bun for depression Recent changes that hasn't really helped No suicidal ideation  Current Outpatient Medications on File Prior to Visit  Medication Sig Dispense Refill   albuterol (VENTOLIN HFA) 108 (90 Base) MCG/ACT inhaler Inhale 2 puffs into the lungs every 6 (six) hours as needed for wheezing or shortness of breath. Use with spacer 18 g 1   citalopram (CELEXA) 20 MG tablet Take 20 mg by mouth daily.     fexofenadine (ALLEGRA) 180 MG tablet Take 180 mg by mouth daily as needed.     gabapentin (NEURONTIN) 100 MG capsule Take 2 capsules (200 mg total) by mouth 3 (three) times daily. 540 capsule 3   HYDROcodone-acetaminophen (NORCO/VICODIN) 5-325 MG tablet Take 1 tablet by mouth every 6 (six) hours as needed for moderate pain. 75 tablet 0   pantoprazole (PROTONIX) 40 MG tablet Take 1 tablet (40 mg total) by mouth 2 (two) times daily. 180 tablet 3   traZODone (DESYREL) 100 MG tablet Take 1 tablet (100 mg total) by mouth at bedtime. 90 tablet 3   venlafaxine XR (EFFEXOR-XR) 75 MG 24 hr capsule Take 3 capsules (225 mg total) by mouth daily. 270 capsule 3   No current facility-administered medications on file prior to visit.    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 03/21/2022 Tried to call to reschedule, no answer and not able to leave message.   Syncope and collapse    Tremor, essential     Past Surgical History:  Procedure Laterality Date   ESOPHAGOGASTRODUODENOSCOPY  05/05/06   LAPAROSCOPIC CHOLECYSTECTOMY  03/2008   wisdom teeth removeal      Family History  Problem Relation Age of Onset   Hypertension Mother    Tremor Mother    Hypertension Father    Heart attack Father    Diabetes Father    Heart Problems Father    Hypertension Brother    Diabetes Brother    Hypertension Brother    Tremor Brother    Cancer Paternal Grandmother        Lung   Colon cancer Neg Hx    Esophageal cancer Neg Hx    Pancreatic cancer Neg Hx    Rectal cancer Neg Hx    Stomach cancer  Neg Hx    Breast cancer Neg Hx     Social History   Socioeconomic History   Marital status: Widowed    Spouse name: Not on file   Number of children: 1   Years of education: Not on file   Highest education level: Not on file  Occupational History   Occupation:  Inventory clerk-- laid off 2015    Comment:     Occupation: Therapist, sports    Comment: unemployed now  Tobacco Use   Smoking status: Former    Packs/day: 1    Types: Cigarettes    Quit date: 01/02/2018    Years since quitting: 5.3    Passive exposure: Past (as a child)   Smokeless tobacco: Never   Tobacco comments:       Substance and Sexual Activity   Alcohol use: Yes    Alcohol/week: 0.0 standard drinks of alcohol    Comment: rare   Drug use: No   Sexual activity: Not on file  Other Topics Concern   Not on file  Social History Narrative   Widowed 10/12   Engaged with 2 year relationship--but he died September 15, 2023   Social Determinants of  Health   Financial Resource Strain: Not on file  Food Insecurity: Not on file  Transportation Needs: Not on file  Physical Activity: Not on file  Stress: Not on file  Social Connections: Not on file  Intimate Partner Violence: Not on file   Review of Systems  Constitutional:        Not eating right Has lost some weight Wears seat belt  HENT:  Positive for dental problem and tinnitus. Negative for hearing loss and trouble swallowing.        Needs some dental extractions  Eyes:  Negative for visual disturbance.       No diplopia or unilateral vision loss  Respiratory:  Negative for cough, chest tightness and shortness of breath.        Back to smoking but not as many as in the past Uses it to calm herself  Cardiovascular:  Negative for chest pain and palpitations.       Feet swell if up all day  Gastrointestinal:  Negative for blood in stool and constipation.       No heartburn if takes protonix  Endocrine: Positive for polydipsia. Negative for polyuria.  Genitourinary:  Negative for dyspareunia, dysuria and hematuria.  Musculoskeletal:  Positive for arthralgias and back pain. Negative for joint swelling.  Skin:        No suspicious skin lesions  Allergic/Immunologic: Positive for environmental allergies. Negative for immunocompromised state.       Some eye swelling--uses allegra  Neurological:        Rare headaches Falls--but not syncope  Hematological:  Negative for adenopathy. Does not bruise/bleed easily.  Psychiatric/Behavioral:  Positive for dysphoric mood. Negative for sleep disturbance. The patient is nervous/anxious.        Objective:   Physical Exam Constitutional:      Appearance: Normal appearance.  HENT:     Mouth/Throat:     Pharynx: No oropharyngeal exudate or posterior oropharyngeal erythema.  Eyes:     Conjunctiva/sclera: Conjunctivae normal.     Pupils: Pupils are equal, round, and reactive to light.  Cardiovascular:     Rate and Rhythm: Normal rate  and regular rhythm.     Pulses: Normal pulses.     Heart sounds: No murmur heard.    No gallop.  Pulmonary:  Effort: Pulmonary effort is normal.     Breath sounds: Normal breath sounds. No wheezing or rales.  Abdominal:     Palpations: Abdomen is soft.     Tenderness: There is no abdominal tenderness.  Musculoskeletal:     Cervical back: Neck supple.     Right lower leg: No edema.     Left lower leg: No edema.  Lymphadenopathy:     Cervical: No cervical adenopathy.  Skin:    Findings: No rash.  Neurological:     General: No focal deficit present.     Mental Status: She is alert and oriented to person, place, and time.  Psychiatric:        Mood and Affect: Mood normal.        Behavior: Behavior normal.            Assessment & Plan:

## 2023-04-21 NOTE — Assessment & Plan Note (Signed)
Working with Dr Maryruth Bun

## 2023-04-24 LAB — DRUG MONITORING, PANEL 8 WITH CONFIRMATION, URINE
6 Acetylmorphine: NEGATIVE ng/mL (ref <10)
Alcohol Metabolites: NEGATIVE ng/mL (ref <500)
Amphetamine: NEGATIVE ng/mL (ref <250)
Amphetamines: NEGATIVE ng/mL (ref <500)
Benzodiazepines: NEGATIVE ng/mL (ref <100)
Buprenorphine, Urine: NEGATIVE ng/mL (ref <5)
Cocaine Metabolite: NEGATIVE ng/mL (ref <150)
Codeine: NEGATIVE ng/mL (ref <50)
Creatinine: 231.4 mg/dL (ref >=20.0)
Hydrocodone: 753 ng/mL — ABNORMAL HIGH (ref <50)
Hydromorphone: 102 ng/mL — ABNORMAL HIGH (ref <50)
MDMA: NEGATIVE ng/mL (ref <500)
Marijuana Metabolite: 3721 ng/mL — ABNORMAL HIGH (ref <5)
Marijuana Metabolite: POSITIVE ng/mL — AB (ref <20)
Methamphetamine: NEGATIVE ng/mL (ref <250)
Morphine: NEGATIVE ng/mL (ref <50)
Norhydrocodone: 1313 ng/mL — ABNORMAL HIGH (ref <50)
Opiates: POSITIVE ng/mL — AB (ref <100)
Oxidant: NEGATIVE ug/mL (ref <200)
Oxycodone: NEGATIVE ng/mL (ref <100)
pH: 5.4 (ref 4.5–9.0)

## 2023-04-24 LAB — DM TEMPLATE

## 2023-04-28 ENCOUNTER — Telehealth: Payer: Self-pay | Admitting: Internal Medicine

## 2023-04-28 NOTE — Telephone Encounter (Signed)
Prescription Request  04/28/2023  LOV: 04/21/2023  What is the name of the medication or equipment? HYDROcodone-acetaminophen (NORCO/VICODIN) 5-325 MG tablet   Have you contacted your pharmacy to request a refill? No   Which pharmacy would you like this sent to?  [facility] [address] [place] - [address], [address] [place] [address] Phone: [phone] Fax: [phone]    Patient notified that their request is being sent to the clinical staff for review and that they should receive a response within 2 business days.   Please advise at Mobile [phone] (mobile)

## 2023-04-28 NOTE — Telephone Encounter (Signed)
Tried to call pt, no answer and no VM set up. Hydrocodone was sent in 04-19-23 #75.

## 2023-04-28 NOTE — Telephone Encounter (Signed)
pantoprazole (PROTONIX) 40 MG tablet   Patient is needing prior authorization for this medication, please advise.

## 2023-04-29 ENCOUNTER — Other Ambulatory Visit (HOSPITAL_COMMUNITY): Payer: Self-pay

## 2023-04-29 NOTE — Telephone Encounter (Signed)
Cover MyMeds Key: [id number] PA initiated. Waiting on questions to answer.

## 2023-05-03 ENCOUNTER — Other Ambulatory Visit (HOSPITAL_COMMUNITY): Payer: Self-pay

## 2023-05-03 ENCOUNTER — Telehealth: Payer: Self-pay

## 2023-05-03 NOTE — Telephone Encounter (Signed)
Pharmacy Patient Advocate Encounter   Received notification that prior authorization for Pantoprazole Sodium 40MG  dr tablets is required/requested.   PA initiated to [facility] via CoverMyMeds Key or (Medicaid) confirmation # [id number]  Waiting on questions to populate.

## 2023-05-03 NOTE — Telephone Encounter (Signed)
Answered questions  PA submitted

## 2023-05-03 NOTE — Telephone Encounter (Signed)
Resubmitted PA via CMM. Created new encounter. Will route back to pool once determination has been made.

## 2023-05-04 NOTE — Telephone Encounter (Signed)
Pantoprazole approved with [employer]. Effective:04/29/23-04/28/24

## 2023-05-20 ENCOUNTER — Other Ambulatory Visit: Payer: Self-pay | Admitting: Internal Medicine

## 2023-05-20 NOTE — Telephone Encounter (Signed)
Prescription Request  05/20/2023  LOV: 04/21/2023  What is the name of the medication or equipment? HYDROcodone-acetaminophen (NORCO/VICODIN) 5-325 MG tablet   Have you contacted your pharmacy to request a refill? No   Which pharmacy would you like this sent to?  [facility] [address], [place] - [address], [address] [place] [address] Phone: [phone] Fax: [phone]    Patient notified that their request is being sent to the clinical staff for review and that they should receive a response within 2 business days.   Please advise at Mobile [phone] (mobile)

## 2023-05-23 NOTE — Telephone Encounter (Signed)
Patient contacted the office again regarding this request, states she is out of this medication. Advised Dr. Alphonsus Sias is out of office this week and I would send a message again to provider filling in for him to see if Heather Russo can be refilled. Please advise, thank you.

## 2023-05-23 NOTE — Telephone Encounter (Signed)
Patient called back to check on the status of this rx being sent in for her. She stated that she is out of medication,and is in pain.

## 2023-05-24 MED ORDER — HYDROCODONE-ACETAMINOPHEN 5-325 MG PO TABS: 1.0000 | ORAL_TABLET | Freq: Four times a day (QID) | ORAL | 0 refills | Status: DC | PRN

## 2023-05-24 NOTE — Telephone Encounter (Signed)
See prev note.  Last filled on 04/19/23 #75 tabs/ 0 refill  CPE was on 04/21/23 and next year CPE is already scheduled

## 2023-05-24 NOTE — Addendum Note (Signed)
Addended by: Shon Millet on: 05/24/2023 10:13 AM   Modules accepted: Orders

## 2023-05-24 NOTE — Telephone Encounter (Signed)
Patient called in and stated that she picked up  the ones for June on May 30,and they have already ran out,and now she is needing the ones for July.

## 2023-05-24 NOTE — Telephone Encounter (Signed)
Worthy Rancher sent the rx today

## 2023-06-16 ENCOUNTER — Other Ambulatory Visit: Payer: Self-pay | Admitting: Internal Medicine

## 2023-06-23 ENCOUNTER — Other Ambulatory Visit: Payer: Self-pay | Admitting: Internal Medicine

## 2023-06-23 MED ORDER — HYDROCODONE-ACETAMINOPHEN 5-325 MG PO TABS: 1.0000 | ORAL_TABLET | Freq: Four times a day (QID) | ORAL | 0 refills | Status: DC | PRN

## 2023-06-23 NOTE — Telephone Encounter (Signed)
Name of Medication: Hydrocodone [facility] of [facility]: [facility] Last Fill or Written Date and Quantity: 05-24-23 #75 Last Office Visit and Type: 04-21-23 Next Office Visit and Type: 04-23-24 Last Controlled Substance Agreement Date: 04-21-23 Last UDS: 04-21-23

## 2023-06-23 NOTE — Telephone Encounter (Signed)
Prescription Request  06/23/2023  LOV: 04/21/2023  What is the name of the medication or equipment? HYDROcodone-acetaminophen (NORCO/VICODIN) 5-325 MG tablet   Have you contacted your pharmacy to request a refill? No   Which pharmacy would you like this sent to?  [facility] [address], [place] - [address], [address] [place] [address] Phone: [phone] Fax: [phone]    Patient notified that their request is being sent to the clinical staff for review and that they should receive a response within 2 business days.   Please advise at Mobile [phone] (mobile)

## 2023-07-07 ENCOUNTER — Encounter (INDEPENDENT_AMBULATORY_CARE_PROVIDER_SITE_OTHER): Payer: Self-pay

## 2023-07-20 ENCOUNTER — Other Ambulatory Visit: Payer: Self-pay | Admitting: Internal Medicine

## 2023-07-21 MED ORDER — HYDROCODONE-ACETAMINOPHEN 5-325 MG PO TABS: 1.0000 | ORAL_TABLET | Freq: Four times a day (QID) | ORAL | 0 refills | Status: DC | PRN

## 2023-07-21 NOTE — Telephone Encounter (Signed)
Not able to lvm for patient.

## 2023-07-21 NOTE — Telephone Encounter (Signed)
Name of Medication: Hydrocodone Name of Pharmacy: [facility] Last Fill or Written Date and Quantity: 06-23-23 #75 Last Office Visit and Type: 04-21-23 Next Office Visit and Type: 04-23-24 Last Controlled Substance Agreement Date: 04-21-23 Last UDS: 04-21-23

## 2023-07-21 NOTE — Telephone Encounter (Signed)
Sent mychart message

## 2023-08-01 ENCOUNTER — Ambulatory Visit: Payer: BLUE CROSS/BLUE SHIELD | Admitting: Dermatology

## 2023-08-09 ENCOUNTER — Ambulatory Visit: Payer: BLUE CROSS/BLUE SHIELD

## 2023-08-18 ENCOUNTER — Ambulatory Visit: Payer: BLUE CROSS/BLUE SHIELD | Admitting: Internal Medicine

## 2023-08-18 ENCOUNTER — Other Ambulatory Visit: Payer: Self-pay | Admitting: Internal Medicine

## 2023-08-18 MED ORDER — HYDROCODONE-ACETAMINOPHEN 5-325 MG PO TABS: 1.0000 | ORAL_TABLET | Freq: Four times a day (QID) | ORAL | 0 refills | Status: DC | PRN

## 2023-08-18 NOTE — Telephone Encounter (Signed)
Name of Medication: Hydrocodone Name of [facility]acy: [facility] Last Fill or Written Date and Quantity: 07-21-23 #75 Last Office Visit and Type: 04-21-23 Next Office Visit and Type: 08-22-24 Last Controlled Substance Agreement Date: 04-21-23 Last UDS: 04-21-23

## 2023-08-18 NOTE — Telephone Encounter (Signed)
Prescription Request  08/18/2023  LOV: 04/21/2023  What is the name of the medication or equipment? HYDROcodone-acetaminophen (NORCO/VICODIN) 5-325 MG tablet   Have you contacted your pharmacy to request a refill? No   Which pharmacy would you like this sent to?  [facility] [address], [place] - [address] [place] [address] Phone: [phone] Fax: [phone]    Patient notified that their request is being sent to the clinical staff for review and that they should receive a response within 2 business days.   Please advise at [facility] [phone]

## 2023-08-23 ENCOUNTER — Encounter: Payer: Self-pay | Admitting: Internal Medicine

## 2023-08-23 ENCOUNTER — Ambulatory Visit (INDEPENDENT_AMBULATORY_CARE_PROVIDER_SITE_OTHER): Payer: BLUE CROSS/BLUE SHIELD | Admitting: Internal Medicine

## 2023-08-23 VITALS — BP 122/80 | HR 71 | Temp 98.4°F | Ht 61.0 in | Wt 129.0 lb

## 2023-08-23 DIAGNOSIS — F1120 Opioid dependence, uncomplicated: Secondary | ICD-10-CM | POA: Diagnosis not present

## 2023-08-23 DIAGNOSIS — M5489 Other dorsalgia: Secondary | ICD-10-CM

## 2023-08-23 DIAGNOSIS — F3341 Major depressive disorder, recurrent, in partial remission: Secondary | ICD-10-CM

## 2023-08-23 DIAGNOSIS — G8929 Other chronic pain: Secondary | ICD-10-CM

## 2023-08-23 DIAGNOSIS — Z23 Encounter for immunization: Secondary | ICD-10-CM | POA: Diagnosis not present

## 2023-08-23 MED ORDER — CLONAZEPAM 0.5 MG PO TABS: 0.5000 mg | ORAL_TABLET | Freq: Two times a day (BID) | ORAL | 0 refills | Status: DC | PRN

## 2023-08-23 NOTE — Progress Notes (Signed)
Subjective:    Patient ID: Heather Russo, female    DOB: [birth year], 57 y.o.   MRN: [id number]  HPI Here for follow up of chronic pain and narcotic dependence  Not doing great Having mood issues ---feels she needs the clonazepam again Tried citalopram through Dr Laurena Bering it because she didn't like the way she felt Has stopped seeing her Some depression daily, but no suicidal ideation or thoughts Clonazepam seems to help the depression also  Back pain is about the same Now having some right hip pain Still uses the hydrocodone 2-3 times a day  Current Outpatient Medications on File Prior to Visit  Medication Sig Dispense Refill   albuterol (VENTOLIN HFA) 108 (90 Base) MCG/ACT inhaler Inhale 2 puffs into the lungs every 6 (six) hours as needed for wheezing or shortness of breath. Use with spacer 18 g 1   fexofenadine (ALLEGRA) 180 MG tablet Take 180 mg by mouth daily as needed.     gabapentin (NEURONTIN) 100 MG capsule TAKE 2 CAPSULES BY MOUTH THREE TIMES DAILY 180 capsule 3   HYDROcodone-acetaminophen (NORCO/VICODIN) 5-325 MG tablet Take 1 tablet by mouth every 6 (six) hours as needed for moderate pain. 75 tablet 0   pantoprazole (PROTONIX) 40 MG tablet Take 1 tablet (40 mg total) by mouth 2 (two) times daily. 180 each 3   traZODone (DESYREL) 100 MG tablet Take 1 tablet (100 mg total) by mouth at bedtime. 90 tablet 3   venlafaxine XR (EFFEXOR-XR) 75 MG 24 hr capsule Take 3 capsules (225 mg total) by mouth daily. 270 capsule 3   No current facility-administered medications on file prior to visit.    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 03/21/2022 Tried to call to reschedule, no answer and not able to leave message.   Syncope and collapse    Tremor, essential     Past Surgical History:  Procedure Laterality Date   ESOPHAGOGASTRODUODENOSCOPY  05/05/06   LAPAROSCOPIC CHOLECYSTECTOMY  03/2008   wisdom teeth removeal      Family History  Problem Relation Age of Onset   Hypertension Mother    Tremor Mother    Hypertension Father    Heart attack Father    Diabetes Father    Heart Problems Father    Hypertension Brother    Diabetes Brother    Hypertension Brother    Tremor Brother    Cancer Paternal Grandmother        Lung   Colon cancer Neg Hx    Esophageal cancer Neg Hx    Pancreatic cancer Neg Hx    Rectal cancer Neg Hx  Stomach cancer Neg Hx    Breast cancer Neg Hx     Social History   Socioeconomic History   Marital status: Widowed    Spouse name: Not on file   Number of children: 1   Years of education: Not on file   Highest education level: Not on file  Occupational History   Occupation:  Inventory clerk-- laid off 2015    Comment:     Occupation: Therapist, sports    Comment: unemployed now  Tobacco Use   Smoking status: Every Day    Current packs/day: 0.00    Types: Cigarettes    Last attempt to quit: 01/02/2018    Years since quitting: 5.6    Passive exposure: Past (as a child)   Smokeless tobacco: Never   Tobacco comments:       Substance and Sexual Activity   Alcohol use: Yes    Alcohol/week: 0.0 standard drinks of alcohol    Comment: rare   Drug use: No   Sexual activity: Not on file  Other Topics Concern   Not on file  Social History Narrative   Widowed 10/12    Engaged with 2 year relationship--but he died 2023/10/06   Social Determinants of Health   Financial Resource Strain: Not on file  Food Insecurity: Not on file  Transportation Needs: Not on file  Physical Activity: Not on file  Stress: Not on file  Social Connections: Not on file  Intimate Partner Violence: Not on file   Review of Systems Sleeps with trazodone--but trouble staying asleep without clonazepam Has lost some weight--relates to the mood issues     Objective:   Physical Exam Constitutional:      Appearance: Normal appearance.  Neurological:     Mental Status: She is alert.  Psychiatric:     Comments: Mild depression            Assessment & Plan:

## 2023-08-23 NOTE — Assessment & Plan Note (Signed)
Ongoing issues Uses the hydrocodone 2-3 times a day

## 2023-08-23 NOTE — Assessment & Plan Note (Signed)
PDMP reviewed No concerns 

## 2023-08-23 NOTE — Assessment & Plan Note (Signed)
Did poorly with the citalopram On velnafaxine 225 daily Will add back clonazepam 0.5 bid

## 2023-09-17 ENCOUNTER — Other Ambulatory Visit: Payer: Self-pay | Admitting: Internal Medicine

## 2023-09-19 MED ORDER — HYDROCODONE-ACETAMINOPHEN 5-325 MG PO TABS: 1.0000 | ORAL_TABLET | Freq: Four times a day (QID) | ORAL | 0 refills | Status: DC | PRN

## 2023-09-19 NOTE — Telephone Encounter (Signed)
Last OV: 08/23/2023 Pending OV: 09/27/2023 Medication Hydrocodone 5/325 Directions: Take one tablet q6h prn Last Refill: 08/18/2023 Qty: #75 with 0 refills

## 2023-09-27 ENCOUNTER — Ambulatory Visit (INDEPENDENT_AMBULATORY_CARE_PROVIDER_SITE_OTHER): Payer: BLUE CROSS/BLUE SHIELD | Admitting: Internal Medicine

## 2023-09-27 ENCOUNTER — Encounter: Payer: Self-pay | Admitting: Internal Medicine

## 2023-09-27 VITALS — BP 120/74 | HR 87 | Temp 98.5°F | Ht 61.0 in | Wt 130.0 lb

## 2023-09-27 DIAGNOSIS — F3341 Major depressive disorder, recurrent, in partial remission: Secondary | ICD-10-CM

## 2023-09-27 DIAGNOSIS — M7551 Bursitis of right shoulder: Secondary | ICD-10-CM | POA: Insufficient documentation

## 2023-09-27 MED ORDER — CLONAZEPAM 1 MG PO TABS: 0.5000 mg | ORAL_TABLET | Freq: Two times a day (BID) | ORAL | 0 refills | Status: DC | PRN

## 2023-09-27 NOTE — Progress Notes (Signed)
Subjective:    Patient ID: Heather Russo, female    DOB: June 21, 1966, 57 y.o.   MRN: [id number]  HPI Here for follow up of depression and chronic back pain  She feels her mood is a little better briefly after taking the clonazepam---but it wears off after 2-3 hours Wonders about the dosage Chronic depression---but "anxiety is out the roof sometimes" Trouble getting out of bed at times Thinks about dying at times---no suicidal ideation  Has knot behind right ear--goes back a while  Right shoulder pain--goes back a while  Right hip is also bad Trouble walking at times Pain in bed also  Current Outpatient Medications on File Prior to Visit  Medication Sig Dispense Refill   albuterol (VENTOLIN HFA) 108 (90 Base) MCG/ACT inhaler Inhale 2 puffs into the lungs every 6 (six) hours as needed for wheezing or shortness of breath. Use with spacer 18 g 1   clonazePAM (KLONOPIN) 0.5 MG tablet Take 1 tablet (0.5 mg total) by mouth 2 (two) times daily as needed for anxiety. 60 tablet 0   fexofenadine (ALLEGRA) 180 MG tablet Take 180 mg by mouth daily as needed.     gabapentin (NEURONTIN) 100 MG capsule TAKE 2 CAPSULES BY MOUTH THREE TIMES DAILY 180 capsule 3   HYDROcodone-acetaminophen (NORCO/VICODIN) 5-325 MG tablet Take 1 tablet by mouth every 6 (six) hours as needed for moderate pain (pain score 4-6). 75 tablet 0   pantoprazole (PROTONIX) 40 MG tablet Take 1 tablet (40 mg total) by mouth 2 (two) times daily. 180 each 3   traZODone (DESYREL) 100 MG tablet Take 1 tablet (100 mg total) by mouth at bedtime. 90 tablet 3   venlafaxine XR (EFFEXOR-XR) 75 MG 24 hr capsule Take 3 capsules (225 mg total) by mouth daily. 270 capsule 3   No current facility-administered medications on file prior to visit.    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 03/21/2022 Tried to call to reschedule, no answer and not able to leave message.   Syncope and collapse    Tremor, essential     Past Surgical History:  Procedure Laterality Date   ESOPHAGOGASTRODUODENOSCOPY  05/05/06   LAPAROSCOPIC CHOLECYSTECTOMY  03/2008   wisdom teeth removeal      Family History  Problem Relation Age of Onset   Hypertension Mother    Tremor Mother    Hypertension Father    Heart attack Father    Diabetes Father    Heart Problems Father    Hypertension Brother    Diabetes Brother    Hypertension Brother    Tremor Brother    Cancer Paternal Grandmother  Lung   Colon cancer Neg Hx    Esophageal cancer Neg Hx    Pancreatic cancer Neg Hx    Rectal cancer Neg Hx    Stomach cancer Neg Hx    Breast cancer Neg Hx     Social History   Socioeconomic History   Marital status: Widowed    Spouse name: Not on file   Number of children: 1   Years of education: Not on file   Highest education level: Not on file  Occupational History   Occupation:  Inventory clerk-- laid off 2015    Comment:     Occupation: Therapist, sports    Comment: unemployed now  Tobacco Use   Smoking status: Every Day    Current packs/day: 0.00    Types: Cigarettes    Last attempt to quit: 01/02/2018    Years since quitting: 5.7    Passive exposure: Past (as a child)   Smokeless tobacco: Never   Tobacco comments:       Substance and Sexual Activity   Alcohol use: Yes    Alcohol/week:  0.0 standard drinks of alcohol    Comment: rare   Drug use: No   Sexual activity: Not on file  Other Topics Concern   Not on file  Social History Narrative   Widowed 10/12   Engaged with 2 year relationship--but he died 2023/09/19   Social Determinants of Health   Financial Resource Strain: Not on file  Food Insecurity: Not on file  Transportation Needs: Not on file  Physical Activity: Not on file  Stress: Not on file  Social Connections: Not on file  Intimate Partner Violence: Not on file   Review of Systems Initiates sleep okay--but has night awakening Appetite is not great---eats one good meal a day Significant weight loss in the past year--but stable recently     Objective:   Physical Exam Musculoskeletal:     Comments: Right shoulder ROM fairly good Localized tenderness over subacromial bursa  Fairly normal ROM in right hip Some localized bursa tenderness there also  Lymphadenopathy:     Comments: Small post auricular node on right--doesn't seem pathologic  Psychiatric:     Comments: Mild depressed mood Normal speech and appearance            Assessment & Plan:

## 2023-09-27 NOTE — Assessment & Plan Note (Signed)
Ongoing depression---but troubled also by the anxiety Discussed options---she is interested in trying higher clonazepam dose (since it helps but wears off) Discussed concerns with narcotic, dependence, etc I mentioned considering duloxetine----but she isn't excited about that  Will go ahead and increase the clonazepam to 1mg  bid (that would be the limit)

## 2023-09-27 NOTE — Assessment & Plan Note (Addendum)
No signs of sig internal derangement Discussed using topical diclofenac and ice Consider cortisone injection  Also bursitis in right hip Same Rx for that Reassured--no sig internal findings

## 2023-10-08 ENCOUNTER — Other Ambulatory Visit: Payer: Self-pay | Admitting: Internal Medicine

## 2023-10-26 ENCOUNTER — Other Ambulatory Visit: Payer: Self-pay | Admitting: Internal Medicine

## 2023-10-26 MED ORDER — HYDROCODONE-ACETAMINOPHEN 5-325 MG PO TABS: 1.0000 | ORAL_TABLET | Freq: Four times a day (QID) | ORAL | 0 refills | Status: DC | PRN

## 2023-10-26 NOTE — Telephone Encounter (Signed)
Name of Medication: Hydrocodone [facility] of [facility] Last Fill or Written Date and Quantity: 09-19-23 #75 Last Office Visit and Type: 09-27-23 Next Office Visit and Type: 12-28-23 Last Controlled Substance Agreement Date: 04-21-23 Last UDS: 04-21-23

## 2023-10-26 NOTE — Telephone Encounter (Signed)
Prescription Request  10/26/2023  LOV: 09/27/2023  What is the name of the medication or equipment? HYDROcodone-acetaminophen (NORCO/VICODIN) 5-325 MG tablet    Have you contacted your pharmacy to request a refill? No   Which pharmacy would you like this sent to?  [facility] [address] [place] - [address] [place] [address] Phone: [phone] Fax: [phone]    Patient notified that their request is being sent to the clinical staff for review and that they should receive a response within 2 business days.   Please advise at Mobile [phone] (mobile)

## 2023-11-27 ENCOUNTER — Other Ambulatory Visit: Payer: Self-pay | Admitting: Internal Medicine

## 2023-11-28 MED ORDER — HYDROCODONE-ACETAMINOPHEN 5-325 MG PO TABS: 1.0000 | ORAL_TABLET | Freq: Four times a day (QID) | ORAL | 0 refills | Status: DC | PRN

## 2023-11-28 MED ORDER — CLONAZEPAM 1 MG PO TABS: 0.5000 mg | ORAL_TABLET | Freq: Two times a day (BID) | ORAL | 0 refills | Status: DC | PRN

## 2023-11-28 NOTE — Telephone Encounter (Signed)
 Refill request for HYDROcodone-acetaminophen (NORCO/VICODIN) 5-325 MG tablet and   clonazePAM (KLONOPIN) 1 MG tablet    LOV - 09/27/23 Next OV - 12/28/23 Last refill - Clonazepam 09/27/23 #60/0                   Hydrocodone 10/26/23 #75/0

## 2023-12-07 ENCOUNTER — Telehealth: Payer: Self-pay | Admitting: Internal Medicine

## 2023-12-14 ENCOUNTER — Other Ambulatory Visit: Payer: Self-pay | Admitting: Internal Medicine

## 2023-12-14 NOTE — Telephone Encounter (Signed)
Copied from CRM [id number]. Topic: Clinical - Medication Refill >> Dec 14, 2023 12:38 PM Theodis Sato wrote: Most Recent Primary Care Visit:  Provider: Tillman Abide I  Department: [facility]  Visit Type: OFFICE VISIT  Date: 09/27/2023  Medication: HYDROcodone-acetaminophen (NORCO/VICODIN) 5-325 MG tablet  Has the patient contacted their pharmacy? Yes needs a prior authorization (Agent: If no, request that the patient contact the pharmacy for the refill. If patient does not wish to contact the pharmacy document the reason why and proceed with request.) (Agent: If yes, when and what did the pharmacy advise?)  Is this the correct pharmacy for this prescription? Yes If no, delete pharmacy and type the correct one.  This is the patient's preferred pharmacy:  [facility] [address] [place] - [address], [address] [place] [address] Phone: [phone] Fax: [phone]   Has the prescription been filled recently? Yes  Is the patient out of the medication? Yes  Has the patient been seen for an appointment in the last year OR does the patient have an upcoming appointment? Yes  Can we respond through MyChart? Yes  Agent: Please be advised that Rx refills may take up to 3 business days. We ask that you follow-up with your pharmacy.

## 2023-12-16 NOTE — Telephone Encounter (Signed)
Copied from CRM [id number]. Topic: Clinical - Prescription Issue >> Dec 16, 2023 12:34 PM Corin V wrote: Reason for CRM: Patient recently changed insurance and is needing a prior authorization completed to get her hydrocodone Rx approved for a 30 day refill. Please complete prior authorization.

## 2023-12-17 ENCOUNTER — Other Ambulatory Visit: Payer: Self-pay | Admitting: Internal Medicine

## 2023-12-19 ENCOUNTER — Other Ambulatory Visit (HOSPITAL_COMMUNITY): Payer: Self-pay

## 2023-12-21 ENCOUNTER — Other Ambulatory Visit: Payer: Self-pay | Admitting: Internal Medicine

## 2023-12-21 NOTE — Telephone Encounter (Signed)
Copied from CRM [id number]. Topic: Clinical - Medication Refill >> Dec 21, 2023  4:53 PM Hector Shade B wrote: Most Recent Primary Care Visit:  Provider: Tillman Abide I  Department: [facility]  Visit Type: OFFICE VISIT  Date: 09/27/2023  Medication: HYDROcodone-acetaminophen (NORCO/VICODIN) 5-325 MG tablet  Has the patient contacted their pharmacy? Yes (Agent: If no, request that the patient contact the pharmacy for the refill. If patient does not wish to contact the pharmacy document the reason why and proceed with request.) (Agent: If yes, when and what did the pharmacy advise?)Told patient to call office to request refill   Is this the correct pharmacy for this prescription? Yes If no, delete pharmacy and type the correct one.  This is the patient's preferred pharmacy:  [facility] [address] [place] - [address] [place] [address] Phone: [phone] Fax: [phone]   Has the prescription been filled recently? Yes  Is the patient out of the medication? Yes  Has the patient been seen for an appointment in the last year OR does the patient have an upcoming appointment? Yes  Can we respond through MyChart? Yes  Agent: Please be advised that Rx refills may take up to 3 business days. We ask that you follow-up with your pharmacy.

## 2023-12-22 NOTE — Telephone Encounter (Signed)
Called and spoke to pt. She has new insurance and we do not have the card on file. Advised her that she could attach a copy of the card in a MyChart message. Also, we have not received a PA request form the pharmacy. She will see what she can do about getting [place] the card.

## 2023-12-23 ENCOUNTER — Other Ambulatory Visit: Payer: Self-pay

## 2023-12-23 ENCOUNTER — Other Ambulatory Visit (HOSPITAL_COMMUNITY): Payer: Self-pay

## 2023-12-23 MED ORDER — HYDROCODONE-ACETAMINOPHEN 5-325 MG PO TABS: 1.0000 | ORAL_TABLET | Freq: Four times a day (QID) | ORAL | 0 refills | Status: DC | PRN

## 2023-12-23 NOTE — Telephone Encounter (Signed)
She does need a new rx since they only gave her a week's supply and required PA.

## 2023-12-23 NOTE — Telephone Encounter (Signed)
*  Primary  Pharmacy Patient Advocate Encounter   Received notification from RX Request Messages that prior authorization for HYDROcodone-Acetaminophen 5-325MG  tablets  is required/requested.   Insurance verification completed.   The patient is insured through [facility] .   Per test claim: PA required; PA submitted to above mentioned insurance via CoverMyMeds Key/confirmation #/EOC [id number] Status is pending

## 2023-12-26 MED ORDER — HYDROCODONE-ACETAMINOPHEN 5-325 MG PO TABS: 1.0000 | ORAL_TABLET | Freq: Four times a day (QID) | ORAL | 0 refills | Status: DC | PRN

## 2023-12-26 NOTE — Telephone Encounter (Signed)
Called and spoke to pt. Looked at [employer] and found that [facility] has it for $12.78 She asked that a new rx be sent to [facility] [place] with a note that she is using a Good Rx card.

## 2023-12-26 NOTE — Telephone Encounter (Signed)
PA request has been Denied. New Encounter created for follow up. For additional info see Pharmacy Prior Auth telephone encounter from 12/23/2023.

## 2023-12-26 NOTE — Telephone Encounter (Signed)
Copied from CRM [id number]. Topic: Clinical - Prescription Issue >> Dec 26, 2023 11:30 AM Tiffany H wrote: Reason for CRM: Patient called to follow up on request for Vicodin refill. Advised that prior authorization for Vicodin was denied. Please assist.   Patient advised that she's currently in pain. Patient has been out of her medication for one week. Patient advised that her pain level is at an 8. Pease assist.

## 2023-12-26 NOTE — Telephone Encounter (Signed)
Pharmacy Patient Advocate Encounter  Received notification from [facility] that Prior Authorization for HYDROcodone-Acetaminophen 5-325MG  tablets has been DENIED.  No reason given; No denial letter received via Fax or CMM. It has been requested and will be uploaded to the media tab once received.   PA #/Case ID/Reference #: [id number]

## 2023-12-28 ENCOUNTER — Ambulatory Visit: Payer: No Typology Code available for payment source | Admitting: Internal Medicine

## 2023-12-28 ENCOUNTER — Ambulatory Visit: Payer: 59 | Admitting: Dermatology

## 2023-12-28 ENCOUNTER — Encounter: Payer: Self-pay | Admitting: Internal Medicine

## 2023-12-28 VITALS — BP 100/70 | HR 86 | Temp 98.3°F | Ht 61.0 in | Wt 132.0 lb

## 2023-12-28 DIAGNOSIS — F1120 Opioid dependence, uncomplicated: Secondary | ICD-10-CM

## 2023-12-28 DIAGNOSIS — F39 Unspecified mood [affective] disorder: Secondary | ICD-10-CM

## 2023-12-28 DIAGNOSIS — M545 Low back pain, unspecified: Secondary | ICD-10-CM

## 2023-12-28 DIAGNOSIS — G8929 Other chronic pain: Secondary | ICD-10-CM

## 2023-12-28 NOTE — Assessment & Plan Note (Signed)
 Chronic anxiety and some dysthymia/grieving Only feels clonazepam  helps

## 2023-12-28 NOTE — Assessment & Plan Note (Signed)
Discussed options---will refer to physiatry for evaluation----in hopes of decreasing her narcotic needs and improving function Still on the hydrocodone for now

## 2023-12-28 NOTE — Assessment & Plan Note (Signed)
PDMP reviewed No surprises

## 2023-12-28 NOTE — Progress Notes (Addendum)
 Subjective:    Patient ID: Heather Russo, female    DOB: 03/31/1966, 58 y.o.   MRN: [id number]  HPI Here for review of chronic pain and narcotic dependence  Insurance didn't cover her hydrocodone  Was able to fill this with [facility]  Does find the anxiety is better with the higher clonazepam  Mom is disabled--she has to bring her everywhere she has to go Still grieving dad who died 2 years ago--afraid of using mom  Has ongoing back pain No surgery or pain evaluation in the past  Current Outpatient Medications on File Prior to Visit  Medication Sig Dispense Refill   albuterol  (VENTOLIN  HFA) 108 (90 Base) MCG/ACT inhaler Inhale 2 puffs into the lungs every 6 (six) hours as needed for wheezing or shortness of breath. Use with spacer 18 g 1   clonazePAM  (KLONOPIN ) 1 MG tablet Take 0.5-1 tablets (0.5-1 mg total) by mouth 2 (two) times daily as needed for anxiety. 60 tablet 0   fexofenadine (ALLEGRA) 180 MG tablet Take 180 mg by mouth daily as needed.     gabapentin  (NEURONTIN ) 100 MG capsule TAKE 2 CAPSULES BY MOUTH THREE TIMES DAILY 180 capsule 11   HYDROcodone -acetaminophen  (NORCO/VICODIN) 5-325 MG tablet Take 1 tablet by mouth every 6 (six) hours as needed for moderate pain (pain score 4-6). 75 tablet 0   traZODone  (DESYREL ) 100 MG tablet Take 1 tablet (100 mg total) by mouth at bedtime. 90 tablet 3   venlafaxine  XR (EFFEXOR -XR) 75 MG 24 hr capsule Take 3 capsules (225 mg total) by mouth daily. 270 capsule 3   No current facility-administered medications on file prior to visit.    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 03/21/2022 Tried to call to reschedule, no answer and not able to leave message.   Syncope and collapse    Tremor, essential     Past Surgical History:  Procedure Laterality Date   ESOPHAGOGASTRODUODENOSCOPY  05/05/06   LAPAROSCOPIC CHOLECYSTECTOMY  03/2008   wisdom teeth removeal      Family History  Problem Relation Age of Onset   Hypertension Mother    Tremor Mother    Hypertension Father    Heart attack Father    Diabetes Father    Heart Problems Father    Hypertension Brother    Diabetes Brother    Hypertension Brother    Tremor Brother    Cancer Paternal Grandmother        Lung   Colon cancer Neg Hx    Esophageal cancer Neg Hx    Pancreatic cancer Neg Hx    Rectal cancer Neg Hx  Stomach cancer Neg Hx    Breast cancer Neg Hx     Social History   Socioeconomic History   Marital status: Widowed    Spouse name: Not on file   Number of children: 1   Years of education: Not on file   Highest education level: Not on file  Occupational History   Occupation:  Inventory clerk-- laid off 2015    Comment:     Occupation: Therapist, sports    Comment: unemployed now  Tobacco Use   Smoking status: Every Day    Current packs/day: 0.00    Types: Cigarettes    Last attempt to quit: 01/02/2018    Years since quitting: 5.9    Passive exposure: Past (as a child)   Smokeless tobacco: Never   Tobacco comments:       Substance and Sexual Activity   Alcohol use: Yes    Alcohol/week: 0.0 standard drinks of alcohol    Comment: rare   Drug use: No   Sexual activity: Not on file  Other Topics Concern   Not on file  Social History Narrative   Widowed 10/12   Engaged with 2 year  relationship--but he died 30-Sep-2024   Social Drivers of Health   Financial Resource Strain: Not on file  Food Insecurity: Not on file  Transportation Needs: Not on file  Physical Activity: Not on file  Stress: Not on file  Social Connections: Not on file  Intimate Partner Violence: Not on file   Review of Systems Notes decreased libido--but no dyspareunia. Discussed this    Objective:   Physical Exam Constitutional:      Appearance: Normal appearance.  Neurological:     Mental Status: She is alert.            Assessment & Plan:

## 2023-12-30 ENCOUNTER — Other Ambulatory Visit: Payer: Self-pay | Admitting: Internal Medicine

## 2023-12-31 MED ORDER — CLONAZEPAM 1 MG PO TABS: 0.5000 mg | ORAL_TABLET | Freq: Two times a day (BID) | ORAL | 0 refills | Status: DC | PRN

## 2024-01-03 ENCOUNTER — Encounter: Payer: Self-pay | Admitting: Internal Medicine

## 2024-01-12 ENCOUNTER — Encounter: Payer: Self-pay | Admitting: *Deleted

## 2024-01-29 ENCOUNTER — Other Ambulatory Visit: Payer: Self-pay | Admitting: Internal Medicine

## 2024-01-30 MED ORDER — HYDROCODONE-ACETAMINOPHEN 5-325 MG PO TABS: 1.0000 | ORAL_TABLET | Freq: Four times a day (QID) | ORAL | 0 refills | Status: DC | PRN

## 2024-01-30 NOTE — Telephone Encounter (Signed)
 Name of Medication: Hydrocodone Name of Pharmacy: [facility] Last Fill or Written Date and Quantity: 12-26-23 #75 Last Office Visit and Type: 12-28-23 Next Office Visit and Type: 05-08-24 Last Controlled Substance Agreement Date: 04-21-23 Last UDS: 04-21-23

## 2024-01-31 ENCOUNTER — Other Ambulatory Visit: Payer: Self-pay | Admitting: Internal Medicine

## 2024-02-01 MED ORDER — CLONAZEPAM 1 MG PO TABS
0.5000 mg | ORAL_TABLET | Freq: Two times a day (BID) | ORAL | 0 refills | Status: DC | PRN
Start: 2024-02-01 — End: 2024-02-29

## 2024-02-01 NOTE — Telephone Encounter (Signed)
 Last written 12-31-23 #60 Last OV 12-28-23 Next OV 05-08-24 [facility]

## 2024-02-21 ENCOUNTER — Ambulatory Visit: Admitting: Dermatology

## 2024-02-29 ENCOUNTER — Other Ambulatory Visit: Payer: Self-pay | Admitting: Internal Medicine

## 2024-03-01 MED ORDER — CLONAZEPAM 1 MG PO TABS
0.5000 mg | ORAL_TABLET | Freq: Two times a day (BID) | ORAL | 0 refills | Status: DC | PRN
Start: 2024-03-01 — End: 2024-03-29

## 2024-03-01 MED ORDER — HYDROCODONE-ACETAMINOPHEN 5-325 MG PO TABS: 1.0000 | ORAL_TABLET | Freq: Four times a day (QID) | ORAL | 0 refills | Status: DC | PRN

## 2024-03-01 NOTE — Telephone Encounter (Signed)
 Name of Medication: Hydrocodone Name of Pharmacy: [facility] Last Fill or Written Date and Quantity: 01-30-24 #75 Last Office Visit and Type: 12-28-23 Next Office Visit and Type: 05-08-24 Last Controlled Substance Agreement Date: 04-21-23 Last UDS: 04-21-23        Clonazepam last filed 02-01-24 #60

## 2024-03-02 NOTE — Telephone Encounter (Signed)
 Copied from CRM [id number]. Topic: Clinical - Prescription Issue >> Mar 02, 2024  3:58 PM Heather Russo wrote: Reason for CRM: Patient called to inform that her prescription for clonazePAM (KLONOPIN) 1 MG tablet was sent to the wrong pharmacy; patient needs the prescription sent to the correct preferred pharmacy: [facility] [address] [place] - [address], [address] [place] [address] Phone: [phone]  Fax: [phone] Patient has requested a follow up call when this has been corrected and [facility] to the [facility] [phone]

## 2024-03-02 NOTE — Telephone Encounter (Signed)
 Called and spoke to pt to see if she wanted to get [facility] at [facility] like her [facility]codone. [facility]. [facility] to [facility] rx.

## 2024-03-12 ENCOUNTER — Ambulatory Visit: Admitting: Dermatology

## 2024-03-13 ENCOUNTER — Ambulatory Visit (INDEPENDENT_AMBULATORY_CARE_PROVIDER_SITE_OTHER): Admitting: Dermatology

## 2024-03-13 ENCOUNTER — Encounter: Payer: Self-pay | Admitting: Dermatology

## 2024-03-13 DIAGNOSIS — Z808 Family history of malignant neoplasm of other organs or systems: Secondary | ICD-10-CM

## 2024-03-13 DIAGNOSIS — D492 Neoplasm of unspecified behavior of bone, soft tissue, and skin: Secondary | ICD-10-CM | POA: Diagnosis not present

## 2024-03-13 DIAGNOSIS — D229 Melanocytic nevi, unspecified: Secondary | ICD-10-CM

## 2024-03-13 DIAGNOSIS — D1801 Hemangioma of skin and subcutaneous tissue: Secondary | ICD-10-CM

## 2024-03-13 DIAGNOSIS — L578 Other skin changes due to chronic exposure to nonionizing radiation: Secondary | ICD-10-CM | POA: Diagnosis not present

## 2024-03-13 DIAGNOSIS — Z7189 Other specified counseling: Secondary | ICD-10-CM

## 2024-03-13 DIAGNOSIS — L570 Actinic keratosis: Secondary | ICD-10-CM | POA: Diagnosis not present

## 2024-03-13 DIAGNOSIS — Z1283 Encounter for screening for malignant neoplasm of skin: Secondary | ICD-10-CM | POA: Diagnosis not present

## 2024-03-13 DIAGNOSIS — Z85828 Personal history of other malignant neoplasm of skin: Secondary | ICD-10-CM

## 2024-03-13 DIAGNOSIS — C4491 Basal cell carcinoma of skin, unspecified: Secondary | ICD-10-CM

## 2024-03-13 DIAGNOSIS — L821 Other seborrheic keratosis: Secondary | ICD-10-CM

## 2024-03-13 DIAGNOSIS — D485 Neoplasm of uncertain behavior of skin: Secondary | ICD-10-CM

## 2024-03-13 DIAGNOSIS — C44519 Basal cell carcinoma of skin of other part of trunk: Secondary | ICD-10-CM

## 2024-03-13 DIAGNOSIS — L814 Other melanin hyperpigmentation: Secondary | ICD-10-CM

## 2024-03-13 DIAGNOSIS — W908XXA Exposure to other nonionizing radiation, initial encounter: Secondary | ICD-10-CM

## 2024-03-13 HISTORY — DX: Basal cell carcinoma of skin, unspecified: C44.91

## 2024-03-13 MED ORDER — FLUOROURACIL 5 % EX CREA: TOPICAL_CREAM | CUTANEOUS | 0 refills | Status: DC

## 2024-03-13 NOTE — Patient Instructions (Signed)

## 2024-03-13 NOTE — Progress Notes (Signed)
 Follow-Up Visit   Subjective  Heather Russo is a 58 y.o. female who presents for the following: Patient c/o irregular tender skin lesions on the L forearm, chest, and R ant thigh. Pt has a bx proven SCC on the R med lower leg from 12/2022 that hasn't been treated. Injured L second toenail when young. Lost most of the nail and it didn't grow back normally. L first toenail grew back after injury.  The patient has spots, moles and lesions to be evaluated, some may be new or changing and the patient may have concern these could be cancer.  The following portions of the chart were reviewed this encounter and updated as appropriate: medications, allergies, medical history  Review of Systems:  No other skin or systemic complaints except as noted in HPI or Assessment and Plan.  Objective  Well appearing patient in no apparent distress; mood and affect are within normal limits.  A focused examination was performed of the following areas: the face, scalp, trunk, extremities  Relevant exam findings are noted in the Assessment and Plan.  R chest 0.8 cm pearly pink papule.  L upper lip 0.9 cm white thin plaque  L 2nd toe 1 cm hyperkeratotic plaque   Assessment & Plan   ACTINIC DAMAGE WITH PRECANCEROUS ACTINIC KERATOSES - severe Counseling for Topical Chemotherapy Management: Patient exhibits: - Severe, confluent actinic changes with pre-cancerous actinic keratoses that is secondary to cumulative UV radiation exposure over time - Condition that is severe; chronic, not at goal. - diffuse scaly erythematous macules and papules with underlying dyspigmentation - Discussed Prescription "Field Treatment" topical Chemotherapy for Severe, Chronic Confluent Actinic Changes with Pre-Cancerous Actinic Keratoses Field treatment involves treatment of an entire area of skin that has confluent Actinic Changes (Sun/ Ultraviolet light damage) and PreCancerous Actinic Keratoses by method of PhotoDynamic  Therapy (PDT) and/or prescription Topical Chemotherapy agents such as 5-fluorouracil , 5-fluorouracil /calcipotriene, and/or imiquimod.  The purpose is to decrease the number of clinically evident and subclinical PreCancerous lesions to prevent progression to development of skin cancer by chemically destroying early precancer changes that may or may not be visible.  It has been shown to reduce the risk of developing skin cancer in the treated area. As a result of treatment, redness, scaling, crusting, and open sores may occur during treatment course. One or more than one of these methods may be used and may have to be used several times to control, suppress and eliminate the PreCancerous changes. Discussed treatment course, expected reaction, and possible side effects. - Recommend daily broad spectrum sunscreen SPF 30+ to sun-exposed areas, reapply every 2 hours as needed.  - Staying in the shade or wearing long sleeves, sun glasses (UVA+UVB protection) and wide brim hats (4-inch brim around the entire circumference of the hat) are also recommended. - Call for new or changing lesions. - discussed [place] 5FU calcipotriene $45 plus shipping, works faster, patient prefers standard efudex  - Start 5FU to aa BID until irritation occurs then stop, to aa's face (especially left cheek) and ears, then forearms/hands. Risk of erosion if overused. Expect strong reaction given degree of sun damage. Need frequent follow up to monitor progress and biopsy if not improving. Discussed that patient has missed several appointments, which hinders our care.  - Concern for HAK vs SCCis on left cheek  LENTIGINES, SEBORRHEIC KERATOSES, HEMANGIOMAS - Benign normal skin lesions - Benign-appearing - Call for any changes  MELANOCYTIC NEVI - Tan-brown and/or pink-flesh-colored symmetric macules and papules - Benign appearing  on exam today - Observation - Call clinic for new or changing moles - Recommend daily use of broad  spectrum spf 30+ sunscreen to sun-exposed areas.   ACTINIC DAMAGE - Chronic condition, secondary to cumulative UV/sun exposure - diffuse scaly erythematous macules with underlying dyspigmentation - Recommend daily broad spectrum sunscreen SPF 30+ to sun-exposed areas, reapply every 2 hours as needed.  - Staying in the shade or wearing long sleeves, sun glasses (UVA+UVB protection) and wide brim hats (4-inch brim around the entire circumference of the hat) are also recommended for sun protection.  - Call for new or changing lesions.  SKIN CANCER SCREENING PERFORMED TODAY  SEBORRHEIC KERATOSIS - Samples given of CeraVe SA and Amlactin for aa's feet - Stuck-on, waxy, tan-brown papules and/or plaques  - Benign-appearing - Discussed benign etiology and prognosis. - Observe - Call for any changes  HISTORY OF SQUAMOUS CELL CARCINOMA OF THE SKIN - R med lower leg, although not treated, clear under dermoscopy today. - No evidence of recurrence today - No lymphadenopathy - Recommend regular full body skin exams - Recommend daily broad spectrum sunscreen SPF 30+ to sun-exposed areas, reapply every 2 hours as needed.  - Call if any new or changing lesions are noted between office visits NEOPLASM OF UNCERTAIN BEHAVIOR OF SKIN (3) R chest Skin / nail biopsy Type of biopsy: tangential   Informed consent: discussed and consent obtained   Timeout: patient name, date of birth, surgical site, and procedure verified   Procedure prep:  Patient was prepped and draped in usual sterile fashion Prep type:  Isopropyl alcohol Anesthesia: the lesion was anesthetized in a standard fashion   Anesthetic:  1% lidocaine w/ epinephrine 1-100,000 buffered w/ 8.4% NaHCO3 Instrument used: flexible razor blade   Hemostasis achieved with: pressure, aluminum chloride and electrodesiccation   Outcome: patient tolerated procedure well   Post-procedure details: sterile dressing applied and wound care instructions given    Dressing type: bandage (Mupirocin 2% ointment)   Specimen 1 - Surgical pathology Differential Diagnosis: D48.5 r/o BCC vs other Check Margins: No L upper lip Skin / nail biopsy Type of biopsy: tangential   Informed consent: discussed and consent obtained   Timeout: patient name, date of birth, surgical site, and procedure verified   Procedure prep:  Patient was prepped and draped in usual sterile fashion Prep type:  Isopropyl alcohol Anesthesia: the lesion was anesthetized in a standard fashion   Anesthetic:  1% lidocaine w/ epinephrine 1-100,000 buffered w/ 8.4% NaHCO3 Instrument used: flexible razor blade   Hemostasis achieved with: pressure, aluminum chloride and electrodesiccation   Outcome: patient tolerated procedure well   Post-procedure details: sterile dressing applied and wound care instructions given   Dressing type: bandage (Mupirocin 2% ointment)   Specimen 2 - Surgical pathology Differential Diagnosis: r/o SCC vs BCC Check Margins: No L 2nd toe Discussed topical 5FU to L upper lip, patient prefers biopsy today. If positive recommend Mohs for neo on the L upper lip. No hx of diabetes and A1c was normal 10 months ago so biopsy should not cause diabetic ulcer. Syringe with lidocaine without epinephrine would not penetrate lesion on the L 2nd toe. Patient will schedule another appointment to numb and biopsy lesion.  MULTIPLE BENIGN NEVI   LENTIGINES   ACTINIC ELASTOSIS   SEBORRHEIC KERATOSES   CHERRY ANGIOMA   ACTINIC KERATOSES   Related Medications fluorouracil  (EFUDEX ) 5 % cream Apply to rough scaly area on the face and ears BID until irritation occurs.  FAMILY HISTORY OF SKIN CANCER What type(s): Unknown Who affected: Mother  Return in about 2 months (around 05/13/2024) for follow up and recheck of previously treated skin lesions.  Arlinda Lais, CMA, am acting as scribe for Harris Liming, MD .  Documentation: I have reviewed the above  documentation for accuracy and completeness, and I agree with the above.  Harris Liming, MD

## 2024-03-20 LAB — SURGICAL PATHOLOGY

## 2024-03-21 ENCOUNTER — Telehealth: Payer: Self-pay

## 2024-03-21 NOTE — Telephone Encounter (Signed)
-----   Message from [facility] sent at 03/21/2024  2:15 PM EDT ----- Diagnosis: 1. Skin, R chest :       SUPERFICIAL AND NODULAR BASAL CELL CARCINOMA        2. Skin, L upper lip :       HYPERTROPHIC ACTINIC KERATOSIS    Please call to share diagnosis and discuss treatment options. Please message me with patient's choice and schedule ED&C or prescribe imiquimod with 3 month follow up for recheck to ensure clearance (assuming patient completes treatment course)  1. R chest Explanation: biopsy shows a basal cell skin cancer limited to the top and second layers of skin.   Treatment option 1: you return for a brief appointment where I perform electrodesiccation and curettage [facility]). This involves three rounds of scraping and burning to destroy the skin cancer. It has about an 80% cure rate and leaves a round wound slightly larger than the skin cancer and leaves a round Lynniah Janoski scar. No additional pathology is done. If the skin cancer comes back, we would need to do a surgery to remove it.   Treatment option 2: you return for an hour long appointment where we perform a skin surgery. We numb the site of the skin cancer and a safety margin of normal skin around it. We remove the full thickness of skin and close the wound with two layers of stitches. The sample is sent to the lab to check that the skin cancer was fully removed. Return one week later to have wound checked and surface stitches removed. Surgical wound leaves a line scar. Approximately 95% cure rate  2. L upper lip Biopsy shows a thickened precancer that may develop into a squamous type skin cancer if left untreated.  Treatment: please ask if patient obtained Efudex  from regular pharmacy. INSTRUCTIONS: let biopsy site heal for 2 weeks and then apply efudex  twice daily until reaction develops then stop. Reaction includes redness, itching, irritation, crusting. Apply to upper lip and the skin under the nose (upper cutaneous lip and  philtrum)

## 2024-03-21 NOTE — Telephone Encounter (Signed)
 Patient advised of BX results. She would like to scheduled EDC and asked could this be done in office at next appt. Moved appt from May 6th to May 8th to allow 30 mins for appt. She understands instruction on Efudex  use to lip.

## 2024-03-27 ENCOUNTER — Other Ambulatory Visit: Payer: Self-pay | Admitting: Internal Medicine

## 2024-03-27 ENCOUNTER — Ambulatory Visit: Admitting: Dermatology

## 2024-03-29 ENCOUNTER — Encounter: Payer: Self-pay | Admitting: Dermatology

## 2024-03-29 ENCOUNTER — Other Ambulatory Visit: Payer: Self-pay | Admitting: Internal Medicine

## 2024-03-29 ENCOUNTER — Ambulatory Visit: Admitting: Dermatology

## 2024-03-29 DIAGNOSIS — D492 Neoplasm of unspecified behavior of bone, soft tissue, and skin: Secondary | ICD-10-CM

## 2024-03-29 DIAGNOSIS — L570 Actinic keratosis: Secondary | ICD-10-CM

## 2024-03-29 DIAGNOSIS — C44519 Basal cell carcinoma of skin of other part of trunk: Secondary | ICD-10-CM | POA: Diagnosis not present

## 2024-03-29 HISTORY — DX: Actinic keratosis: L57.0

## 2024-03-29 MED ORDER — CLONAZEPAM 1 MG PO TABS: 0.5000 mg | ORAL_TABLET | Freq: Two times a day (BID) | ORAL | 0 refills | Status: DC | PRN

## 2024-03-29 MED ORDER — HYDROCODONE-ACETAMINOPHEN 5-325 MG PO TABS: 1.0000 | ORAL_TABLET | Freq: Four times a day (QID) | ORAL | 0 refills | Status: DC | PRN

## 2024-03-29 NOTE — Telephone Encounter (Signed)
 Name of Medication: Hydrocodone  Name of Pharmacy: [facility] Last Fill or Written Date and Quantity: 03-01-24 #75 Last Office Visit and Type: 12-28-23 Next Office Visit and Type: 05-08-24 Last Controlled Substance Agreement Date: 04-21-23 Last UDS: 04-21-23

## 2024-03-29 NOTE — Progress Notes (Signed)
   Follow-Up Visit   Subjective  Heather Russo is a 58 y.o. female who presents for the following: treatment of biopsy proven BCC. Right chest. Bx. 03/13/2024  Biopsy lesion on L-2 toe.   The patient has spots, moles and lesions to be evaluated, some may be new or changing and the patient may have concern these could be cancer.    The following portions of the chart were reviewed this encounter and updated as appropriate: medications, allergies, medical history  Review of Systems:  No other skin or systemic complaints except as noted in HPI or Assessment and Plan.  Objective  Well appearing patient in no apparent distress; mood and affect are within normal limits.  A focused examination was performed of the following areas: Chest, L-2 toe  Relevant physical exam findings are noted in the Assessment and Plan.  Right Chest Pink ulcerated biopsy site Left Tip of 2nd Toe 1 cm hyperkeratotic plaque    Assessment & Plan   BASAL CELL CARCINOMA (BCC) OF SKIN OF OTHER PART OF TORSO Right Chest Destruction of lesion Complexity: simple   Destruction method: electrodesiccation and curettage   Informed consent: discussed and consent obtained   Timeout:  patient name, date of birth, surgical site, and procedure verified Procedure prep:  Patient was prepped and draped in usual sterile fashion Prep type:  Isopropyl alcohol Anesthesia: the lesion was anesthetized in a standard fashion   Anesthetic:  1% lidocaine w/ epinephrine 1-100,000 local infiltration Curettage performed in three different directions: Yes   Electrodesiccation performed over the curetted area: Yes   Curettage cycles:  3 Final wound size (cm):  1.2 Hemostasis achieved with:  aluminum chloride and electrodesiccation Outcome: patient tolerated procedure well with no complications   Post-procedure details: sterile dressing applied and wound care instructions given   Dressing type: bandage and petrolatum   NEOPLASM OF  SKIN Left Tip of 2nd Toe Skin / nail biopsy Type of biopsy: tangential   Informed consent: discussed and consent obtained   Timeout: patient name, date of birth, surgical site, and procedure verified   Procedure prep:  Patient was prepped and draped in usual sterile fashion Prep type:  Isopropyl alcohol Anesthesia: the lesion was anesthetized in a standard fashion   Local anesthetic: 0.7 ml 1% plain lidocaine injected at base of toe to perform nerve block. Instrument used: DermaBlade   Hemostasis achieved with: pressure and aluminum chloride   Outcome: patient tolerated procedure well   Post-procedure details: sterile dressing applied and wound care instructions given   Dressing type: bandage and petrolatum   Specimen 1 - Surgical pathology Differential Diagnosis: SCC vs verruca vs nail matrix neoplasm  Check Margins: No   Return in about 6 months (around 09/29/2024) for TBSE, HxBCC.  I, Jill Parcell, CMA, am acting as scribe for Harris Liming, MD.   Documentation: I have reviewed the above documentation for accuracy and completeness, and I agree with the above.  Harris Liming, MD

## 2024-03-29 NOTE — Telephone Encounter (Signed)
 Last filled 03-01-24 #60 Last OV 12-28-23 Next OV 05-08-24 [facility] [place]

## 2024-03-29 NOTE — Telephone Encounter (Signed)
 Copied from CRM [id number]. Topic: Clinical - Medication Refill >> Mar 29, 2024  9:43 AM Heather Russo wrote: Medication: HYDROcodone -acetaminophen  (NORCO/VICODIN) 5-325 MG tablet  Has the patient contacted their pharmacy? Yes (Agent: If no, request that the patient contact the pharmacy for the refill. If patient does not wish to contact the pharmacy document the reason why and proceed with request.) (Agent: If yes, when and what did the pharmacy advise?)  This is the patient's preferred pharmacy:  [facility] [address], [place] - [address] Heather [place] [address] Phone: [phone] Fax: [phone]  [facility] [address] - [place], [place] - [address] [facility] [address] [facility] [address] [employer] [place] [address] Phone: [phone] Fax: [phone]  Is this the correct pharmacy for this prescription? Yes If no, delete pharmacy and type the correct one.   Has the prescription been filled recently? No  Is the patient out of the medication? Yes- tomorrow  Has the patient been seen for an appointment in the last year OR does the patient have an upcoming appointment? Yes  Can we respond through MyChart? Yes  Agent: Please be advised that Rx refills may take up to 3 business days. We ask that you follow-up with your pharmacy.

## 2024-03-29 NOTE — Telephone Encounter (Signed)
 Copied from CRM [id number]. Topic: Clinical - Medication Refill >> Mar 29, 2024  9:38 AM Clyde Darling P wrote: Medication: clonazePAM  (KLONOPIN ) 1 MG tablet  Has the patient contacted their pharmacy? Yes (Agent: If no, request that the patient contact the pharmacy for the refill. If patient does not wish to contact the pharmacy document the reason why and proceed with request.) (Agent: If yes, when and what did the pharmacy advise?)  This is the patient's preferred pharmacy:  [facility] [address] [place] - [address] [place] [address] Phone: [phone] Fax: [phone]  Is this the correct pharmacy for this prescription? Yes If no, delete pharmacy and type the correct one.   Has the prescription been filled recently? No  Is the patient out of the medication? Yes- tomorrow  Has the patient been seen for an appointment in the last year OR does the patient have an upcoming appointment? Yes  Can we respond through MyChart? Yes  Agent: Please be advised that Rx refills may take up to 3 business days. We ask that you follow-up with your pharmacy.

## 2024-03-29 NOTE — Patient Instructions (Addendum)

## 2024-03-30 ENCOUNTER — Other Ambulatory Visit: Payer: Self-pay | Admitting: Internal Medicine

## 2024-04-02 MED ORDER — HYDROCODONE-ACETAMINOPHEN 5-325 MG PO TABS: 1.0000 | ORAL_TABLET | Freq: Four times a day (QID) | ORAL | 0 refills | Status: DC | PRN

## 2024-04-02 NOTE — Telephone Encounter (Signed)
 The hydrocodone  needed to go to [facility] in [place]

## 2024-04-03 ENCOUNTER — Ambulatory Visit: Payer: Self-pay | Admitting: Dermatology

## 2024-04-03 LAB — SURGICAL PATHOLOGY

## 2024-04-03 NOTE — Telephone Encounter (Signed)
-----   Message from [place] sent at 04/03/2024  5:07 PM EDT ----- Diagnosis: left tip of 2nd toe :       ACTINIC KERATOSIS WITH FEATURES OF A VERRUCA   Plan: Please call and schedule shave removal in 30 minute slot (please make sure another appointment is not scheduled within the 30 min). Biopsy from the left second toe shoes precancerous change due to a HPV virus. I recommend that we numb the toe again and remove all of the abnormal skin. We will send it for pathology again to make sure there is no skin cancer. The procedure may destroy any remaining toenail, but it's important to ensure there is no cancer.

## 2024-04-03 NOTE — Telephone Encounter (Signed)
Called patient. N/A. Unable to leave message.

## 2024-04-09 NOTE — Telephone Encounter (Signed)
-----   Message from [place] sent at 04/03/2024  5:07 PM EDT ----- Diagnosis: left tip of 2nd toe :       ACTINIC KERATOSIS WITH FEATURES OF A VERRUCA   Plan: Please call and schedule shave removal in 30 minute slot (please make sure another appointment is not scheduled within the 30 min). Biopsy from the left second toe shoes precancerous change due to a HPV virus. I recommend that we numb the toe again and remove all of the abnormal skin. We will send it for pathology again to make sure there is no skin cancer. The procedure may destroy any remaining toenail, but it's important to ensure there is no cancer.

## 2024-04-09 NOTE — Telephone Encounter (Signed)
 Discussed pathology results with patient. 30 minute appointment scheduled 04/24/2024 at 2:30 pm

## 2024-04-23 ENCOUNTER — Ambulatory Visit: Admitting: Dermatology

## 2024-04-23 ENCOUNTER — Encounter: Payer: BLUE CROSS/BLUE SHIELD | Admitting: Internal Medicine

## 2024-04-24 ENCOUNTER — Ambulatory Visit (INDEPENDENT_AMBULATORY_CARE_PROVIDER_SITE_OTHER): Admitting: Dermatology

## 2024-04-24 ENCOUNTER — Encounter: Payer: Self-pay | Admitting: Dermatology

## 2024-04-24 DIAGNOSIS — L570 Actinic keratosis: Secondary | ICD-10-CM | POA: Diagnosis not present

## 2024-04-24 DIAGNOSIS — D489 Neoplasm of uncertain behavior, unspecified: Secondary | ICD-10-CM

## 2024-04-24 NOTE — Progress Notes (Unsigned)
   Follow-Up Visit   Subjective  Heather Russo is a 58 y.o. female who presents for the following: shave removal of bx proven AK with features of verruca at left tip of 2nd toe.   The patient has spots, moles and lesions to be evaluated, some may be new or changing and the patient may have concern these could be cancer.   The following portions of the chart were reviewed this encounter and updated as appropriate: medications, allergies, medical history  Review of Systems:  No other skin or systemic complaints except as noted in HPI or Assessment and Plan.  Objective  Well appearing patient in no apparent distress; mood and affect are within normal limits.   A focused examination was performed of the following areas: Left foot  Relevant exam findings are noted in the Assessment and Plan.  left tip of 2nd toe Hyperkeratotic irregular plaque  Assessment & Plan     NEOPLASM OF UNCERTAIN BEHAVIOR left tip of 2nd toe Epidermal / dermal shaving  Lesion diameter (cm):  1 Informed consent: discussed and consent obtained   Timeout: patient name, date of birth, surgical site, and procedure verified   Procedure prep:  Patient was prepped and draped in usual sterile fashion Prep type:  Isopropyl alcohol Anesthesia: the lesion was anesthetized in a standard fashion   Anesthetic:  1% lidocaine plain local infiltration (digital block) Instrument used: DermaBlade   Hemostasis achieved with: pressure and aluminum chloride   Outcome: patient tolerated procedure well   Post-procedure details: wound care instructions given   Specimen 1 - Surgical pathology Differential Diagnosis: BX proven ACTINIC KERATOSIS WITH FEATURES OF A VERRUCA, check for [facility]  Check Margins: yes  [id number] 2 pieces Shave removal  Return for TBSE, as scheduled, with Dr. Felipe Russo, HxBCC, HxSCC, HxAK.  Heather Russo, RMA, am acting as scribe for Heather Liming, MD .   Documentation: I have reviewed  the above documentation for accuracy and completeness, and I agree with the above.  Heather Liming, MD

## 2024-04-24 NOTE — Patient Instructions (Signed)

## 2024-04-26 ENCOUNTER — Ambulatory Visit: Payer: Self-pay | Admitting: Dermatology

## 2024-04-26 LAB — SURGICAL PATHOLOGY

## 2024-04-30 NOTE — Telephone Encounter (Signed)
 Discussed pathology results. 3 month follow up appointment scheduled.

## 2024-04-30 NOTE — Telephone Encounter (Signed)
-----   Message from [place] sent at 04/26/2024  6:10 PM EDT ----- Diagnosis: left tip of 2nd toe :       ACTINIC KERATOSIS WITH FEATURES OF A VERRUCA (SUPERFICIAL BIOPSY)    Plan: please call to share that biopsy shows the remaining abnormal skin was in the precancerous stage and not yet skin cancer. Please make 3 month follow up for recheck

## 2024-05-02 ENCOUNTER — Telehealth: Payer: Self-pay | Admitting: Internal Medicine

## 2024-05-02 ENCOUNTER — Other Ambulatory Visit: Payer: Self-pay | Admitting: Internal Medicine

## 2024-05-02 NOTE — Telephone Encounter (Signed)
 Copied from CRM [id number]. Topic: Clinical - Medication Refill >> May 02, 2024  2:19 PM Leah C wrote: Medication: HYDROcodone -acetaminophen  (NORCO/VICODIN) 5-325 MG tablet  Has the patient contacted their pharmacy? Yes (Agent: If no, request that the patient contact the pharmacy for the refill. If patient does not wish to contact the pharmacy document the reason why and proceed with request.) (Agent: If yes, when and what did the pharmacy advise?)  This is the patient's preferred pharmacy:  [facility] [address] - [place], [place] - [address] [facility] [address] [facility] [address] [facility] [place] [address] Phone: [phone] Fax: [phone]  Is this the correct pharmacy for this prescription? Yes If no, delete pharmacy and type the correct one.   Has the prescription been filled recently? Yes  Is the patient out of the medication? Yes  Has the patient been seen for an appointment in the last year OR does the patient have an upcoming appointment? Yes  Can we respond through MyChart? Yes  Agent: Please be advised that Rx refills may take up to 3 business days. We ask that you follow-up with your pharmacy.

## 2024-05-02 NOTE — Telephone Encounter (Signed)
 Copied from CRM [id number]. Topic: Clinical - Medication Refill >> May 02, 2024  2:27 PM Leah C wrote: Medication: clonazePAM  (KLONOPIN ) 1 MG tablet  Has the patient contacted their pharmacy? Yes (Agent: If no, request that the patient contact the pharmacy for the refill. If patient does not wish to contact the pharmacy document the reason why and proceed with request.) (Agent: If yes, when and what did the pharmacy advise?)  This is the patient's preferred pharmacy:  [facility] [address] [place] - [address] [place] [address] Phone: [phone] Fax: [phone]  Is this the correct pharmacy for this prescription? Yes If no, delete pharmacy and type the correct one.   Has the prescription been filled recently? Yes  Is the patient out of the medication? Yes  Has the patient been seen for an appointment in the last year OR does the patient have an upcoming appointment? Yes  Can we respond through MyChart? Yes  Agent: Please be advised that Rx refills may take up to 3 business days. We ask that you follow-up with your pharmacy.

## 2024-05-03 MED ORDER — CLONAZEPAM 1 MG PO TABS: 0.5000 mg | ORAL_TABLET | Freq: Two times a day (BID) | ORAL | 0 refills | Status: DC | PRN

## 2024-05-03 MED ORDER — HYDROCODONE-ACETAMINOPHEN 5-325 MG PO TABS: 1.0000 | ORAL_TABLET | Freq: Four times a day (QID) | ORAL | 0 refills | Status: DC | PRN

## 2024-05-03 NOTE — Telephone Encounter (Signed)
 Tried to call pt to verify pharmacies. No answer. No VM. She has been getting hydrocodone  at [facility] and clonazepam  at [facility]. The message that was taken [facility] says [facility] for both.

## 2024-05-03 NOTE — Telephone Encounter (Signed)
 Copied from CRM [id number]. Topic: Clinical - Medication Refill >> May 02, 2024  2:27 PM Leah C wrote: Medication: clonazePAM  (KLONOPIN ) 1 MG tablet  Has the patient contacted their pharmacy? Yes (Agent: If no, request that the patient contact the pharmacy for the refill. If patient does not wish to contact the pharmacy document the reason why and proceed with request.) (Agent: If yes, when and what did the pharmacy advise?)  This is the patient's preferred pharmacy:  [facility] [address], [place] - [address] [place] [address] Phone: [phone] Fax: [phone]  Is this the correct pharmacy for this prescription? Yes If no, delete pharmacy and type the correct one.   Has the prescription been filled recently? Yes  Is the patient out of the medication? Yes  Has the patient been seen for an appointment in the last year OR does the patient have an upcoming appointment? Yes  Can we respond through MyChart? Yes  Agent: Please be advised that Rx refills may take up to 3 business days. We ask that you follow-up with your pharmacy. >> May 03, 2024 11:51 AM Chuck Crater wrote: Patient stated that she wants her Clonazepam  sent to [facility] and her Hydrocodone  sent to [facility].

## 2024-05-03 NOTE — Telephone Encounter (Signed)
>>   May 03, 2024 11:51 AM Chuck Crater wrote: Patient stated that she wants her Clonazepam  sent to [facility] and her Hydrocodone  sent to [facility].

## 2024-05-04 MED ORDER — HYDROCODONE-ACETAMINOPHEN 5-325 MG PO TABS: 1.0000 | ORAL_TABLET | Freq: Four times a day (QID) | ORAL | 0 refills | Status: DC | PRN

## 2024-05-04 NOTE — Telephone Encounter (Signed)
 Called [facility] to speak to someone to cancel the rx sent there. No one answered twice after it rang for 2 mins each time. Had to leave a message.

## 2024-05-04 NOTE — Telephone Encounter (Signed)
 Would you mind resending this in for this pt. It was documented in the message that she needs hydrocodone  sent to [facility] and Cl[facility]  to [facility]. But, the hydrocodone  was sent to [facility], too.

## 2024-05-04 NOTE — Telephone Encounter (Signed)
 Called pt to let her know it has been corrected

## 2024-05-04 NOTE — Telephone Encounter (Signed)
 Copied from CRM [id number]. Topic: Clinical - Medication Question >> May 04, 2024  4:07 PM Aisha D wrote: Reason for CRM: Pt stated that she needs to have the medication for the HYDROcodone -acetaminophen  (NORCO/VICODIN) 5-325 MG tablet resent to the correct pharmacy. Pt stated that the medication was sent yesterday but it was sent to the [facility] pharmacy which is incorrect. Pt would like to have the medication resent today because she is currently out and needs to take the medication today.

## 2024-05-08 ENCOUNTER — Ambulatory Visit (INDEPENDENT_AMBULATORY_CARE_PROVIDER_SITE_OTHER): Payer: No Typology Code available for payment source | Admitting: Internal Medicine

## 2024-05-08 ENCOUNTER — Telehealth: Payer: Self-pay

## 2024-05-08 ENCOUNTER — Other Ambulatory Visit (HOSPITAL_COMMUNITY): Payer: Self-pay

## 2024-05-08 ENCOUNTER — Encounter: Payer: Self-pay | Admitting: Internal Medicine

## 2024-05-08 VITALS — BP 120/78 | HR 71 | Temp 98.7°F | Ht 61.0 in | Wt 130.0 lb

## 2024-05-08 DIAGNOSIS — G8929 Other chronic pain: Secondary | ICD-10-CM

## 2024-05-08 DIAGNOSIS — I10 Essential (primary) hypertension: Secondary | ICD-10-CM

## 2024-05-08 DIAGNOSIS — Z0000 Encounter for general adult medical examination without abnormal findings: Secondary | ICD-10-CM

## 2024-05-08 DIAGNOSIS — F39 Unspecified mood [affective] disorder: Secondary | ICD-10-CM

## 2024-05-08 DIAGNOSIS — M545 Low back pain, unspecified: Secondary | ICD-10-CM

## 2024-05-08 DIAGNOSIS — Z23 Encounter for immunization: Secondary | ICD-10-CM | POA: Diagnosis not present

## 2024-05-08 DIAGNOSIS — F1120 Opioid dependence, uncomplicated: Secondary | ICD-10-CM

## 2024-05-08 DIAGNOSIS — R079 Chest pain, unspecified: Secondary | ICD-10-CM | POA: Insufficient documentation

## 2024-05-08 DIAGNOSIS — R1319 Other dysphagia: Secondary | ICD-10-CM

## 2024-05-08 LAB — COMPREHENSIVE METABOLIC PANEL WITH GFR
ALT: 19 U/L (ref 0–35)
AST: 26 U/L (ref 0–37)
Albumin: 4.1 g/dL (ref 3.5–5.2)
Alkaline Phosphatase: 89 U/L (ref 39–117)
BUN: 10 mg/dL (ref 6–23)
CO2: 31 meq/L (ref 19–32)
Calcium: 9.4 mg/dL (ref 8.4–10.5)
Chloride: 104 meq/L (ref 96–112)
Creatinine, Ser: 0.92 mg/dL (ref 0.40–1.20)
GFR: 68.59 mL/min (ref >60.00)
Glucose, Bld: 79 mg/dL (ref 70–99)
Potassium: 4.8 meq/L (ref 3.5–5.1)
Sodium: 139 meq/L (ref 135–145)
Total Bilirubin: 0.4 mg/dL (ref 0.2–1.2)
Total Protein: 6.5 g/dL (ref 6.0–8.3)

## 2024-05-08 LAB — CBC
HCT: 45.8 % (ref 36.0–46.0)
Hemoglobin: 15.1 g/dL — ABNORMAL HIGH (ref 12.0–15.0)
MCHC: 33.1 g/dL (ref 30.0–36.0)
MCV: 90.4 fl (ref 78.0–100.0)
Platelets: 316 10*3/uL (ref 150.0–400.0)
RBC: 5.06 Mil/uL (ref 3.87–5.11)
RDW: 13.6 % (ref 11.5–15.5)
WBC: 12.1 10*3/uL — ABNORMAL HIGH (ref 4.0–10.5)

## 2024-05-08 LAB — LIPID PANEL
Cholesterol: 226 mg/dL — ABNORMAL HIGH (ref 0–200)
HDL: 38.5 mg/dL — ABNORMAL LOW (ref >39.00)
LDL Cholesterol: 156 mg/dL — ABNORMAL HIGH (ref 0–99)
NonHDL: 187.24
Total CHOL/HDL Ratio: 6
Triglycerides: 158 mg/dL — ABNORMAL HIGH (ref 0.0–149.0)
VLDL: 31.6 mg/dL (ref 0.0–40.0)

## 2024-05-08 LAB — TSH: TSH: 0.97 u[IU]/mL (ref 0.35–5.50)

## 2024-05-08 MED ORDER — OMEPRAZOLE 20 MG PO CPDR: 20.0000 mg | DELAYED_RELEASE_CAPSULE | Freq: Every day | ORAL | 3 refills | Status: AC

## 2024-05-08 NOTE — Assessment & Plan Note (Signed)
 Atypical but also some SOB (smoker) Negative stress echo almost 10 years ago Will set up with cardiology for reevaluation

## 2024-05-08 NOTE — Assessment & Plan Note (Signed)
 Colonoscopy and pap due 2027 Mammogram scheduled for next month Flu vaccine in the fall--prefers no COVID updates Shingrix  #2 today Discussed exercise--mostly yard/house work

## 2024-05-08 NOTE — Assessment & Plan Note (Signed)
 BP Readings from Last 3 Encounters:  05/08/24 120/78  12/28/23 100/70  09/27/23 120/74   Controlled without meds

## 2024-05-08 NOTE — Assessment & Plan Note (Signed)
 Will try the physiatry referral again Hydrocodone  daily does help some

## 2024-05-08 NOTE — Assessment & Plan Note (Signed)
 Depression ---largely reactive---is chronic Chronic anxiety as well No longer seeing psychiatrist On venlafaxine  and clonazepam ---trazodone  for sleep

## 2024-05-08 NOTE — Assessment & Plan Note (Signed)
 PDMP reviewed No concerns Updated contract and UDS today

## 2024-05-08 NOTE — Assessment & Plan Note (Signed)
 Will try omeprazole  20mg  daily

## 2024-05-08 NOTE — Addendum Note (Signed)
 Addended by: Franne Ivory on: 05/08/2024 12:16 PM   Modules accepted: Orders

## 2024-05-08 NOTE — Telephone Encounter (Signed)
 Pharmacy Patient Advocate Encounter   Received notification from CoverMyMeds that prior authorization for Norco 5 is required/requested.   Insurance verification completed.   The patient is insured through [facility] .   Per test claim: PA required; PA submitted to above mentioned insurance via CoverMyMeds Key/confirmation #/EOC [id number] Status is pending

## 2024-05-08 NOTE — Progress Notes (Signed)
 Subjective:    Patient ID: Heather Russo, female    DOB: 04/17/1966, 58 y.o.   MRN: [id number]  HPI Here for physical  Never went to physiatrist Not sure what happened with this Willing to try this though Continues to use the hydrocodone  2-3 times a day Heather Russo off steps last week--hit butt---but jarred her back and made things worse  Depression continues---still daily Has lots of stress in caring for mom--and helping older brother with appointments (he is deaf) Boyfriend had surgery on elbow--now needs more surgery. So she cares for him and has all the household tasks Thinks about dying at times---no suicidal ideation Didn't find psychiatrist that helpful (and expensive) Does enjoy going to pool--not much else Uses clonazepam  mostly for sleep--but also in the day if overwhelmed   GFRs generally in the 50's over the past few years  Still smokes Stress makes stopping unreasonable  Current Outpatient Medications on File Prior to Visit  Medication Sig Dispense Refill   albuterol  (VENTOLIN  HFA) 108 (90 Base) MCG/ACT inhaler Inhale 2 puffs into the lungs every 6 (six) hours as needed for wheezing or shortness of breath. Use with spacer 18 g 1   clonazePAM  (KLONOPIN ) 1 MG tablet Take 0.5-1 tablets (0.5-1 mg total) by mouth 2 (two) times daily as needed for anxiety. 60 tablet 0   fexofenadine (ALLEGRA) 180 MG tablet Take 180 mg by mouth daily as needed.     fluorouracil  (EFUDEX ) 5 % cream Apply to rough scaly area on the face and ears BID until irritation occurs. 40 g 0   gabapentin  (NEURONTIN ) 100 MG capsule TAKE 2 CAPSULES BY MOUTH THREE TIMES DAILY 180 capsule 11   HYDROcodone -acetaminophen  (NORCO/VICODIN) 5-325 MG tablet Take 1 tablet by mouth every 6 (six) hours as needed for moderate pain (pain score 4-6). 75 tablet 0   traZODone  (DESYREL ) 100 MG tablet Take 1 tablet (100 mg total) by mouth at bedtime. 90 tablet 3   venlafaxine  XR (EFFEXOR -XR) 75 MG 24 hr capsule TAKE 3 CAPSULES BY  MOUTH ONCE DAILY 270 capsule 3   No current facility-administered medications on file prior to visit.    Allergies  Allergen Reactions   Bupropion Hcl     REACTION: rash, nausea, bad taste in mouth, blurred vision   Lisinopril     REACTION: cough,dizziness   Mirtazapine     REACTION: unspecified   Mysoline  [Primidone ] Hives    Likely cause of hives    Past Medical History:  Diagnosis Date   Actinic keratosis 03/29/2024   Left tip of 2nd toe. AK with features of verruca. Repeat biopsy 04/24/2024. Recheck in 3 months   Allergic rhinitis    Allergy    Anxiety    BCC (basal cell carcinoma) 03/13/2024   R chest. Superficial and nodular. 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 Although not treated, clear under dermoscopy 03/13/2024   Syncope and collapse    Tremor, essential     Past Surgical History:  Procedure Laterality Date   ESOPHAGOGASTRODUODENOSCOPY  05/05/06   LAPAROSCOPIC CHOLECYSTECTOMY  03/2008   wisdom  teeth removeal      Family History  Problem Relation Age of Onset   Hypertension Mother    Tremor Mother    Hypertension Father    Heart attack Father    Diabetes Father    Heart Problems Father    Hypertension Brother    Diabetes Brother    Hypertension Brother    Tremor Brother    Cancer Paternal Grandmother        Lung   Colon cancer Neg Hx    Esophageal cancer Neg Hx    Pancreatic cancer Neg Hx    Rectal cancer Neg Hx    Stomach cancer Neg Hx    Breast cancer Neg Hx     Social History   Socioeconomic History   Marital status: Widowed    Spouse name: Not on file   Number of children: 1   Years of education: Not on file   Highest education level: Not on  file  Occupational History   Occupation:  Inventory clerk-- laid off 2015    Comment:     Occupation: Therapist, sports    Comment: unemployed now  Tobacco Use   Smoking status: Every Day    Current packs/day: 0.00    Types: Cigarettes    Last attempt to quit: 01/02/2018    Years since quitting: 6.3    Passive exposure: Past (as a child)   Smokeless tobacco: Never   Tobacco comments:       Substance and Sexual Activity   Alcohol use: Yes    Alcohol/week: 0.0 standard drinks of alcohol    Comment: rare   Drug use: No   Sexual activity: Not on file  Other Topics Concern   Not on file  Social History Narrative   Widowed 10/12   Engaged with 2 year relationship--but he died 09-19-24   Social Drivers of Health   Financial Resource Strain: Not on file  Food Insecurity: Not on file  Transportation Needs: Not on file  Physical Activity: Not on file  Stress: Not on file  Social Connections: Not on file  Intimate Partner Violence: Not on file   Review of Systems  Constitutional:  Positive for fatigue. Negative for unexpected weight change.       Wears seat belt  HENT:  Negative for hearing loss and tinnitus.        Really needs dental care  Eyes:  Negative for visual disturbance.       No diplopia or unilateral vision loss Due for exam  Respiratory:  Positive for cough. Negative for chest tightness and shortness of breath.   Cardiovascular:  Positive for palpitations. Negative for leg swelling.       Vague chest pain at times--better if she leans forward and takes deep breaths Some exertional   Gastrointestinal:  Negative for blood in stool and constipation.       Occ heartburn--uses OTC with success. Some issues with swallowing liquids  Endocrine: Positive for polydipsia. Negative for polyuria.  Genitourinary:  Negative for dyspareunia, dysuria and hematuria.  Musculoskeletal:  Positive for arthralgias and back pain. Negative for joint swelling.  Skin:  Negative for rash.        Recent BCC removed--keeps up with derm  Allergic/Immunologic: Positive for environmental allergies. Negative for immunocompromised state.       Allegra helps  Neurological:  Negative for dizziness, syncope and light-headedness.       Stress headaches  Hematological:  Negative for adenopathy. Bruises/bleeds easily.  Psychiatric/Behavioral:  Positive for dysphoric mood and sleep disturbance. The patient is nervous/anxious.        Objective:   Physical Exam Constitutional:      Appearance: Normal appearance.  HENT:     Mouth/Throat:     Pharynx: No oropharyngeal exudate or posterior oropharyngeal erythema.   Eyes:     Conjunctiva/sclera: Conjunctivae normal.     Pupils: Pupils are equal, round, and reactive to light.    Cardiovascular:     Rate and Rhythm: Normal rate and regular rhythm.     Pulses: Normal pulses.     Heart sounds: No murmur heard.    No gallop.  Pulmonary:     Effort: Pulmonary effort is normal.     Breath sounds: Normal breath sounds. No wheezing or rales.  Abdominal:     Palpations: Abdomen is soft.     Tenderness: There is no abdominal tenderness.   Musculoskeletal:     Cervical back: Neck supple.     Right lower leg: No edema.     Left lower leg: No edema.  Lymphadenopathy:     Cervical: No cervical adenopathy.   Skin:    Findings: No rash.   Neurological:     General: No focal deficit present.     Mental Status: She is alert and oriented to person, place, and time.   Psychiatric:        Mood and Affect: Mood normal.        Behavior: Behavior normal.            Assessment & Plan:

## 2024-05-09 ENCOUNTER — Ambulatory Visit: Payer: Self-pay | Admitting: Internal Medicine

## 2024-05-10 LAB — DRUG MONITORING, PANEL 8 WITH CONFIRMATION, URINE
6 Acetylmorphine: NEGATIVE ng/mL (ref <10)
Alcohol Metabolites: NEGATIVE ng/mL (ref <500)
Alphahydroxyalprazolam: NEGATIVE ng/mL (ref <25)
Alphahydroxymidazolam: NEGATIVE ng/mL (ref <50)
Alphahydroxytriazolam: NEGATIVE ng/mL (ref <50)
Aminoclonazepam: 536 ng/mL — ABNORMAL HIGH (ref <25)
Amphetamines: NEGATIVE ng/mL (ref <500)
Benzodiazepines: POSITIVE ng/mL — AB (ref <100)
Buprenorphine, Urine: NEGATIVE ng/mL (ref <5)
Cocaine Metabolite: NEGATIVE ng/mL (ref <150)
Codeine: NEGATIVE ng/mL (ref <50)
Creatinine: 133.7 mg/dL (ref >=20.0)
Hydrocodone: 216 ng/mL — ABNORMAL HIGH (ref <50)
Hydromorphone: 95 ng/mL — ABNORMAL HIGH (ref <50)
Hydroxyethylflurazepam: NEGATIVE ng/mL (ref <50)
Lorazepam: NEGATIVE ng/mL (ref <50)
MDMA: NEGATIVE ng/mL (ref <500)
Marijuana Metabolite: 1874 ng/mL — ABNORMAL HIGH (ref <5)
Marijuana Metabolite: POSITIVE ng/mL — AB (ref <20)
Morphine: NEGATIVE ng/mL (ref <50)
Nordiazepam: NEGATIVE ng/mL (ref <50)
Norhydrocodone: 744 ng/mL — ABNORMAL HIGH (ref <50)
Opiates: POSITIVE ng/mL — AB (ref <100)
Oxazepam: NEGATIVE ng/mL (ref <50)
Oxidant: NEGATIVE ug/mL (ref <200)
Oxycodone: NEGATIVE ng/mL (ref <100)
Temazepam: NEGATIVE ng/mL (ref <50)
pH: 6.5 (ref 4.5–9.0)

## 2024-05-10 LAB — DM TEMPLATE

## 2024-05-11 ENCOUNTER — Other Ambulatory Visit (HOSPITAL_COMMUNITY): Payer: Self-pay

## 2024-05-11 NOTE — Telephone Encounter (Signed)
 Pharmacy Patient Advocate Encounter  Received notification from [facility] that Prior Authorization for Norco 5 has been DENIED.  No reason given; No denial letter received via Fax or CMM. It has been requested and will be uploaded to the media tab once received.   PA #/Case ID/Reference #: [id number]

## 2024-05-11 NOTE — Telephone Encounter (Signed)
 Pt is aware of this from previous denial. She pays out of pocket.

## 2024-05-14 ENCOUNTER — Other Ambulatory Visit (HOSPITAL_COMMUNITY): Payer: Self-pay

## 2024-05-28 ENCOUNTER — Encounter

## 2024-05-31 ENCOUNTER — Other Ambulatory Visit: Payer: Self-pay | Admitting: Internal Medicine

## 2024-05-31 MED ORDER — CLONAZEPAM 1 MG PO TABS: 0.5000 mg | ORAL_TABLET | Freq: Two times a day (BID) | ORAL | 0 refills | Status: DC | PRN

## 2024-05-31 MED ORDER — HYDROCODONE-ACETAMINOPHEN 5-325 MG PO TABS: 1.0000 | ORAL_TABLET | Freq: Four times a day (QID) | ORAL | 0 refills | Status: DC | PRN

## 2024-05-31 NOTE — Telephone Encounter (Signed)
 Name of Medication: Hydrocodone  Name of Pharmacy: [facility] Last Iva or Written Date and Quantity: 05-04-24 #75 Last Office Visit and Type: 05-08-24 Next Office Visit and Type: 08-06-24 Last Controlled Substance Agreement Date: 05-08-24 Last UDS: 05-08-24

## 2024-05-31 NOTE — Telephone Encounter (Signed)
 Copied from CRM [id number]. Topic: Clinical - Medication Refill >> May 31, 2024 12:16 PM Donna E wrote: Medication: HYDROcodone -acetaminophen  (NORCO/VICODIN) 5-325 MG tablet  Has the patient contacted their pharmacy? No pharmacy always states to call provider  This is the patient's preferred pharmacy:   [facility] [address] [facility] - [place], [place] - [address] [facility] [facility] [address] [facility] [address] [place] [address] Phone: [phone] Fax: [phone]  Is this the correct pharmacy for this prescription? Yes If no, delete pharmacy and type the correct one.   Has the prescription been filled recently? Yes  Is the patient out of the medication? Yes  Has the patient been seen for an appointment in the last year OR does the patient have an upcoming appointment? Yes  Can we respond through MyChart? No  Agent: Please be advised that Rx refills may take up to 3 business days. We ask that you follow-up with your pharmacy.

## 2024-05-31 NOTE — Telephone Encounter (Signed)
 Last written 05-03-24 #60 Last OV 05-08-24 Next OV 08-06-24 [facility]

## 2024-05-31 NOTE — Telephone Encounter (Signed)
 Copied from CRM [id number]. Topic: Clinical - Medication Refill >> May 31, 2024 12:10 PM Donna E wrote: Medication:  clonazePAM  (KLONOPIN ) 1 MG tablet    Has the patient contacted their pharmacy? No pharmacy always states to call provider   This is the patient's preferred pharmacy:   [facility] [address] [place] - [address] [place] [address] Phone: [phone] Fax: [phone]   Is this the correct pharmacy for this prescription? Yes If no, delete pharmacy and type the correct one.   Has the prescription been filled recently? Yes  Is the patient out of the medication? Yes  Has the patient been seen for an appointment in the last year OR does the patient have an upcoming appointment? Yes  Can we respond through MyChart? No  Agent: Please be advised that Rx refills may take up to 3 business days. We ask that you follow-up with your pharmacy.

## 2024-06-28 ENCOUNTER — Other Ambulatory Visit: Payer: Self-pay

## 2024-06-28 NOTE — Telephone Encounter (Signed)
 Name of Medication: Hydrocodone  Name of Pharmacy: [facility] Last Fill or Written Date and Quantity: 05-31-24 #75 Last Office Visit and Type: 05-08-24 Next Office Visit and Type: 08-06-24 Last Controlled Substance Agreement Date: 05-08-24 Last UDS: 05-08-24        Alprazolam last filled 05-31-24 #60 [facility]

## 2024-06-28 NOTE — Telephone Encounter (Signed)
 Gave pt phone number to [facility] to set up appointment.

## 2024-06-29 ENCOUNTER — Telehealth: Payer: Self-pay

## 2024-06-29 MED ORDER — HYDROCODONE-ACETAMINOPHEN 5-325 MG PO TABS
1.0000 | ORAL_TABLET | Freq: Four times a day (QID) | ORAL | 0 refills | Status: DC | PRN
Start: 2024-06-29 — End: 2024-06-29

## 2024-06-29 MED ORDER — CLONAZEPAM 1 MG PO TABS: 0.5000 mg | ORAL_TABLET | Freq: Two times a day (BID) | ORAL | 0 refills | Status: DC | PRN

## 2024-06-29 MED ORDER — HYDROCODONE-ACETAMINOPHEN 5-325 MG PO TABS
1.0000 | ORAL_TABLET | Freq: Four times a day (QID) | ORAL | 0 refills | Status: DC | PRN
Start: 2024-06-29 — End: 2024-08-03

## 2024-06-29 NOTE — Telephone Encounter (Signed)
 Copied from CRM [id number]. Topic: Referral - Status >> Jun 29, 2024  8:40 AM Mia F wrote: Reason for CRM: Physiatry called stating that the have received 3 referrals for pt. They have tried to contact pt each time but was not able to get ahold of her but they do not accept pt insurance.

## 2024-06-29 NOTE — Telephone Encounter (Signed)
 Copied from CRM [id number]. Topic: Clinical - Prescription Issue >> Jun 29, 2024 10:55 AM Armenia J wrote: Reason for CRM: Patient's HYDROcodone -acetaminophen  (NORCO/VICODIN) 5-325 MG tablet was sent to the incorrect pharmacy. [facility]  Patient is aware of the same day turn around time and would like some kind of update on whether or not this can be completed today.

## 2024-06-29 NOTE — Telephone Encounter (Signed)
 She now has [employer] but we do not have a copy of it yet.

## 2024-06-29 NOTE — Addendum Note (Signed)
 Addended by: JIMMY ADE I on: 06/29/2024 01:08 PM   Modules accepted: Orders

## 2024-06-29 NOTE — Telephone Encounter (Signed)
 Spoke to pt. Gave her their information to call and check.

## 2024-07-31 ENCOUNTER — Ambulatory Visit: Admitting: Dermatology

## 2024-08-03 ENCOUNTER — Other Ambulatory Visit: Payer: Self-pay | Admitting: Internal Medicine

## 2024-08-03 ENCOUNTER — Telehealth: Payer: Self-pay | Admitting: Nurse Practitioner

## 2024-08-03 NOTE — Telephone Encounter (Signed)
 Copied from CRM #[id number]. Topic: Clinical - Medication Refill >> Aug 03, 2024  4:43 PM Shereese L wrote: Medication:HYDROcodone -acetaminophen  (NORCO/VICODIN) 5-325 MG tablet  clonazePAM  (KLONOPIN ) 1 MG tablet   Has the patient contacted their pharmacy? Yes (Agent: If no, request that the patient contact the pharmacy for the refill. If patient does not wish to contact the pharmacy document the reason why and proceed with request.) (Agent: If yes, when and what did the pharmacy advise?)  This is the patient's preferred pharmacy:   clonazePAM  (KLONOPIN ) 1 MG tablet Goes to [facility] [address] [place] - [address] [place] [address] Phone: [phone] Fax: [phone]  HYDROcodone -acetaminophen  (NORCO/VICODIN) 5-325 MG tablet [facility] [address] [place], [place] - [address] AT [facility] [address] [place] [address] Phone: [phone] Fax: [phone]  Is this the correct pharmacy for this prescription? Yes If no, delete pharmacy and type the correct one.   Has the prescription been filled recently? Yes  Is the patient out of the medication? Yes  Has the patient been seen for an appointment in the last year OR does the patient have an upcoming appointment? Yes  Can we respond through MyChart? Yes  Agent: Please be advised that Rx refills may take up to 3 business days. We ask that you follow-up with your pharmacy.

## 2024-08-03 NOTE — Telephone Encounter (Unsigned)
 Copied from CRM #[id number]. Topic: Clinical - Medication Refill >> Aug 03, 2024  4:43 PM Shereese L wrote: Medication:HYDROcodone -acetaminophen  (NORCO/VICODIN) 5-325 MG tablet  clonazePAM  (KLONOPIN ) 1 MG tablet   Has the patient contacted their pharmacy? Yes (Agent: If no, request that the patient contact the pharmacy for the refill. If patient does not wish to contact the pharmacy document the reason why and proceed with request.) (Agent: If yes, when and what did the pharmacy advise?)  This is the patient's preferred pharmacy:   clonazePAM  (KLONOPIN ) 1 MG tablet Goes to [facility] [address] [place] - [address] [place] [address] Phone: [phone] Fax: [phone]  HYDROcodone -acetaminophen  (NORCO/VICODIN) 5-325 MG tablet [facility] [address] [place], [place] - [address] AT [facility] [address] [place] [address] Phone: [phone] Fax: [phone]  Is this the correct pharmacy for this prescription? Yes If no, delete pharmacy and type the correct one.   Has the prescription been filled recently? Yes  Is the patient out of the medication? Yes  Has the patient been seen for an appointment in the last year OR does the patient have an upcoming appointment? Yes  Can we respond through MyChart? Yes  Agent: Please be advised that Rx refills may take up to 3 business days. We ask that you follow-up with your pharmacy.

## 2024-08-05 MED ORDER — HYDROCODONE-ACETAMINOPHEN 5-325 MG PO TABS: 1.0000 | ORAL_TABLET | Freq: Four times a day (QID) | ORAL | 0 refills | Status: DC | PRN

## 2024-08-05 MED ORDER — CLONAZEPAM 1 MG PO TABS: 0.5000 mg | ORAL_TABLET | Freq: Two times a day (BID) | ORAL | 0 refills | Status: DC | PRN

## 2024-08-06 ENCOUNTER — Other Ambulatory Visit: Payer: Self-pay | Admitting: Family

## 2024-08-06 ENCOUNTER — Encounter: Admitting: Nurse Practitioner

## 2024-08-06 NOTE — Progress Notes (Deleted)
   Established Patient Office Visit  Subjective   Patient ID: RASHEEMA TRULUCK, female    DOB: 1966/07/13  Age: 58 y.o. MRN: [id number]  No chief complaint on file.   HPI  Mood disorder: Patient currently maintained on Effexor  225 mg daily along with Klonopin  1 mg half tablet to 1 tablet twice daily as needed  Insomnia: Currently maintained on trazodone  100 mg nightly along with Klonopin  0.5 to 1 mg twice daily  GERD: Currently maintained on omeprazole  20 mg daily  Chronic pain: Currently maintained on gabapentin  200 mg 3 times daily along with hydrocodone  1 tablet every 6 hours as needed  Colonoscopy: 06/07/2016, repeat 10 years patient due in 2027 Pap smear Mammogram  Tdap: 2023 Flu: Shingles: Up-to-date PNA:  {History (Optional):23778}  ROS    Objective:     LMP 07/24/2015  {Vitals History (Optional):23777}  Physical Exam   No results found for any visits on 08/06/24.  {Labs (Optional):23779}  The 10-year ASCVD risk score (Arnett DK, et al., 2019) is: 7.7%    Assessment & Plan:   Problem List Items Addressed This Visit   None   No follow-ups on file.    Adina Crandall, NP

## 2024-08-06 NOTE — Telephone Encounter (Signed)
 Fyi in case you were wondiering

## 2024-08-06 NOTE — Telephone Encounter (Signed)
 Copied from CRM #[id number]. Topic: Clinical - Medication Refill >> Aug 03, 2024  4:43 PM Shereese L wrote: Medication:HYDROcodone -acetaminophen  (NORCO/VICODIN) 5-325 MG tablet  clonazePAM  (KLONOPIN ) 1 MG tablet   Has the patient contacted their pharmacy? Yes (Agent: If no, request that the patient contact the pharmacy for the refill. If patient does not wish to contact the pharmacy document the reason why and proceed with request.) (Agent: If yes, when and what did the pharmacy advise?)  This is the patient's preferred pharmacy:   clonazePAM  (KLONOPIN ) 1 MG tablet Goes to [facility] [address] [place] - [address] [place] [address] Phone: [phone] Fax: [phone]  HYDROcodone -acetaminophen  (NORCO/VICODIN) 5-325 MG tablet [facility] [address] [place], [place] - [address] AT [facility] [address] [place] [address] Phone: [phone] Fax: [phone]  Is this the correct pharmacy for this prescription? Yes If no, delete pharmacy and type the correct one.   Has the prescription been filled recently? Yes  Is the patient out of the medication? Yes  Has the patient been seen for an appointment in the last year OR does the patient have an upcoming appointment? Yes  Can we respond through MyChart? Yes  Agent: Please be advised that Rx refills may take up to 3 business days. We ask that you follow-up with your pharmacy.

## 2024-08-07 ENCOUNTER — Ambulatory Visit: Admitting: Cardiology

## 2024-08-08 ENCOUNTER — Other Ambulatory Visit (HOSPITAL_COMMUNITY): Payer: Self-pay

## 2024-08-13 ENCOUNTER — Other Ambulatory Visit (HOSPITAL_COMMUNITY): Payer: Self-pay

## 2024-08-14 ENCOUNTER — Ambulatory Visit: Payer: Self-pay

## 2024-08-14 MED ORDER — HYDROCODONE-ACETAMINOPHEN 5-325 MG PO TABS: 1.0000 | ORAL_TABLET | Freq: Four times a day (QID) | ORAL | 0 refills | Status: DC | PRN

## 2024-08-14 NOTE — Telephone Encounter (Signed)
 Copied from CRM [id number]. Topic: Clinical - Prescription Issue >> Aug 14, 2024  3:32 PM Corin V wrote: Reason for CRM: Patient stated that the Vicodin prescription was sent to [facility] incorrectly and was cancelled due to it being the wrong pharmacy and has not [facility] [address] - [place], [place] - [address] [facility] [address]  She is completely out of the Vicodin now and needs [facility] to [facility] as soon as possible.

## 2024-08-14 NOTE — Telephone Encounter (Signed)
 FYI Only or Action Required?: FYI only for provider.  Patient was last seen in primary care on 05/08/2024 by Jimmy Charlie FERNS, MD.  Called Nurse Triage reporting Fall and Back Pain.  Symptoms began yesterday.  Interventions attempted: Prescription medications: vicodin.  Symptoms are: gradually improving.  Triage Disposition: Home Care  Patient/caregiver understands and will follow disposition?: Yes  Copied from CRM #[id number]. Topic: Clinical - Red Word Triage >> Aug 14, 2024  3:34 PM Corin V wrote: [facility] that prompted transfer to Nurse Triage: Patient had a slip and fall on the stairs last night. She hit her bottom and jammed up her back. Reason for Disposition  Back pain  Answer Assessment - Initial Assessment Questions 1. ONSET: When did the pain begin? (e.g., minutes, hours, days)     One day ago, fall on steps, landed on bottom  2. LOCATION: Where does it hurt? (upper, mid or lower back)     Low back and between shoulder blades 3. SEVERITY: How bad is the pain?  (e.g., Scale 1-10; mild, moderate, or severe)     Minimally worsened by fall, but improving 4. PATTERN: Is the pain constant? (e.g., yes, no; constant, intermittent)      Dependent on activity 5. RADIATION: Does the pain shoot into your legs or somewhere else?     denies 6. CAUSE:  What do you think is causing the back pain?      Underlying back pain worsened by fall 7. BACK OVERUSE:  Any recent lifting of heavy objects, strenuous work or exercise?     Recent fall 8. MEDICINES: What have you taken so far for the pain? (e.g., nothing, acetaminophen , NSAIDS)     vicodin 9. NEUROLOGIC SYMPTOMS: Do you have any weakness, numbness, or problems with bowel/bladder control?     denies 10. OTHER SYMPTOMS: Do you have any other symptoms? (e.g., fever, abdomen pain, burning with urination, blood in urine)       denies 11. PREGNANCY: Is there any chance you are pregnant? When was your last  menstrual period?       N/a  Protocols used: Back Pain-A-AH

## 2024-08-15 NOTE — Telephone Encounter (Signed)
 Noted

## 2024-09-07 ENCOUNTER — Ambulatory Visit: Attending: Cardiology | Admitting: Cardiology

## 2024-09-10 ENCOUNTER — Other Ambulatory Visit: Payer: Self-pay | Admitting: Nurse Practitioner

## 2024-09-10 NOTE — Telephone Encounter (Unsigned)
 Copied from CRM #[id number]. Topic: Clinical - Medication Refill >> Sep 10, 2024  1:19 PM Thersia C wrote: Medication: HYDROcodone -acetaminophen  (NORCO/VICODIN) 5-325 MG tablet --- [facility]    clonazePAM  (KLONOPIN ) 1 MG tablet --- medication [facility] to be sent to [facility]     Has the patient contacted their pharmacy? Yes (Agent: If no, request that the patient contact the pharmacy for the refill. If patient does not wish to contact the pharmacy document the reason why and proceed with request.) (Agent: If yes, when and what did the pharmacy advise?)  This is the patient's preferred pharmacy:  [facility] [address] [place] - [address] [place] [address] Phone: [phone] Fax: [phone]  [facility] [address] [place] - [place], [place] - [address] [facility] [address] [place] [address] Phone: [phone] Fax: [phone]  Is this the correct pharmacy for this prescription? Yes If no, delete pharmacy and type the correct one.   Has the prescription been filled recently? No  Is the patient out of the medication? Yes  Has the patient been seen for an appointment in the last year OR does the patient have an upcoming appointment? Yes  Can we respond through MyChart? Yes  Agent: Please be advised that Rx refills may take up to 3 business days. We ask that you follow-up with your pharmacy.

## 2024-09-11 ENCOUNTER — Other Ambulatory Visit: Payer: Self-pay | Admitting: Family

## 2024-09-11 MED ORDER — HYDROCODONE-ACETAMINOPHEN 5-325 MG PO TABS: 1.0000 | ORAL_TABLET | Freq: Four times a day (QID) | ORAL | 0 refills | Status: DC | PRN

## 2024-09-11 MED ORDER — CLONAZEPAM 1 MG PO TABS: 0.5000 mg | ORAL_TABLET | Freq: Two times a day (BID) | ORAL | 0 refills | Status: DC | PRN

## 2024-10-01 ENCOUNTER — Encounter: Admitting: Dermatology

## 2024-10-09 ENCOUNTER — Encounter: Admitting: Nurse Practitioner

## 2024-10-15 ENCOUNTER — Telehealth: Payer: Self-pay

## 2024-10-15 MED ORDER — HYDROCODONE-ACETAMINOPHEN 5-325 MG PO TABS: 1.0000 | ORAL_TABLET | Freq: Four times a day (QID) | ORAL | 0 refills | Status: DC | PRN

## 2024-10-15 MED ORDER — CLONAZEPAM 1 MG PO TABS: 0.5000 mg | ORAL_TABLET | Freq: Two times a day (BID) | ORAL | 0 refills | Status: DC | PRN

## 2024-10-15 NOTE — Addendum Note (Signed)
 Addended by: WENDEE LYNWOOD HERO on: 10/15/2024 05:14 PM   Modules accepted: Orders

## 2024-10-15 NOTE — Telephone Encounter (Signed)
 Her appt was last Tuesday 10-09-24 with Adina Crandall, but he was out of the office. Will forward to [facility] to see if he will refill her medications for her.

## 2024-10-15 NOTE — Telephone Encounter (Signed)
 Can we see if she can be moved up to next month? Any slot is fine  I have refilled the medication for a month

## 2024-10-15 NOTE — Telephone Encounter (Signed)
 Copied from CRM #[id number]. Topic: Clinical - Medical Advice >> Oct 15, 2024  2:12 PM Victoria A wrote: Reason for CRM: Patient had appointment scheduled for Tuesday with Lynwood Crandall but he will not be in on that date. Patients appointment was rescheduled for 12/13/23. Patient is out of medications and needs medications clonazePAM  (KLONOPIN ) 1 MG tablet and HYDROcodone -acetaminophen  (NORCO/VICODIN) 5-325 MG tablet. [facility] using [facility] [address] Can there be scripts written to cover her until appointment  and patient requests to be called to let her know what will happen regarding medications [phone] (M)

## 2024-10-16 NOTE — Telephone Encounter (Signed)
 Called and informed pt of prescriptions sent to pharmacy. Pt verbalized understanding and has no questions or concerns.

## 2024-10-31 ENCOUNTER — Ambulatory Visit (INDEPENDENT_AMBULATORY_CARE_PROVIDER_SITE_OTHER): Admitting: Nurse Practitioner

## 2024-10-31 ENCOUNTER — Encounter: Payer: Self-pay | Admitting: Nurse Practitioner

## 2024-10-31 VITALS — BP 122/84 | HR 72 | Temp 98.0°F | Ht 61.0 in | Wt 128.6 lb

## 2024-10-31 DIAGNOSIS — G8929 Other chronic pain: Secondary | ICD-10-CM

## 2024-10-31 DIAGNOSIS — M549 Dorsalgia, unspecified: Secondary | ICD-10-CM | POA: Diagnosis not present

## 2024-10-31 DIAGNOSIS — F411 Generalized anxiety disorder: Secondary | ICD-10-CM | POA: Diagnosis not present

## 2024-10-31 DIAGNOSIS — R221 Localized swelling, mass and lump, neck: Secondary | ICD-10-CM | POA: Diagnosis not present

## 2024-10-31 DIAGNOSIS — F332 Major depressive disorder, recurrent severe without psychotic features: Secondary | ICD-10-CM | POA: Diagnosis not present

## 2024-10-31 DIAGNOSIS — F1120 Opioid dependence, uncomplicated: Secondary | ICD-10-CM | POA: Diagnosis not present

## 2024-10-31 DIAGNOSIS — K219 Gastro-esophageal reflux disease without esophagitis: Secondary | ICD-10-CM | POA: Diagnosis not present

## 2024-10-31 DIAGNOSIS — Z23 Encounter for immunization: Secondary | ICD-10-CM | POA: Diagnosis not present

## 2024-10-31 DIAGNOSIS — R1319 Other dysphagia: Secondary | ICD-10-CM

## 2024-10-31 DIAGNOSIS — R0609 Other forms of dyspnea: Secondary | ICD-10-CM | POA: Diagnosis not present

## 2024-10-31 MED ORDER — ARIPIPRAZOLE 2 MG PO TABS: 2.0000 mg | ORAL_TABLET | Freq: Every day | ORAL | 2 refills | Status: AC

## 2024-10-31 MED ORDER — ADVAIR DISKUS 250-50 MCG/ACT IN AEPB: 1.0000 | INHALATION_SPRAY | Freq: Two times a day (BID) | RESPIRATORY_TRACT | 0 refills | Status: AC

## 2024-10-31 MED ORDER — ALBUTEROL SULFATE HFA 108 (90 BASE) MCG/ACT IN AERS: 2.0000 | INHALATION_SPRAY | Freq: Four times a day (QID) | RESPIRATORY_TRACT | 1 refills | Status: DC | PRN

## 2024-10-31 NOTE — Assessment & Plan Note (Signed)
 Improve with the addition of omeprazole  20 mg daily.  Continue

## 2024-10-31 NOTE — Assessment & Plan Note (Signed)
 Currently maintained on hydrocodone  5-325 mg every 6 hours as needed.

## 2024-10-31 NOTE — Assessment & Plan Note (Signed)
 Will schedule cardiology but unable to make appointment due to transportation.  Patient is a smoker.  States albuterol  does help we will put patient on Advair 250-51 puff twice daily and rinse mouth out after each use.  And refill albuterol  inhaler.  Information given to call and schedule with cardiology

## 2024-10-31 NOTE — Assessment & Plan Note (Signed)
 Lymph node been present approximately 8 months per patient report.  Hard firm none mobile will order ultrasound to assess.  Patient feels like it has grown over the past 8 months.  History of smoking

## 2024-10-31 NOTE — Assessment & Plan Note (Signed)
 History of the same.  Patient currently maintained on Effexor  and Klonopin .  Patient asked for increase in Klonopin  I probably declined.  Will add on Abilify .  Ambulatory furl to psychiatry

## 2024-10-31 NOTE — Assessment & Plan Note (Signed)
 Greatly improved with the addition of omeprazole  20 mg daily.  Continue

## 2024-10-31 NOTE — Progress Notes (Signed)
 Established Patient Office Visit  Subjective   Patient ID: Heather Russo, female    DOB: 11-28-1965  Age: 58 y.o. MRN: [id number]  Chief Complaint  Patient presents with   Follow-up    Pt complains of no improvement since last visit.    Medication Refill    Albuterol        Chronic low back pain: history of the same. She has not had any surgeries in the past. She will get pain in the lower back with prolonged standing and will get a sharp pain under right shoulder blade. She will hurt with prolonged sitting. States that she has not heard from pMR  Anxiety: effexor  and klonopin . She was followed by psychiatry but no longer. She does have some anxiety and depressoin. She does worry about all the things She odes have some trouble with getting over the things in the past. Has been on the Prozac  in the past. States that she tried buspar  and it did not do great.   Gerd/ dysphagia: states that she is not having any trouble with swallowing. States that she does not have to change her diet   Insomnia: she is currently on trazodone  and it will help. She will wake up several times through the night around 2 and 4am. She will get up for the day around 8  Shortness of breath: states that she was referred to cardiology. She is having transportation issues and having to borrow. States that she is having DOE with exterion. States that she has tried the albuterol  inhaler before and it helps. She does use it twice a day. States that she will get a pain that makes it hard to breath. She can lean over and take slow breaths that will make it go away      10/31/2024    4:10 PM 05/08/2024   11:06 AM 04/21/2023   11:42 AM  PHQ9 SCORE ONLY  PHQ-9 Total Score 25 21  17       Data saved with a previous flowsheet row definition       10/31/2024    4:11 PM  GAD 7 : Generalized Anxiety Score  Nervous, Anxious, on Edge 3  Control/stop worrying 3  Worry too much - different things 3  Trouble relaxing 3   Restless 2  Easily annoyed or irritable 3  Afraid - awful might happen 3  Total GAD 7 Score 20  Anxiety Difficulty Somewhat difficult       Review of Systems  Constitutional:  Negative for chills and fever.  Respiratory:  Negative for shortness of breath.   Cardiovascular:  Negative for chest pain and leg swelling.  Gastrointestinal:  Negative for abdominal pain, blood in stool, constipation, diarrhea, nausea and vomiting.       BM daily for a while then skip 3 days and then back to normal   Genitourinary:  Negative for dysuria and hematuria.  Neurological:  Negative for dizziness, tingling and headaches.  Psychiatric/Behavioral:  Negative for hallucinations and suicidal ideas.       Objective:     BP 122/84   Pulse 72   Temp 98 F (36.7 C) (Oral)   Ht 5' 1 (1.549 m)   Wt 128 lb 9.6 oz (58.3 kg)   LMP 07/24/2015   SpO2 99%   BMI 24.30 kg/m  BP Readings from Last 3 Encounters:  10/31/24 122/84  05/08/24 120/78  12/28/23 100/70   Wt Readings from Last 3 Encounters:  10/31/24 128 lb  9.6 oz (58.3 kg)  05/08/24 130 lb (59 kg)  12/28/23 132 lb (59.9 kg)   SpO2 Readings from Last 3 Encounters:  10/31/24 99%  05/08/24 96%  12/28/23 97%      Physical Exam Vitals and nursing note reviewed.  Constitutional:      Appearance: Normal appearance.  Neck:   Cardiovascular:     Rate and Rhythm: Normal rate and regular rhythm.     Heart sounds: Normal heart sounds.  Pulmonary:     Effort: Pulmonary effort is normal.     Breath sounds: Normal breath sounds.  Abdominal:     General: Bowel sounds are normal.  Lymphadenopathy:     Cervical: Cervical adenopathy present.  Neurological:     Mental Status: She is alert.  Psychiatric:        Attention and Perception: Attention normal.        Mood and Affect: Mood is anxious and depressed. Affect is tearful.        Speech: Speech normal.        Behavior: Behavior normal.        Thought Content: Thought content is  delusional. Thought content does not include homicidal or suicidal ideation. Thought content does not include homicidal or suicidal plan.        Cognition and Memory: Cognition normal.        Judgment: Judgment normal.      No results found for any visits on 10/31/24.    The 10-year ASCVD risk score (Arnett DK, et al., 2019) is: 8.3%    Assessment & Plan:   Problem List Items Addressed This Visit       Digestive   GERD   Improve with the addition of omeprazole  20 mg daily.  Continue      Esophageal dysphagia   Greatly improved with the addition of omeprazole  20 mg daily.  Continue        Other   Chronic back pain   Currently maintained on hydrocodone  5-3 25.  Patient has not had any surgeries.  Stable at this juncture continue      Narcotic dependence (HCC)   Currently maintained on hydrocodone  5-325 mg every 6 hours as needed.      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  Patient controlled.  Has tried therapy in the past without great result.  Will refer to psychiatry.  Patient having passive SI to discuss if she becomes active seek immediate help with behavioral health urgent care.  Will add on Abilify  2 mg daily      Relevant Medications   ARIPiprazole  (ABILIFY ) 2 MG tablet   Other Relevant Orders   Ambulatory referral to Psychiatry   Dyspnea on exertion   Will schedule cardiology but unable to make appointment due to transportation.  Patient is a smoker.  States albuterol  does help we will put patient on Advair 250-51 puff twice daily and rinse mouth out after each use.  And refill albuterol  inhaler.  Information given to call and schedule with cardiology      Relevant Medications   albuterol  (VENTOLIN  HFA) 108 (90 Base) MCG/ACT inhaler   Mass of neck   Lymph node been present approximately 8 months per patient report.  Hard firm none mobile  will order ultrasound to assess.  Patient feels like it has grown over the past 8 months.  History of smoking  Relevant Orders   US  Soft Tissue Head/Neck (NON-THYROID )   GAD (generalized anxiety disorder)   History of the same.  Patient currently maintained on Effexor  and Klonopin .  Patient asked for increase in Klonopin  I probably declined.  Will add on Abilify .  Ambulatory furl to psychiatry      Relevant Orders   Ambulatory referral to Psychiatry   Other Visit Diagnoses       Need for influenza vaccination       Relevant Orders   Flu vaccine trivalent PF, 6mos and older(Flulaval,Afluria,Fluarix,Fluzone) (Completed)       Return if symptoms worsen or fail to improve, for As scheduled .    Adina Crandall, NP

## 2024-10-31 NOTE — Assessment & Plan Note (Signed)
 Currently maintained on hydrocodone  5-3 25.  Patient has not had any surgeries.  Stable at this juncture continue

## 2024-10-31 NOTE — Assessment & Plan Note (Signed)
 History of the same currently maintained on venlafaxine .  Patient controlled.  Has tried therapy in the past without great result.  Will refer to psychiatry.  Patient having passive SI to discuss if she becomes active seek immediate help with behavioral health urgent care.  Will add on Abilify  2 mg daily

## 2024-10-31 NOTE — Patient Instructions (Addendum)
 Nice to see you today  Keep your appointment with me as scheduled Follow up sooner if you need me   Call and schedule the ultrasound at   [facility] - off [facility] [address] [place] , [place] [address] [phone] Open 8am-5pm   Call and schedule with cardiology at   [phone]

## 2024-11-02 ENCOUNTER — Other Ambulatory Visit (HOSPITAL_COMMUNITY): Payer: Self-pay

## 2024-11-02 ENCOUNTER — Telehealth: Payer: Self-pay

## 2024-11-02 NOTE — Telephone Encounter (Signed)
 Pharmacy Patient Advocate Encounter   Received notification from Onbase that prior authorization for Abilify  2 tabs is required/requested.   Insurance verification completed.   The patient is insured through [employer].   Per test claim: PA required; PA submitted to above mentioned insurance via Latent Key/confirmation #/EOC [facility] Status is pending

## 2024-11-08 ENCOUNTER — Ambulatory Visit: Attending: Nurse Practitioner

## 2024-11-12 ENCOUNTER — Other Ambulatory Visit (HOSPITAL_COMMUNITY): Payer: Self-pay

## 2024-11-12 NOTE — Telephone Encounter (Signed)
 Pharmacy Patient Advocate Encounter  Received notification from [employer] that Prior Authorization for Abilify  2 tabs has been APPROVED from 11/02/24 to 11/02/25. Unable to obtain price due to refill too soon rejection, last fill date 11/09/24 next available fill date01/11/26   PA #/Case ID/Reference #: # [id number]

## 2024-11-12 NOTE — Telephone Encounter (Signed)
 Spoke with patient and relayed information. No questions or concerns at this time.

## 2024-11-19 ENCOUNTER — Other Ambulatory Visit: Payer: Self-pay | Admitting: Nurse Practitioner

## 2024-11-19 MED ORDER — CLONAZEPAM 1 MG PO TABS: 0.5000 mg | ORAL_TABLET | Freq: Two times a day (BID) | ORAL | 0 refills | Status: DC | PRN

## 2024-11-19 MED ORDER — HYDROCODONE-ACETAMINOPHEN 5-325 MG PO TABS: 1.0000 | ORAL_TABLET | Freq: Four times a day (QID) | ORAL | 0 refills | Status: DC | PRN

## 2024-11-19 NOTE — Telephone Encounter (Signed)
 Copied from CRM #[id number]. Topic: Clinical - Medication Refill >> Nov 19, 2024  2:56 PM Thersia C wrote: Medication: HYDROcodone -acetaminophen  (NORCO/VICODIN) 5-325 MG tablet clonazePAM  (KLONOPIN ) 1 MG tablet  Has the patient contacted their pharmacy? Yes (Agent: If no, request that the patient contact the pharmacy for the refill. If patient does not wish to contact the pharmacy document the reason why and proceed with request.) (Agent: If yes, when and what did the pharmacy advise?)  This is the patient's preferred pharmacy:   [facility] [address] [facility] - [place], [place] - [address] [facility] [address] [place] [address] Phone: [phone] Fax: [phone]  Is this the correct pharmacy for this prescription? Yes If no, delete pharmacy and type the correct one.   Has the prescription been filled recently? No  Is the patient out of the medication? Yes  Has the patient been seen for an appointment in the last year OR does the patient have an upcoming appointment? Yes  Can we respond through MyChart? Yes  Agent: Please be advised that Rx refills may take up to 3 business days. We ask that you follow-up with your pharmacy.

## 2024-12-06 ENCOUNTER — Other Ambulatory Visit: Payer: Self-pay | Admitting: *Deleted

## 2024-12-12 ENCOUNTER — Ambulatory Visit

## 2024-12-12 ENCOUNTER — Ambulatory Visit: Admitting: Nurse Practitioner

## 2024-12-12 ENCOUNTER — Telehealth: Payer: Self-pay | Admitting: Nurse Practitioner

## 2024-12-12 ENCOUNTER — Encounter: Payer: Self-pay | Admitting: Nurse Practitioner

## 2024-12-12 VITALS — BP 112/82 | HR 71 | Temp 98.0°F | Ht 60.5 in | Wt 123.8 lb

## 2024-12-12 DIAGNOSIS — R634 Abnormal weight loss: Secondary | ICD-10-CM

## 2024-12-12 DIAGNOSIS — R0609 Other forms of dyspnea: Secondary | ICD-10-CM | POA: Diagnosis not present

## 2024-12-12 DIAGNOSIS — F329 Major depressive disorder, single episode, unspecified: Secondary | ICD-10-CM

## 2024-12-12 DIAGNOSIS — M545 Low back pain, unspecified: Secondary | ICD-10-CM | POA: Diagnosis not present

## 2024-12-12 DIAGNOSIS — F17210 Nicotine dependence, cigarettes, uncomplicated: Secondary | ICD-10-CM

## 2024-12-12 DIAGNOSIS — G8929 Other chronic pain: Secondary | ICD-10-CM

## 2024-12-12 DIAGNOSIS — K219 Gastro-esophageal reflux disease without esophagitis: Secondary | ICD-10-CM

## 2024-12-12 DIAGNOSIS — R1319 Other dysphagia: Secondary | ICD-10-CM

## 2024-12-12 DIAGNOSIS — F411 Generalized anxiety disorder: Secondary | ICD-10-CM

## 2024-12-12 DIAGNOSIS — Z1231 Encounter for screening mammogram for malignant neoplasm of breast: Secondary | ICD-10-CM

## 2024-12-12 DIAGNOSIS — F1120 Opioid dependence, uncomplicated: Secondary | ICD-10-CM

## 2024-12-12 LAB — COMPREHENSIVE METABOLIC PANEL WITH GFR
ALT: 11 U/L (ref 3–35)
AST: 15 U/L (ref 5–37)
Albumin: 4.2 g/dL (ref 3.5–5.2)
Alkaline Phosphatase: 73 U/L (ref 39–117)
BUN: 11 mg/dL (ref 6–23)
CO2: 30 meq/L (ref 19–32)
Calcium: 9.4 mg/dL (ref 8.4–10.5)
Chloride: 104 meq/L (ref 96–112)
Creatinine, Ser: 0.88 mg/dL (ref 0.40–1.20)
GFR: 72.05 mL/min
Glucose, Bld: 102 mg/dL — ABNORMAL HIGH (ref 70–99)
Potassium: 4 meq/L (ref 3.5–5.1)
Sodium: 140 meq/L (ref 135–145)
Total Bilirubin: 0.3 mg/dL (ref 0.2–1.2)
Total Protein: 6.6 g/dL (ref 6.0–8.3)

## 2024-12-12 LAB — CBC
HCT: 44.5 % (ref 36.0–46.0)
Hemoglobin: 14.9 g/dL (ref 12.0–15.0)
MCHC: 33.6 g/dL (ref 30.0–36.0)
MCV: 90.9 fl (ref 78.0–100.0)
Platelets: 303 K/uL (ref 150.0–400.0)
RBC: 4.89 Mil/uL (ref 3.87–5.11)
RDW: 13.7 % (ref 11.5–15.5)
WBC: 8.6 K/uL (ref 4.0–10.5)

## 2024-12-12 LAB — URINALYSIS, MICROSCOPIC ONLY

## 2024-12-12 LAB — TSH: TSH: 0.95 u[IU]/mL (ref 0.35–5.50)

## 2024-12-12 LAB — HEMOGLOBIN A1C: Hgb A1c MFr Bld: 5.5 % (ref 4.6–6.5)

## 2024-12-12 MED ORDER — HYDROCODONE-ACETAMINOPHEN 5-325 MG PO TABS: 1.0000 | ORAL_TABLET | Freq: Four times a day (QID) | ORAL | 0 refills | Status: AC | PRN

## 2024-12-12 MED ORDER — CLONAZEPAM 1 MG PO TABS: 0.5000 mg | ORAL_TABLET | Freq: Two times a day (BID) | ORAL | 0 refills | Status: AC | PRN

## 2024-12-12 MED ORDER — ALBUTEROL SULFATE HFA 108 (90 BASE) MCG/ACT IN AERS: 2.0000 | INHALATION_SPRAY | Freq: Four times a day (QID) | RESPIRATORY_TRACT | 1 refills | Status: AC | PRN

## 2024-12-12 MED ORDER — GABAPENTIN 100 MG PO CAPS: 200.0000 mg | ORAL_CAPSULE | Freq: Three times a day (TID) | ORAL | 11 refills | Status: AC

## 2024-12-12 NOTE — Telephone Encounter (Signed)
 Can we send the patient the office information through my chart please

## 2024-12-12 NOTE — Progress Notes (Signed)
 "  Established Patient Office Visit  Subjective   Patient ID: Heather Russo, female    DOB: 04/01/1966  Age: 59 y.o. MRN: [id number]  Chief Complaint  Patient presents with   Transitions Of Care   Medication Refill    Albuterol , Clonazepam , gabapentin , hydrocodone .     HPI  Discussed the use of AI scribe software for clinical note transcription with the patient, who gave verbal consent to proceed.  History of Present Illness Heather Russo is a 59 year old female with depression and anxiety who presents with unintentional weight loss.  She has experienced unintentional weight loss, decreasing from 132 pounds to 123 pounds over the past year. She has a lack of appetite, often eating only one substantial meal a day, but tends to overeat when she does eat. She frequently snacks and consumes high-calorie drinks like [employer] and tea. No stomach pain or issues with bowel movements, which occur every third day. She is concerned about the weight loss due to her family history of cancer.  She has a history of depression and anxiety and is currently taking venlafaxine  and Abilify . There has been no significant change in her symptoms since starting Abilify . She reports difficulty sleeping, a longstanding issue, and takes Abilify  in the morning to avoid sleep disturbances.  She has a history of smoking about a pack a day for the past 25 years and continues to smoke. She uses an albuterol  inhaler, which has reduced her need from daily use to about three times a week. No nighttime breathing issues or shortness of breath during the day.  Tolerating Advair  inhaler well  She is currently taking omeprazole  for reflux, which has been effective in managing her symptoms. No recent issues with reflux.  She has not seen a dermatologist in about six to seven months. Her last colonoscopy was in 2017, and she is due for another in 2027. She has not had a recent mammogram due to insurance issues, and her last Pap  smear was in 2022, which was negative.     Review of Systems  Constitutional:  Positive for weight loss. Negative for chills and fever.  Respiratory:  Negative for shortness of breath.   Cardiovascular:  Negative for chest pain.  Neurological:  Negative for headaches.  Psychiatric/Behavioral:  Negative for hallucinations and suicidal ideas.       Objective:     BP 112/82   Pulse 71   Temp 98 F (36.7 C) (Oral)   Ht 5' 0.5 (1.537 m)   Wt 123 lb 12.8 oz (56.2 kg)   LMP 07/24/2015   SpO2 98%   BMI 23.78 kg/m  BP Readings from Last 3 Encounters:  12/12/24 112/82  10/31/24 122/84  05/08/24 120/78   Wt Readings from Last 3 Encounters:  12/12/24 123 lb 12.8 oz (56.2 kg)  10/31/24 128 lb 9.6 oz (58.3 kg)  05/08/24 130 lb (59 kg)   SpO2 Readings from Last 3 Encounters:  12/12/24 98%  10/31/24 99%  05/08/24 96%      Physical Exam Vitals and nursing note reviewed.  Constitutional:      Appearance: Normal appearance.  Cardiovascular:     Rate and Rhythm: Normal rate and regular rhythm.     Heart sounds: Normal heart sounds.  Pulmonary:     Effort: Pulmonary effort is normal.     Breath sounds: Normal breath sounds.  Neurological:     Mental Status: She is alert.      No  results found for any visits on 12/12/24.    The 10-year ASCVD risk score (Arnett DK, et al., 2019) is: 7.1%    Assessment & Plan:   Problem List Items Addressed This Visit       Digestive   Esophageal dysphagia     Other   Chronic back pain - Primary   Relevant Medications   clonazePAM  (KLONOPIN ) 1 MG tablet   HYDROcodone -acetaminophen  (NORCO/VICODIN) 5-325 MG tablet   gabapentin  (NEURONTIN ) 100 MG capsule   Narcotic dependence (HCC)   Dyspnea on exertion   Relevant Medications   albuterol  (VENTOLIN  HFA) 108 (90 Base) MCG/ACT inhaler   Other Visit Diagnoses       Unintentional weight loss       Relevant Orders   CBC   Comprehensive metabolic panel with GFR   TSH    Hemoglobin A1c   CT CHEST ABDOMEN PELVIS W CONTRAST   Urine Microscopic     Screening mammogram for breast cancer       Relevant Orders   MM 3D SCREENING MAMMOGRAM BILATERAL BREAST      Assessment and Plan Assessment & Plan Unintentional weight loss Weight loss of 10 pounds over the past year. Differential includes malignancy due to smoking history. No appetite or exercise changes. Previous colonoscopy normal. Mammogram overdue. - Ordered CT scan of chest, abdomen, and pelvis to rule out malignancy. Patient has lost 7.5 percent body weight in less than a year - Obtained urine sample to check for blood. - Ordered blood work to assess underlying causes. - Consider GI referral if CT and blood work are normal. - Provided information for scheduling mammogram in [place].  Major depressive disorder and generalized anxiety disorder Continued depression and anxiety symptoms. Abilify  added without change. Psychiatry referral pending. Sleep disturbances noted. - Continue current doses of Abilify  and venlafaxine . - Followed up on psychiatry referral. - Advised taking Abilify  in the morning. - Scheduled follow-up in 6-8 weeks.  Gastroesophageal reflux disease GERD well-controlled with omeprazole . - Continue omeprazole  20 mg daily.  Dyspnea on exertion Improvement in dyspnea with reduced albuterol  use. No nocturnal or daytime shortness of breath. - Continue albuterol  inhaler as needed. - Ensure mouth rinsing after inhaler use.  Neck mass Noted. Previous ultrasound not completed. - Provided contact information for scheduling ultrasound.  Tobacco use disorder Continues to smoke one pack per day for 25 years. Increased risk for malignancy and other health issues. - Discussed risks of continued smoking and encouraged cessation.  General health maintenance Routine health maintenance discussed. Last colonoscopy normal. Mammogram overdue. Pap smear negative in 2022, due in 2027. -  Provided information for scheduling mammogram in [place]. - Ensure Pap smear is up to date, next due in 2027.  Return in about 6 weeks (around 01/23/2025) for weight recheck.    Adina Crandall, NP  "

## 2024-12-12 NOTE — Patient Instructions (Signed)
 Nice to see you today They will call you and set up the CT scan  Call and setup your mammogram at  [facility] [address] ( on hospital grounds) [place], [place]  [phone]   Follow up with me in 6 weeks

## 2024-12-12 NOTE — Telephone Encounter (Signed)
 Can we check on the psychiatry referral

## 2024-12-13 ENCOUNTER — Ambulatory Visit
Admission: RE | Admit: 2024-12-13 | Discharge: 2024-12-13 | Disposition: A | Discharge status: Home / Self Care | Source: Ambulatory Visit | Attending: Nurse Practitioner | Admitting: Nurse Practitioner

## 2024-12-13 DIAGNOSIS — R634 Abnormal weight loss: Secondary | ICD-10-CM | POA: Diagnosis present

## 2024-12-13 MED ORDER — IOHEXOL 300 MG/ML  SOLN
85.0000 mL | Freq: Once | INTRAMUSCULAR | Status: AC | PRN
  Administered 2024-12-13: 85 mL via INTRAVENOUS

## 2024-12-13 MED ORDER — IOHEXOL 9 MG/ML PO SOLN
500.0000 mL | ORAL | Status: AC
  Administered 2024-12-13 (×2): 500 mL via ORAL

## 2024-12-14 ENCOUNTER — Ambulatory Visit: Payer: Self-pay | Admitting: Nurse Practitioner

## 2024-12-14 DIAGNOSIS — R634 Abnormal weight loss: Secondary | ICD-10-CM

## 2024-12-14 DIAGNOSIS — I700 Atherosclerosis of aorta: Secondary | ICD-10-CM

## 2024-12-19 MED ORDER — ROSUVASTATIN CALCIUM 10 MG PO TABS: 10.0000 mg | ORAL_TABLET | Freq: Every day | ORAL | 0 refills | Status: AC

## 2024-12-19 NOTE — Telephone Encounter (Signed)
-----   Message from [facility] T sent at 12/18/2024  4:30 PM EST ----- Called patient reviewed all information and repeated back to me. Will call if any questions.   Pt would like to start cholesterol medication Pt prefers Cardiologist and GI for [place] area ----- Message ----- From: Wendee Lynwood HERO, NP Sent: 12/16/2024   9:15 AM EST To: Wendee Gunnels  Notified via My Chart   See below   If she agrees with the cholesterol medication she will need a 3 month fasting lab follow up

## 2024-12-19 NOTE — Telephone Encounter (Signed)
 Medication ordered and referral placed

## 2025-01-28 ENCOUNTER — Ambulatory Visit: Admitting: Nurse Practitioner

## 2025-03-18 ENCOUNTER — Other Ambulatory Visit
